# Patient Record
Sex: Female | Born: 1940 | Race: White | Hispanic: No | Smoking: Former smoker
Health system: Southern US, Community
[De-identification: ages and names within clinical notes are randomized; demographics above are authoritative.]

## PROBLEM LIST (undated history)

## (undated) DIAGNOSIS — I10 Essential (primary) hypertension: Secondary | ICD-10-CM

## (undated) DIAGNOSIS — H3552 Pigmentary retinal dystrophy: Secondary | ICD-10-CM

## (undated) DIAGNOSIS — G4700 Insomnia, unspecified: Secondary | ICD-10-CM

## (undated) DIAGNOSIS — G4730 Sleep apnea, unspecified: Secondary | ICD-10-CM

## (undated) DIAGNOSIS — K589 Irritable bowel syndrome without diarrhea: Secondary | ICD-10-CM

## (undated) DIAGNOSIS — E785 Hyperlipidemia, unspecified: Secondary | ICD-10-CM

## (undated) DIAGNOSIS — T7840XA Allergy, unspecified, initial encounter: Secondary | ICD-10-CM

## (undated) DIAGNOSIS — E538 Deficiency of other specified B group vitamins: Secondary | ICD-10-CM

## (undated) DIAGNOSIS — F329 Major depressive disorder, single episode, unspecified: Secondary | ICD-10-CM

## (undated) DIAGNOSIS — F419 Anxiety disorder, unspecified: Secondary | ICD-10-CM

## (undated) DIAGNOSIS — F32A Depression, unspecified: Secondary | ICD-10-CM

## (undated) DIAGNOSIS — G459 Transient cerebral ischemic attack, unspecified: Secondary | ICD-10-CM

## (undated) DIAGNOSIS — G2581 Restless legs syndrome: Secondary | ICD-10-CM

## (undated) DIAGNOSIS — G629 Polyneuropathy, unspecified: Secondary | ICD-10-CM

## (undated) DIAGNOSIS — M1990 Unspecified osteoarthritis, unspecified site: Secondary | ICD-10-CM

## (undated) DIAGNOSIS — K219 Gastro-esophageal reflux disease without esophagitis: Secondary | ICD-10-CM

## (undated) HISTORY — PX: OTHER SURGICAL HISTORY: SHX169

## (undated) HISTORY — PX: TOTAL ABDOMINAL HYSTERECTOMY: SHX209

## (undated) HISTORY — DX: Hyperlipidemia, unspecified: E78.5

## (undated) HISTORY — PX: LEFT OOPHORECTOMY: SHX1961

## (undated) HISTORY — DX: Allergy, unspecified, initial encounter: T78.40XA

## (undated) HISTORY — DX: Pigmentary retinal dystrophy: H35.52

## (undated) HISTORY — DX: Gastro-esophageal reflux disease without esophagitis: K21.9

## (undated) HISTORY — DX: Essential (primary) hypertension: I10

## (undated) HISTORY — DX: Anxiety disorder, unspecified: F41.9

## (undated) HISTORY — DX: Restless legs syndrome: G25.81

## (undated) HISTORY — DX: Insomnia, unspecified: G47.00

## (undated) HISTORY — DX: Deficiency of other specified B group vitamins: E53.8

## (undated) HISTORY — DX: Transient cerebral ischemic attack, unspecified: G45.9

## (undated) HISTORY — DX: Sleep apnea, unspecified: G47.30

## (undated) HISTORY — DX: Depression, unspecified: F32.A

## (undated) HISTORY — DX: Irritable bowel syndrome, unspecified: K58.9

## (undated) HISTORY — DX: Polyneuropathy, unspecified: G62.9

## (undated) HISTORY — DX: Major depressive disorder, single episode, unspecified: F32.9

## (undated) HISTORY — DX: Unspecified osteoarthritis, unspecified site: M19.90

---

## 1998-06-21 HISTORY — PX: CHOLECYSTECTOMY: SHX55

## 2003-07-02 ENCOUNTER — Ambulatory Visit (HOSPITAL_COMMUNITY): Admission: RE | Admit: 2003-07-02 | Discharge: 2003-07-02 | Payer: Self-pay | Admitting: Internal Medicine

## 2004-09-10 ENCOUNTER — Ambulatory Visit: Payer: Self-pay | Admitting: Internal Medicine

## 2004-12-07 ENCOUNTER — Ambulatory Visit: Payer: Self-pay | Admitting: Internal Medicine

## 2006-01-20 ENCOUNTER — Ambulatory Visit: Payer: Self-pay | Admitting: Internal Medicine

## 2010-09-22 ENCOUNTER — Ambulatory Visit (INDEPENDENT_AMBULATORY_CARE_PROVIDER_SITE_OTHER): Payer: Medicare Other | Admitting: Urgent Care

## 2010-09-22 ENCOUNTER — Encounter: Payer: Self-pay | Admitting: Urgent Care

## 2010-09-22 VITALS — BP 123/77 | HR 73 | Temp 98.8°F | Ht 62.0 in | Wt 193.0 lb

## 2010-09-22 DIAGNOSIS — K219 Gastro-esophageal reflux disease without esophagitis: Secondary | ICD-10-CM

## 2010-09-22 DIAGNOSIS — K589 Irritable bowel syndrome without diarrhea: Secondary | ICD-10-CM

## 2010-09-22 LAB — CBC
MCH: 29.8
MCHC: 34
MPV: 8.4
platelet count: 298

## 2010-09-22 LAB — COMPREHENSIVE METABOLIC PANEL
ALT: 20 U/L (ref 7–35)
Albumin: 4.1
Anion Gap: 11 mmol/L (ref ?–30)
CO2: 29 mmol/L
Creat: 0.75
Glucose: 95
HDL: 47 mg/dL (ref 35–70)
Potassium: 4.3 mmol/L
Sodium: 143 mmol/L (ref 137–147)
Total Bilirubin: 1.3 mg/dL
Triglycerides: 94

## 2010-09-22 NOTE — Patient Instructions (Signed)
Start ALIGN once daily Start BENEFIBER as directed Use MIRALAX 17 grams as needed if no BM in 2 days    Irritable Bowel Syndrome (Spastic Colon) Irritable Bowel Syndrome (IBS) is caused by a disturbance of normal bowel function. Other terms used are spastic colon, mucous colitis, and irritable colon. It does not require surgery, nor does it lead to cancer. There is no cure for IBS. But with proper diet, stress reduction, and medication, you will find that your problems (symptoms) will gradually disappear or improve. IBS is a common digestive disorder. It usually appears in late adolescence or early adulthood. Women develop it twice as often as men. CAUSES After food has been digested and absorbed in the small intestine, waste material is moved into the colon (large intestine). In the colon, water and salts are absorbed from the undigested products coming from the small intestine. The remaining residue, or fecal material, is held for elimination. Under normal circumstances, gentle, rhythmic contractions on the bowel walls push the fecal material along the colon towards the rectum. In IBS, however, these contractions are irregular and poorly coordinated. The fecal material is either retained too long, resulting in constipation, or expelled too soon, producing diarrhea. SYMPTOMS  The most common symptom of IBS is pain. It is typically in the lower left side of the belly (abdomen). But it may occur anywhere in the abdomen. It can be felt as heartburn, backache, or even as a dull pain in the arms or shoulders. The pain comes from excessive bowel-muscle spasms and from the buildup of gas and fecal material in the colon. This pain:  Can range from sharp belly (abdominal) cramps to a dull, continuous ache.   Usually worsens soon after eating.   Is typically relieved by having a bowel movement or passing gas.  Abdominal pain is usually accompanied by constipation. But it may also produce diarrhea. The  diarrhea typically occurs right after a meal or upon arising in the morning. The stools are typically soft and watery. They are often flecked with secretions (mucus). Other symptoms of IBS include:  Bloating.  Loss of appetite.   Heartburn.  Feeling sick to your stomach  (nausea).   Belching  Vomiting   Gas.  IBS may also cause a number of symptoms that are unrelated to the digestive system:  Fatigue.  Headaches.   Anxiety  Shortness of breath   Difficulty in concentrating.  Dizziness.   These symptoms tend to come and go. DIAGNOSIS The symptoms of IBS closely mimic the symptoms of other, more serious digestive disorders. So your caregiver may wish to perform a variety of additional tests to exclude these disorders. He/she wants to be certain of learning what is wrong (diagnosis). The nature and purpose of each test will be explained to you. TREATMENT A number of medications are available to help correct bowel function and/or relieve bowel spasms and abdominal pain. Among the drugs available are:  Mild, non-irritating laxatives for severe constipation and to help restore normal bowel habits.   Specific anti-diarrheal medications to treat severe or prolonged diarrhea.   Anti-spasmodic agents to relieve intestinal cramps.   Your caregiver may also decide to treat you with a mild tranquilizer or sedative during unusually stressful periods in your life.  The important thing to remember is that if any drug is prescribed for you, make sure that you take it exactly as directed. Make sure that your caregiver knows how well it worked for you. HOME CARE INSTRUCTIONS  Avoid foods that are high in fat or oils. Some examples WGN:FAOZH cream, butter, frankfurters, sausage, and other fatty meats.   Avoid foods that have a laxative effect, such as fruit, fruit juice, and dairy products.   Cut out carbonated drinks, chewing gum, and "gassy" foods, such as beans and cabbage. This may help  relieve bloating and belching.   Bran taken with plenty of liquids may help relieve constipation.   Keep track of what foods seem to trigger your symptoms.   Avoid emotionally charged situations or circumstances that produce anxiety.   Start or continue exercising.   Get plenty of rest and sleep.  MAKE SURE YOU:   Understand these instructions.   Will watch your condition.   Will get help right away if you are not doing well or get worse.  Document Released: 06/07/2005 Document Re-Released: 10/24/2008 [facility] Patient Information 2011 [place], [place].Diet & IBS  (Irritable Bowel Syndrome) No cure has been found for IBS. Many options are available to treat the symptoms. Your caregivers will give you the best treatments available for your symptoms. They will also encourage you to manage stress and make changes to your diet. You need to work with your doctor and Registered Dietician to find the best combination of medicine, diet, counseling, and support to control your symptoms. The following are some diet suggestions. SOME FOOD MAKE IBS WORSE These foods are:  Fatty foods like french fries.   Milk products like cheese or ice cream.   Chocolate.   Alcohol.   Caffeine (found in coffee and some sodas).   Carbonated drinks like soda.  If certain foods cause symptoms, you should eat less of them or stop eating them. CAREFUL EATING REDUCES IBS SYMPTOMS.   Before changing your diet, keep a journal of the foods that seem to cause distress. Then discuss your findings with your caregivers. Write down this information:   What you are eating during the day and when.   What problems are you having after eating?   When do the symptoms occur in relation to your meals?   What foods always make you feel badly?   Take your notes with you to the caregiver to see if you should stop eating certain foods.  SOME FOODS MAKE IBS BETTER. Fiber reduces IBS symptoms, especially constipation,  because it makes stools soft, bulky, and easier to pass. Fiber is found in bran, bread, cereal, beans, fruit, and vegetables.  Some examples of foods with fiber:  Fruits.   Apples.   Peaches.   Pears.   Berries.   Figs.    Vegetables.   Broccoli, raw.   Cabbage.   Carrots.  Raw peas.    Breads, cereals, and beans.   Kidney beans.  Lima beans.   Whole-grain bread.  Whole-grain cereal.    Add foods with fiber to your diet a little at a time. This will let your body get used to them. Too much fiber at once might cause gas and swelling of your belly (abdomen). This can trigger symptoms in a person with IBS.   Caregivers usually recommend a diet with enough fiber to produce soft, painless bowel movements. High-fiber diets may cause gas and bloating. These symptoms often go away within a few weeks as your body adjusts. In many cases, dietary fiber may lessen IBS symptoms, particularly constipation. However, it may not help pain or diarrhea. High-fiber diets keep the colon mildly distended (enlarged with the added fiber). This may help prevent spasms  in the colon. Some forms of fiber also keep water in the stool, thereby preventing hard stools that are difficult to pass.   Besides telling you to eat more foods with fiber, your caregiver may also tell you to get more fiber by taking a fiber pill or drinking water mixed with a special high-fiber powder. An example of this is a natural fiber laxative containing psyllium seed.  HOW MUCH YOU EAT IS IMPORTANT:  Large meals can cause cramping and diarrhea in people with IBS.   If this happens to you, try eating 4 or 5 small meals a day.   Or, have your usual 3 meals, but eat less at each meal.   It may also help if your meals are low in fat and high in carbohydrates. Examples of carbohydrates are pasta, rice, whole-grain breads and cereals, fruits and vegetables.   If dairy products cause your symptoms to flare up, you can try eating  less of those foods.   You might be able to handle yogurt better than other dairy products because it contains bacteria that helps with digestion.   Dairy products are an important source of calcium and other nutrients. If you need to avoid dairy products, be sure to talk with a Registered Dietitian about getting these nutrients through other food sources.   Drinking 6 to 8 glasses of plain water a day is important, especially if you have diarrhea.  *You may want to consult a Registered Dietitian who can help you make changes to your diet.  FOR MORE INFORMATION [employer] [address] [place], [place] [address] Email: iffgd@iffgd .org  Internet: www.iffgd.org  National Digestive Diseases Information Clearinghouse (NIDDK) [address] [place], [place] [address] Email: nddic@info .[employer].si  Document Released: 08/28/2003 Document Re-Released: 11/25/2009 [facility] Patient Information 2011 [place], [place].

## 2010-09-22 NOTE — Assessment & Plan Note (Signed)
Well controlled on omeprazole 20mg daily.  °

## 2010-09-22 NOTE — Assessment & Plan Note (Signed)
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 We discussed her symptoms in great detail today and I detect no alarm features. Given these findings, would proceed with conservative treatment for IBS. If she is no better we'll consider further evaluation at that time. Last colonoscopy and EGD 2005. There is no family history of GI malignancy.   Treatment with aligned probiotic, MiraLax as needed for no bowel in it in 2 days, and daily fiber in the way of Benefiber. Instructions given for IBS diet and IBS literature for her review. Will followup in 6 weeks to assess her progress.

## 2010-09-22 NOTE — Progress Notes (Signed)
Referring Provider: [facility] Primary Care Physician:  Kirstie Peri, MD  Chief Complaint  Patient presents with  . Irritable Bowel Syndrome  . Gastrophageal Reflux    HPI:  Anna Jacobs is a 70 y.o. female here as a referral from Dr. Sherryll Burger for IBS/GERD.  "Every time I eat food settles in my upper abd".  C/o "tightness" in abd, within 15 mins "like dysentery" with watery stools 2-3 times.  C/o pruritis from hemorrhoids.  Alternates w/ constipation w/ BM q 2-3 days.  Some straining w/ stools.  Denies rectal bleeding, melena or mucous in stools.  Weight stable.  Some anorexia due to fear of having to go to bathroom.  Quit taking miralax daily because of loose stools & felt like ot was irritating stomach.  C/o transient nausea before or after eating without emesis.  Denies fever/chills.  Taking omeprazole 20mg  daily x 1 mo.  Denies heartburn, indigestion, but has had some transient chest pain.  Denies alarm features. Last colonoscopy and EGD by Dr. Jena Gauss January 2005.   Past Medical History  Diagnosis Date  . Parkinson's disease 2001  . Restless leg syndrome   . RP (retinitis pigmentosa)   . Anxiety   . Sleep apnea   . Hearing loss   . Hyperlipemia   . Hypertension   . Insomnia   . Degenerative joint disease   . Peripheral neuropathy   . B12 deficiency   . Depression   . Urinary incontinence   . TIA (transient ischemic attack)   . GERD (gastroesophageal reflux disease)     EGD 07/02/2003 Dr. Jena Gauss  . IBS (irritable bowel syndrome)     Colonoscopy normal Dr. Jena Gauss 06/2003    Past Surgical History  Procedure Date  . Cholecystectomy 2000  . Total abdominal hysterectomy   . Benign breast biopsy   . Left oophorectomy     Current Outpatient Prescriptions  Medication Sig Dispense Refill  . ALPRAZolam (XANAX) 0.5 MG tablet Take 1 mg by mouth 2 (two) times daily as needed.        Marland Kitchen atorvastatin (LIPITOR) 40 MG tablet Take 40 mg by mouth daily.        . carbidopa-levodopa (SINEMET) 25-100  MG per tablet Take 1 tablet by mouth 2 (two) times daily as needed.        Marland Kitchen escitalopram (LEXAPRO) 20 MG tablet Take 20 mg by mouth daily.        . meclizine (ANTIVERT) 12.5 MG tablet Take 12.5 mg by mouth 3 (three) times daily as needed.        Marland Kitchen omeprazole (PRILOSEC) 20 MG capsule Take 20 mg by mouth daily.        . polyethylene glycol (MIRALAX / GLYCOLAX) packet Take 17 g by mouth daily as needed.          Allergies as of 09/22/2010 - Review Complete 09/22/2010  Allergen Reaction Noted  . Flagyl (metronidazole hcl) Itching and Rash 09/22/2010    Family History  Problem Relation Age of Onset  . Lung cancer Brother   . Ovarian cancer Daughter 41  . Lymphoma Mother   . Coronary artery disease Father    History   Social History  . Marital Status: Divorced    Spouse Name: N/A    Number of Children: 3  . Years of Education: N/A   Occupational History  . disabled     Previously worked at [employer] History Main Topics  . Smoking status: Never Smoker   .  Smokeless tobacco: Never Used  . Alcohol Use: No  . Drug Use: No  . Sexually Active: No   Review of Systems: Gen: Denies any fever, chills, sweats, anorexia, fatigue, weakness, malaise, weight loss, and sleep disorder CV: Denies chest pain, angina, palpitations, syncope, orthopnea, PND, peripheral edema, and claudication. Resp: Denies dyspnea at rest, dyspnea with exercise, cough, sputum, wheezing, coughing up blood, and pleurisy. GI: Denies vomiting blood, jaundice, and fecal incontinence.   Denies dysphagia or odynophagia. GU : Denies urinary burning, blood in urine, urinary frequency, urinary hesitancy, nocturnal urination, and urinary incontinence. MS: Denies joint pain, limitation of movement, and swelling, stiffness, low back pain, extremity pain. Denies muscle weakness, cramps, atrophy.  Derm: Denies rash, itching, dry skin, hives, moles, warts, or unhealing ulcers.  Psych: Denies depression, anxiety, memory  loss, suicidal ideation, hallucinations, paranoia, and confusion. Heme: Denies bruising, bleeding, and enlarged lymph nodes.  Physical Exam: BP 123/77  Pulse 73  Temp 98.8 F (37.1 C)  Ht 5\' 2"  (1.575 m)  Wt 193 lb (87.544 kg)  BMI 35.30 kg/m2  SpO2 96% General:   Alert,  Well-developed, well-nourished, pleasant and cooperative in NAD Head:  Normocephalic and atraumatic. Eyes:  Sclera clear, no icterus.   Conjunctiva pink. Ears:  Normal auditory acuity. Nose:  No deformity, discharge,  or lesions. Mouth:  No deformity or lesions, dentition normal. Neck:  Supple; no masses or thyromegaly. Lungs:  Clear throughout to auscultation.   No wheezes, crackles, or rhonchi. No acute distress. Heart:  Regular rate and rhythm; no murmurs, clicks, rubs,  or gallops. Abdomen:  Obese. Soft, nontender and nondistended. No masses, hepatosplenomegaly or hernias noted. Normal bowel sounds, without guarding, and without rebound.   Rectal:  Deferred until time of colonoscopy.   Msk:  Symmetrical without gross deformities. Normal posture. Pulses:  Normal pulses noted. Extremities:  Without clubbing or edema. Neurologic:  Alert and  oriented x4;  grossly normal neurologically. Skin:  Intact without significant lesions or rashes. Cervical Nodes:  No significant cervical adenopathy. Psych:  Alert and cooperative. Normal mood and affect.  Labs reviewed from [facility] on 08/24/10 Lidoderm 1.3 otherwise normal LFTs, normal CMP except BUN 22, CO2 29, an anion gap of 11.3. Normal TSH. Normal CBC.

## 2010-10-16 ENCOUNTER — Encounter: Payer: Self-pay | Admitting: Urgent Care

## 2010-11-06 ENCOUNTER — Ambulatory Visit: Payer: Medicare Other | Admitting: Internal Medicine

## 2010-12-04 ENCOUNTER — Ambulatory Visit (INDEPENDENT_AMBULATORY_CARE_PROVIDER_SITE_OTHER): Payer: Medicare Other | Admitting: Internal Medicine

## 2010-12-04 ENCOUNTER — Encounter: Payer: Self-pay | Admitting: Internal Medicine

## 2010-12-04 VITALS — BP 118/76 | HR 83 | Temp 98.0°F | Ht 62.0 in | Wt 184.8 lb

## 2010-12-04 DIAGNOSIS — K589 Irritable bowel syndrome without diarrhea: Secondary | ICD-10-CM

## 2010-12-04 DIAGNOSIS — K582 Mixed irritable bowel syndrome: Secondary | ICD-10-CM

## 2010-12-04 NOTE — Progress Notes (Signed)
Primary Care Physician:  Kirstie Peri, MD  Primary Gastroenterologist:  Dr. Jena Gauss  Chief Complaint  Patient presents with  . Follow-up    trouble going to the bathroom    HPI:  Anna Jacobs is a 70 y.o. female here for followup of irritable  bowel syndrome symptoms. The patient notes wide swings in bowel function. She states she has multiple episodes of diarrhea about a week and a half out of every month. She could not afford probiotic therapy. She does take Benefiber 1 tablespoon daily. She tells me she has been following the label instructions on her prescription MiraLax exactly. She reports these instructions include 2 capfuls of MiraLax every day. Consequently, she has been taking MiraLax every day without fail - even during periods of diarrhea. She has not had any rectal bleeding. Negative colonoscopy 2005. No family history of CRC. She may particularly have episodes of diarrhea when shegoes to the grocery store and  he goes out to eat. At other times, she may get 3-4 days without a bowel movement.  Past Medical History  Diagnosis Date  . Parkinson's disease 2001  . Restless leg syndrome   . RP (retinitis pigmentosa)   . Anxiety   . Sleep apnea   . Hearing loss   . Hyperlipemia   . Hypertension   . Insomnia   . Degenerative joint disease   . Peripheral neuropathy   . B12 deficiency   . Depression   . Urinary incontinence   . TIA (transient ischemic attack)   . GERD (gastroesophageal reflux disease)     EGD 07/02/2003 Dr. Jena Gauss  . IBS (irritable bowel syndrome)     Colonoscopy normal Dr. Jena Gauss 06/2003    Past Surgical History  Procedure Date  . Cholecystectomy 2000  . Total abdominal hysterectomy   . Benign breast biopsy   . Left oophorectomy     Current Outpatient Prescriptions  Medication Sig Dispense Refill  . ALPRAZolam (XANAX) 0.5 MG tablet Take 1 mg by mouth 2 (two) times daily as needed.        Marland Kitchen atorvastatin (LIPITOR) 40 MG tablet Take 40 mg by mouth daily.         . carbidopa-levodopa (SINEMET) 25-100 MG per tablet Take 1 tablet by mouth 2 (two) times daily as needed.        Marland Kitchen escitalopram (LEXAPRO) 20 MG tablet Take 20 mg by mouth daily.        . meclizine (ANTIVERT) 12.5 MG tablet Take 12.5 mg by mouth 3 (three) times daily as needed.        Marland Kitchen omeprazole (PRILOSEC) 20 MG capsule Take 20 mg by mouth daily.        . polyethylene glycol (MIRALAX / GLYCOLAX) packet Take 17 g by mouth daily as needed.          Allergies as of 12/04/2010 - Review Complete 12/04/2010  Allergen Reaction Noted  . Flagyl (metronidazole hcl) Itching and Rash 09/22/2010    Family History  Problem Relation Age of Onset  . Lung cancer Brother   . Ovarian cancer Daughter 83  . Lymphoma Mother   . Coronary artery disease Father     History   Social History  . Marital Status: Divorced    Spouse Name: N/A    Number of Children: 3  . Years of Education: N/A   Occupational History  . disabled     Previously worked at [employer] History Main Topics  . Smoking  status: Never Smoker   . Smokeless tobacco: Never Used  . Alcohol Use: No  . Drug Use: No  . Sexually Active: No   Other Topics Concern  . Not on file   Social History Narrative  . No narrative on file      ROS:  General: Negative for anorexia, weight loss, fever, chills, fatigue, weakness. Eyes: Negative for vision changes.  ENT: Negative for hoarseness, difficulty swallowing , nasal congestion. CV: Negative for chest pain, angina, palpitations, dyspnea on exertion, peripheral edema.  Respiratory: Negative for dyspnea at rest, dyspnea on exertion, cough, sputum, wheezing.  GI: See history of present illness. GU:  Negative for dysuria, hematuria, urinary incontinence, urinary frequency, nocturnal urination.  MS: Negative for joint pain, low back pain.  Derm: Negative for rash or itching.  Neuro: Negative for weakness, abnormal sensation, seizure, frequent headaches, memory loss,  confusion.  Psych: Negative for anxiety, depression, suicidal ideation, hallucinations.  Endo: Negative for unusual weight change.  Heme: Negative for bruising or bleeding. Allergy: Negative for rash or hives.    Physical Examination: BP 118/76  Pulse 83  Temp(Src) 98 F (36.7 C) (Temporal)  Ht 5\' 2"  (1.575 m)  Wt 184 lb 12.8 oz (83.825 kg)  BMI 33.80 kg/m2   General: Well-nourished, well-developed in no acute distress.  Head: Normocephalic, atraumatic.   Eyes: Conjunctiva pink, no icterus. Mouth: Oropharyngeal mucosa moist and pink , no lesions erythema or exudate. Neck: Supple without thyromegaly, masses, or lymphadenopathy.  Lungs: Clear to auscultation bilaterally.  Heart: Regular rate and rhythm, no murmurs rubs or gallops.  Abdomen: Bowel sounds are normal, nontender, nondistended, no hepatosplenomegaly or masses, no abdominal bruits or    hernia , no rebound or guarding.   Extremities: No lower extremity edema.  Neuro: Alert and oriented x 4 , grossly normal neurologically.  Skin: Warm and dry, no rash or jaundice.   Psych: Alert and cooperative, normal mood and affect.

## 2010-12-04 NOTE — Assessment & Plan Note (Signed)
Very pleasant 70 year old lady with classic alternating patient consist with irritable bowel syndrome. She has as much constipation as diarrhea. She is taking MiraLax as a scheduled medication even through periods of diarrhea. I do not detect any alarm symptoms.  Recommendations: I spent a lengthy period of time going over the benign chronic nature of irritable bowel syndrome with this nice lady  I made the following specific recommendations: Take only one capful of MiraLax daily at bedtime when necessary any given day without a bowel movement. She has a reasonably good bowel movement he given day she should leave MiraLax off. I've asked her to continue Benefiber 1 tablespoon daily. Since she cannot afford a probiotic therapy and for criteria for, I've recommended a serving of yogurt every day for a long hall.  Finally, I recommended this nicely keep a stool diary.. She states there are times when she knows she's going to have diarrhea such as going out to eat or going to the grocery store.  I suggested she take a single Imodium right ear and how for these activities.  Plan see this nice lady back in the office in 8-10 weeks for followup.

## 2010-12-11 ENCOUNTER — Encounter: Payer: Self-pay | Admitting: Internal Medicine

## 2011-01-29 ENCOUNTER — Ambulatory Visit: Payer: Medicare Other | Admitting: Gastroenterology

## 2011-01-29 ENCOUNTER — Ambulatory Visit: Payer: Medicare Other | Admitting: Urgent Care

## 2011-02-01 ENCOUNTER — Ambulatory Visit: Payer: Medicare Other | Admitting: Gastroenterology

## 2012-06-20 ENCOUNTER — Other Ambulatory Visit: Payer: Self-pay | Admitting: Internal Medicine

## 2012-06-20 DIAGNOSIS — R921 Mammographic calcification found on diagnostic imaging of breast: Secondary | ICD-10-CM

## 2012-07-03 ENCOUNTER — Ambulatory Visit
Admission: RE | Admit: 2012-07-03 | Discharge: 2012-07-03 | Disposition: A | Discharge status: Home / Self Care | Payer: PRIVATE HEALTH INSURANCE | Source: Ambulatory Visit | Attending: Internal Medicine | Admitting: Internal Medicine

## 2012-07-03 DIAGNOSIS — R921 Mammographic calcification found on diagnostic imaging of breast: Secondary | ICD-10-CM

## 2012-10-26 ENCOUNTER — Telehealth: Payer: Self-pay | Admitting: General Practice

## 2012-10-26 NOTE — Telephone Encounter (Signed)
Patient called to schedule tcs, however she is having some problems and will need an ov.

## 2012-11-21 ENCOUNTER — Ambulatory Visit (INDEPENDENT_AMBULATORY_CARE_PROVIDER_SITE_OTHER): Payer: PRIVATE HEALTH INSURANCE | Admitting: Gastroenterology

## 2012-11-21 ENCOUNTER — Encounter: Payer: Self-pay | Admitting: Gastroenterology

## 2012-11-21 VITALS — BP 136/82 | HR 70 | Temp 98.2°F | Ht 62.0 in | Wt 168.0 lb

## 2012-11-21 DIAGNOSIS — R143 Flatulence: Secondary | ICD-10-CM

## 2012-11-21 DIAGNOSIS — R159 Full incontinence of feces: Secondary | ICD-10-CM | POA: Insufficient documentation

## 2012-11-21 DIAGNOSIS — R197 Diarrhea, unspecified: Secondary | ICD-10-CM

## 2012-11-21 DIAGNOSIS — R109 Unspecified abdominal pain: Secondary | ICD-10-CM

## 2012-11-21 DIAGNOSIS — R198 Other specified symptoms and signs involving the digestive system and abdomen: Secondary | ICD-10-CM

## 2012-11-21 DIAGNOSIS — R140 Abdominal distension (gaseous): Secondary | ICD-10-CM

## 2012-11-21 DIAGNOSIS — R194 Change in bowel habit: Secondary | ICD-10-CM

## 2012-11-21 DIAGNOSIS — R1030 Lower abdominal pain, unspecified: Secondary | ICD-10-CM | POA: Insufficient documentation

## 2012-11-21 MED ORDER — PEG 3350-KCL-NA BICARB-NACL 420 G PO SOLR: 4000.0000 mL | ORAL | Status: DC

## 2012-11-21 NOTE — Assessment & Plan Note (Signed)
72 year old lady with diagnosis of irritable bowel syndrome who presents today with complaints of worsening of her symptoms. She would like to proceed with her colonoscopy at this time as it has been nearly 10 years since her last one. She describes having more frequent episodes of postprandial fecal urgency and incontinence. Less constipation than before. Recalls several years ago that she never had any of these problems. Did not appreciate any improvement with Benefiber and has subsequently come off the medication. Tried Activia for a period of time without any results. Currently takes MiraLax only when she needs it. Rarely has to take it. She is concerned she may have Crohn's disease because her sister-in-law has a family member with it. Planes a lower abdominal pain associated with her bowel movements. Complains of abdominal bloating after meals. Typical GERD symptoms well controlled on Prilosec.  1. Check for celiac disease via TTG. 2. Ileocolonoscopy with random colon biopsies in the near future with Dr. Jena Gauss.  I have discussed the risks, alternatives, benefits with regards to but not limited to the risk of reaction to medication, bleeding, infection, perforation and the patient is agreeable to proceed. Written consent to be obtained. 3. Evaluate hemorrhoids at time of procedure to see if she may be a candidate for banding in the near future. Dr. Jena Gauss to make recommendations at her colonoscopy. 4. Consider antispasmodic after procedure based on findings.

## 2012-11-21 NOTE — Progress Notes (Signed)
CC PCP 

## 2012-11-21 NOTE — Patient Instructions (Addendum)
1. Please have your blood work done today. 2. We have scheduled you for a colonoscopy with Dr. Jena Gauss. Please see separate instructions.

## 2012-11-21 NOTE — Progress Notes (Signed)
Primary Care Physician:  Kirstie Peri, MD  Primary Gastroenterologist:  Roetta Sessions, MD   Chief Complaint  Patient presents with  . Colonoscopy    HPI:  Anna Jacobs is a 72 y.o. female here to schedule colonoscopy. She was last seen in June 2012. Negative colonoscopy in 2005. No family history of colon cancer. Personal history of irritable bowel syndrome.   Doesn't feel like Activia or Miralax help. Stopped Benefiber inadvertently, states it didn't seem to help.   PP fecal urgency with periods of incontinence. Afraid to eat and afraid to go out. Leaks stool with any significant activity. Rare normal BM. Sometimes constipation, almost equal, really depends on what eats. Used to have just normal bowel movements until several years ago. Recent severe diarrhea with milkshake. Drink milk with cereal and seems to do okay. Backed off on dark sodas. No melena, brbpr but eyesight so bad. No heartburn on Prilosec. No dysphagia. Lots of lower abdominal discomfort, bloating. Hemorrhoid pads. Sitzs baths. Hemorrhoids come out after BM. Has to lay on side after BM sometimes due to severe hemorrhoid pain.   Current Outpatient Prescriptions  Medication Sig Dispense Refill  . ALPRAZolam (XANAX) 0.5 MG tablet Take 1 mg by mouth 2 (two) times daily as needed.        Marland Kitchen atorvastatin (LIPITOR) 40 MG tablet Take 40 mg by mouth daily.        . carbidopa-levodopa (SINEMET) 25-100 MG per tablet Take 1 tablet by mouth 2 (two) times daily as needed.        Marland Kitchen escitalopram (LEXAPRO) 20 MG tablet Take 20 mg by mouth daily.        Marland Kitchen omeprazole (PRILOSEC) 20 MG capsule Take 20 mg by mouth daily.        . polyethylene glycol (MIRALAX / GLYCOLAX) packet Take 17 g by mouth daily as needed.         No current facility-administered medications for this visit.    Allergies as of 11/21/2012 - Review Complete 11/21/2012  Allergen Reaction Noted  . Flagyl (metronidazole hcl) Itching and Rash 09/22/2010    Past Medical  History  Diagnosis Date  . Parkinson's disease 2001  . Restless leg syndrome   . RP (retinitis pigmentosa)   . Anxiety   . Sleep apnea   . Hearing loss   . Hyperlipemia   . Hypertension   . Insomnia   . Degenerative joint disease   . Peripheral neuropathy   . B12 deficiency   . Depression   . Urinary incontinence   . TIA (transient ischemic attack)   . GERD (gastroesophageal reflux disease)     EGD 07/02/2003 Dr. Jena Gauss  . IBS (irritable bowel syndrome)     Colonoscopy normal Dr. Jena Gauss 06/2003    Past Surgical History  Procedure Laterality Date  . Cholecystectomy  2000  . Total abdominal hysterectomy    . Benign breast biopsy      left (remotely) and right (06/2012)  . Left oophorectomy      Family History  Problem Relation Age of Onset  . Lung cancer Brother   . Ovarian cancer Daughter 67  . Lymphoma Mother   . Coronary artery disease Father   . Colon cancer Neg Hx     History   Social History  . Marital Status: Divorced    Spouse Name: N/A    Number of Children: 3  . Years of Education: N/A   Occupational History  . disabled  Previously worked at [employer] History Main Topics  . Smoking status: Never Smoker   . Smokeless tobacco: Never Used  . Alcohol Use: No  . Drug Use: No  . Sexually Active: No   Other Topics Concern  . Not on file   Social History Narrative  . No narrative on file      ROS:  General: Negative for anorexia, weight loss, fever, chills, fatigue, weakness. Eyes: chronic poor eye sight.  ENT: Negative for hoarseness, difficulty swallowing , nasal congestion. CV: Negative for chest pain, angina, palpitations, dyspnea on exertion, peripheral edema.  Respiratory: Negative for dyspnea at rest, dyspnea on exertion, cough, sputum, wheezing.  GI: See history of present illness. GU:  Negative for dysuria, hematuria, urinary incontinence, urinary frequency, nocturnal urination.  MS: Negative for joint pain, low back pain.   Derm: Negative for rash or itching.  Neuro: Negative for weakness, abnormal sensation, seizure, frequent headaches, memory loss, confusion.  Psych: Negative for anxiety, depression, suicidal ideation, hallucinations.  Endo: Negative for unusual weight change.  Heme: Negative for bruising or bleeding. Allergy: Negative for rash or hives.    Physical Examination:  BP 136/82  Pulse 70  Temp(Src) 98.2 F (36.8 C) (Oral)  Ht 5\' 2"  (1.575 m)  Wt 168 lb (76.204 kg)  BMI 30.72 kg/m2   General: Well-nourished, well-developed in no acute distress.  Head: Normocephalic, atraumatic.   Eyes: Conjunctiva pink, no icterus. Mouth: Oropharyngeal mucosa moist and pink , no lesions erythema or exudate. Neck: Supple without thyromegaly, masses, or lymphadenopathy.  Lungs: Clear to auscultation bilaterally.  Heart: Regular rate and rhythm, no murmurs rubs or gallops.  Abdomen: Bowel sounds are normal, vague mild lower abdominal tenderness, nondistended, no hepatosplenomegaly or masses, no abdominal bruits or    hernia , no rebound or guarding.   Rectal: defer Extremities: No lower extremity edema. No clubbing or deformities.  Neuro: Alert and oriented x 4 , grossly normal neurologically.  Skin: Warm and dry, no rash or jaundice.   Psych: Alert and cooperative, normal mood and affect.

## 2012-11-22 ENCOUNTER — Encounter (HOSPITAL_COMMUNITY): Payer: Self-pay | Admitting: Pharmacy Technician

## 2012-11-29 ENCOUNTER — Encounter (HOSPITAL_COMMUNITY): Payer: Self-pay | Admitting: *Deleted

## 2012-11-29 ENCOUNTER — Encounter (HOSPITAL_COMMUNITY)
Admission: RE | Disposition: A | Discharge status: Home / Self Care | Payer: Self-pay | Source: Ambulatory Visit | Attending: Internal Medicine

## 2012-11-29 ENCOUNTER — Ambulatory Visit (HOSPITAL_COMMUNITY)
Admission: RE | Admit: 2012-11-29 | Discharge: 2012-11-29 | Disposition: A | Discharge status: Home / Self Care | Payer: PRIVATE HEALTH INSURANCE | Source: Ambulatory Visit | Attending: Internal Medicine | Admitting: Internal Medicine

## 2012-11-29 DIAGNOSIS — K620 Anal polyp: Secondary | ICD-10-CM

## 2012-11-29 DIAGNOSIS — D128 Benign neoplasm of rectum: Secondary | ICD-10-CM | POA: Insufficient documentation

## 2012-11-29 DIAGNOSIS — R197 Diarrhea, unspecified: Secondary | ICD-10-CM

## 2012-11-29 DIAGNOSIS — R140 Abdominal distension (gaseous): Secondary | ICD-10-CM

## 2012-11-29 DIAGNOSIS — R194 Change in bowel habit: Secondary | ICD-10-CM

## 2012-11-29 DIAGNOSIS — D126 Benign neoplasm of colon, unspecified: Secondary | ICD-10-CM | POA: Insufficient documentation

## 2012-11-29 DIAGNOSIS — G20A1 Parkinson's disease without dyskinesia, without mention of fluctuations: Secondary | ICD-10-CM | POA: Insufficient documentation

## 2012-11-29 DIAGNOSIS — D129 Benign neoplasm of anus and anal canal: Secondary | ICD-10-CM | POA: Insufficient documentation

## 2012-11-29 DIAGNOSIS — R159 Full incontinence of feces: Secondary | ICD-10-CM

## 2012-11-29 DIAGNOSIS — I10 Essential (primary) hypertension: Secondary | ICD-10-CM | POA: Insufficient documentation

## 2012-11-29 DIAGNOSIS — G20 Parkinson's disease: Secondary | ICD-10-CM | POA: Insufficient documentation

## 2012-11-29 DIAGNOSIS — K621 Rectal polyp: Secondary | ICD-10-CM

## 2012-11-29 HISTORY — PX: BIOPSY: SHX5522

## 2012-11-29 HISTORY — PX: COLONOSCOPY: SHX5424

## 2012-11-29 SURGERY — COLONOSCOPY
Anesthesia: Moderate Sedation

## 2012-11-29 MED ORDER — SODIUM CHLORIDE 0.9 % IV SOLN
INTRAVENOUS | Status: DC
  Administered 2012-11-29: 09:00:00 via INTRAVENOUS

## 2012-11-29 MED ORDER — ONDANSETRON HCL 4 MG/2ML IJ SOLN
INTRAMUSCULAR | Status: AC
  Filled 2012-11-29: qty 2

## 2012-11-29 MED ORDER — MIDAZOLAM HCL 5 MG/5ML IJ SOLN
INTRAMUSCULAR | Status: DC | PRN
  Administered 2012-11-29: 1 mg via INTRAVENOUS
  Administered 2012-11-29: 2 mg via INTRAVENOUS

## 2012-11-29 MED ORDER — MIDAZOLAM HCL 5 MG/5ML IJ SOLN
INTRAMUSCULAR | Status: AC
  Filled 2012-11-29: qty 10

## 2012-11-29 MED ORDER — MEPERIDINE HCL 100 MG/ML IJ SOLN
INTRAMUSCULAR | Status: DC | PRN
  Administered 2012-11-29: 50 mg via INTRAVENOUS
  Administered 2012-11-29: 25 mg via INTRAVENOUS

## 2012-11-29 MED ORDER — STERILE WATER FOR IRRIGATION IR SOLN
Status: DC | PRN
  Administered 2012-11-29: 10:00:00

## 2012-11-29 MED ORDER — ONDANSETRON HCL 4 MG/2ML IJ SOLN
INTRAMUSCULAR | Status: DC | PRN
  Administered 2012-11-29: 4 mg via INTRAVENOUS

## 2012-11-29 MED ORDER — MEPERIDINE HCL 100 MG/ML IJ SOLN
INTRAMUSCULAR | Status: AC
  Filled 2012-11-29: qty 1

## 2012-11-29 NOTE — Op Note (Signed)
[facility] [address] [place], [address]   COLONOSCOPY PROCEDURE REPORT  PATIENT: Anna, Jacobs  MR#:         [id number] BIRTHDATE: [birth year] , 71  yrs. old GENDER: Female ENDOSCOPIST: R.  Roetta Sessions, MD FACP FACG REFERRED BY:  Kirstie Peri, M.D. PROCEDURE DATE:  11/29/2012 PROCEDURE:     Ileocolonoscopy with segmental biopsy  INDICATIONS: Chronic diarrhea; colorectal cancer screening  INFORMED CONSENT:  The risks, benefits, alternatives and imponderables including but not limited to bleeding, perforation as well as the possibility of a missed lesion have been reviewed.  The potential for biopsy, lesion removal, etc. have also been discussed.  Questions have been answered.  All parties agreeable. Please see the history and physical in the medical record for more information.  MEDICATIONS: Versed 3 mg IV and Demerol 75 mg IV in divided doses Zofran 4 mg  DESCRIPTION OF PROCEDURE:  After a digital rectal exam was performed, the EC-[id number] ([id number])  colonoscope was advanced from the anus through the rectum and colon to the area of the cecum, ileocecal valve and appendiceal orifice.  The cecum was deeply intubated.  These structures were well-seen and photographed for the record.  From the level of the cecum and ileocecal valve, the scope was slowly and cautiously withdrawn.  The mucosal surfaces were carefully surveyed utilizing scope tip deflection to facilitate fold flattening as needed.  The scope was pulled down into the rectum where a thorough examination including retroflexion was performed.    FINDINGS:  Adequate preparation. (2) diminutive polyps inside the anal verge at 5 cm; otherwise, the remainder of the colonic mucosa appeared normal. The distal 5 cm of terminal ileal mucosa appeared normal.  THERAPEUTIC / DIAGNOSTIC MANEUVERS PERFORMED:  Segmental  biopsies of the ascending and sigmoid segments were taken to evaluate  for microscopic colitis. The  2 diminutive polyps in the rectum were cold biopsied/removed. COMPLICATIONS: None  CECAL WITHDRAWAL TIME:   11 minutes  IMPRESSION:  Diminutive rectal polyps-removed as described above. Otherwise normal examination-status post segmental biopsy  RECOMMENDATIONS: Followup on pathology.   _______________________________ eSigned:  R. Roetta Sessions, MD FACP [facility] 11/29/2012 11:08 AM   CC:

## 2012-11-29 NOTE — H&P (View-Only) (Signed)
Primary Care Physician:  SHAH,ASHISH, MD  Primary Gastroenterologist:  Michael Rourk, MD   Chief Complaint  Patient presents with  . Colonoscopy    HPI:  Anna Jacobs is a 71 y.o. female here to schedule colonoscopy. She was last seen in June 2012. Negative colonoscopy in 2005. No family history of colon cancer. Personal history of irritable bowel syndrome.   Doesn't feel like Activia or Miralax help. Stopped Benefiber inadvertently, states it didn't seem to help.   PP fecal urgency with periods of incontinence. Afraid to eat and afraid to go out. Leaks stool with any significant activity. Rare normal BM. Sometimes constipation, almost equal, really depends on what eats. Used to have just normal bowel movements until several years ago. Recent severe diarrhea with milkshake. Drink milk with cereal and seems to do okay. Backed off on dark sodas. No melena, brbpr but eyesight so bad. No heartburn on Prilosec. No dysphagia. Lots of lower abdominal discomfort, bloating. Hemorrhoid pads. Sitzs baths. Hemorrhoids come out after BM. Has to lay on side after BM sometimes due to severe hemorrhoid pain.   Current Outpatient Prescriptions  Medication Sig Dispense Refill  . ALPRAZolam (XANAX) 0.5 MG tablet Take 1 mg by mouth 2 (two) times daily as needed.        . atorvastatin (LIPITOR) 40 MG tablet Take 40 mg by mouth daily.        . carbidopa-levodopa (SINEMET) 25-100 MG per tablet Take 1 tablet by mouth 2 (two) times daily as needed.        . escitalopram (LEXAPRO) 20 MG tablet Take 20 mg by mouth daily.        . omeprazole (PRILOSEC) 20 MG capsule Take 20 mg by mouth daily.        . polyethylene glycol (MIRALAX / GLYCOLAX) packet Take 17 g by mouth daily as needed.         No current facility-administered medications for this visit.    Allergies as of 11/21/2012 - Review Complete 11/21/2012  Allergen Reaction Noted  . Flagyl (metronidazole hcl) Itching and Rash 09/22/2010    Past Medical  History  Diagnosis Date  . Parkinson's disease 2001  . Restless leg syndrome   . RP (retinitis pigmentosa)   . Anxiety   . Sleep apnea   . Hearing loss   . Hyperlipemia   . Hypertension   . Insomnia   . Degenerative joint disease   . Peripheral neuropathy   . B12 deficiency   . Depression   . Urinary incontinence   . TIA (transient ischemic attack)   . GERD (gastroesophageal reflux disease)     EGD 07/02/2003 Dr. Rourk  . IBS (irritable bowel syndrome)     Colonoscopy normal Dr. Rourk 06/2003    Past Surgical History  Procedure Laterality Date  . Cholecystectomy  2000  . Total abdominal hysterectomy    . Benign breast biopsy      left (remotely) and right (06/2012)  . Left oophorectomy      Family History  Problem Relation Age of Onset  . Lung cancer Brother   . Ovarian cancer Daughter 32  . Lymphoma Mother   . Coronary artery disease Father   . Colon cancer Neg Hx     History   Social History  . Marital Status: Divorced    Spouse Name: N/A    Number of Children: 3  . Years of Education: N/A   Occupational History  . disabled       Previously worked at [employer]   Social History Main Topics  . Smoking status: Never Smoker   . Smokeless tobacco: Never Used  . Alcohol Use: No  . Drug Use: No  . Sexually Active: No   Other Topics Concern  . Not on file   Social History Narrative  . No narrative on file      ROS:  General: Negative for anorexia, weight loss, fever, chills, fatigue, weakness. Eyes: chronic poor eye sight.  ENT: Negative for hoarseness, difficulty swallowing , nasal congestion. CV: Negative for chest pain, angina, palpitations, dyspnea on exertion, peripheral edema.  Respiratory: Negative for dyspnea at rest, dyspnea on exertion, cough, sputum, wheezing.  GI: See history of present illness. GU:  Negative for dysuria, hematuria, urinary incontinence, urinary frequency, nocturnal urination.  MS: Negative for joint pain, low back pain.   Derm: Negative for rash or itching.  Neuro: Negative for weakness, abnormal sensation, seizure, frequent headaches, memory loss, confusion.  Psych: Negative for anxiety, depression, suicidal ideation, hallucinations.  Endo: Negative for unusual weight change.  Heme: Negative for bruising or bleeding. Allergy: Negative for rash or hives.    Physical Examination:  BP 136/82  Pulse 70  Temp(Src) 98.2 F (36.8 C) (Oral)  Ht 5' 2" (1.575 m)  Wt 168 lb (76.204 kg)  BMI 30.72 kg/m2   General: Well-nourished, well-developed in no acute distress.  Head: Normocephalic, atraumatic.   Eyes: Conjunctiva pink, no icterus. Mouth: Oropharyngeal mucosa moist and pink , no lesions erythema or exudate. Neck: Supple without thyromegaly, masses, or lymphadenopathy.  Lungs: Clear to auscultation bilaterally.  Heart: Regular rate and rhythm, no murmurs rubs or gallops.  Abdomen: Bowel sounds are normal, vague mild lower abdominal tenderness, nondistended, no hepatosplenomegaly or masses, no abdominal bruits or    hernia , no rebound or guarding.   Rectal: defer Extremities: No lower extremity edema. No clubbing or deformities.  Neuro: Alert and oriented x 4 , grossly normal neurologically.  Skin: Warm and dry, no rash or jaundice.   Psych: Alert and cooperative, normal mood and affect.    

## 2012-11-29 NOTE — Progress Notes (Signed)
Quick Note:    Celiac negative  ______

## 2012-11-29 NOTE — Interval H&P Note (Signed)
History and Physical Interval Note:  11/29/2012 10:10 AM  Anna Jacobs  has presented today for surgery, with the diagnosis of ABDOMINAL BLOATING, IBS, LOWER ABD PAIN, BOWEL CHANGES  The various methods of treatment have been discussed with the patient and family. After consideration of risks, benefits and other options for treatment, the patient has consented to  Procedure(s) with comments: COLONOSCOPY (N/A) - 9:45/ WITH TERMINAL ILEOSCOPY BIOPSY (N/A) - RANDOM BIOPSY as a surgical intervention .  The patient's history has been reviewed, patient examined, no change in status, stable for surgery.  I have reviewed the patient's chart and labs.  Questions were answered to the patient's satisfaction.     [name]  Celiac screen negative. Ileocolonoscopy with segmental biopsies at a minimum now being performed.   The risks, benefits, limitations, alternatives and imponderables have been reviewed with the patient. Questions have been answered. All parties are agreeable.

## 2012-12-01 ENCOUNTER — Encounter (HOSPITAL_COMMUNITY): Payer: Self-pay | Admitting: Internal Medicine

## 2012-12-03 ENCOUNTER — Encounter: Payer: Self-pay | Admitting: Internal Medicine

## 2012-12-05 NOTE — Progress Notes (Signed)
Cc PCP 

## 2012-12-05 NOTE — Progress Notes (Unsigned)
Letter from: Corbin Ade  Send letter to patient.  Send copy of letter with path to referring provider and PCP.  Offer f/u appt w extender extender re:  Diarrhea wn next month

## 2012-12-06 ENCOUNTER — Encounter: Payer: Self-pay | Admitting: Internal Medicine

## 2012-12-06 NOTE — Progress Notes (Signed)
Pt is aware of OV on 01/01/13 at 10 with LSL and appt card was mailed

## 2013-01-01 ENCOUNTER — Encounter: Payer: Self-pay | Admitting: Gastroenterology

## 2013-01-01 ENCOUNTER — Ambulatory Visit (INDEPENDENT_AMBULATORY_CARE_PROVIDER_SITE_OTHER): Payer: PRIVATE HEALTH INSURANCE | Admitting: Gastroenterology

## 2013-01-01 VITALS — BP 113/77 | HR 70 | Temp 98.2°F | Ht 62.0 in | Wt 170.4 lb

## 2013-01-01 DIAGNOSIS — K589 Irritable bowel syndrome without diarrhea: Secondary | ICD-10-CM

## 2013-01-01 NOTE — Progress Notes (Signed)
Primary Care Physician: Kirstie Peri, MD  Primary Gastroenterologist:  Roetta Sessions, MD   Chief Complaint  Patient presents with  . Follow-up    HPI: Anna Jacobs is a 72 y.o. female here for followup of recent colonoscopy. She has a history of irritable bowel syndrome. Seen in the office on 11/21/2012 for postprandial fecal urgency with periods of incontinence. Complained of lower abdominal bloating. Complained of hemorrhoids. Did not appreciate treatment of her bowel function with Benefiber or Activia. She had negative celiac serologies. Recent ileocolonoscopy with segmental biopsies was unremarkable except for rectal tubular adenomas.  Normal BMS lately. Feels good right now. No issues with hemorrhoids lately. No abdominal pain, cramps. No heartburn. No dysphagia. Appetite good. Lots of questions about diet and IBS.  Current Outpatient Prescriptions  Medication Sig Dispense Refill  . ALPRAZolam (XANAX) 0.5 MG tablet Take 1 mg by mouth 2 (two) times daily as needed.        Marland Kitchen atorvastatin (LIPITOR) 40 MG tablet Take 40 mg by mouth daily.        . carbidopa-levodopa (SINEMET) 25-100 MG per tablet Take 1 tablet by mouth 2 (two) times daily as needed.        Marland Kitchen escitalopram (LEXAPRO) 20 MG tablet Take 20 mg by mouth daily.        Marland Kitchen omeprazole (PRILOSEC) 20 MG capsule Take 20 mg by mouth daily.         No current facility-administered medications for this visit.    Allergies as of 01/01/2013 - Review Complete 01/01/2013  Allergen Reaction Noted  . Flagyl (metronidazole hcl) Itching and Rash 09/22/2010    ROS:  General: Negative for anorexia, weight loss, fever, chills, fatigue, weakness. ENT: Negative for hoarseness, difficulty swallowing , nasal congestion. CV: Negative for chest pain, angina, palpitations, dyspnea on exertion, peripheral edema.  Respiratory: Negative for dyspnea at rest, dyspnea on exertion, cough, sputum, wheezing.  GI: See history of present illness. GU:   Negative for dysuria, hematuria, urinary incontinence, urinary frequency, nocturnal urination.  Endo: Negative for unusual weight change.    Physical Examination:   BP 113/77  Pulse 70  Temp(Src) 98.2 F (36.8 C) (Oral)  Ht 5\' 2"  (1.575 m)  Wt 170 lb 7.2 oz (77.316 kg)  BMI 31.17 kg/m2  General: Well-nourished, well-developed in no acute distress.  Eyes: No icterus. Mouth: Oropharyngeal mucosa moist and pink , no lesions erythema or exudate. Lungs: Clear to auscultation bilaterally.  Heart: Regular rate and rhythm, no murmurs rubs or gallops.  Abdomen: Bowel sounds are normal, nontender, nondistended, no hepatosplenomegaly or masses, no abdominal bruits or hernia , no rebound or guarding.   Extremities: No lower extremity edema. No clubbing or deformities. Neuro: Alert and oriented x 4   Skin: Warm and dry, no jaundice.   Psych: Alert and cooperative, normal mood and affect.

## 2013-01-01 NOTE — Patient Instructions (Addendum)
Take Miralax 1/2 to one capful on days you do not have a bowel movement.  Eat Activia or another Dannon yogurt product once daily for 3-4 weeks and cycle every few months.  Office visit as needed.    Diet and Irritable Bowel Syndrome  No cure has been found for irritable bowel syndrome (IBS). Many options are available to treat the symptoms. Your caregiver will give you the best treatments available for your symptoms. He or she will also encourage you to manage stress and to make changes to your diet. You need to work with your caregiver and Registered Dietician to find the best combination of medicine, diet, counseling, and support to control your symptoms. The following are some diet suggestions. FOODS THAT MAKE IBS WORSE  Fatty foods, such as [place] fries.  Milk products, such as cheese or ice cream.  Chocolate.  Alcohol.  Caffeine (found in coffee and some sodas).  Carbonated drinks, such as soda. If certain foods cause symptoms, you should eat less of them or stop eating them. FOOD JOURNAL   Keep a journal of the foods that seem to cause distress. Write down:  What you are eating during the day and when.  What problems you are having after eating.  When the symptoms occur in relation to your meals.  What foods always make you feel badly.  Take your notes with you to your caregiver to see if you should stop eating certain foods. FOODS THAT MAKE IBS BETTER Fiber reduces IBS symptoms, especially constipation, because it makes stools soft, bulky, and easier to pass. Fiber is found in bran, bread, cereal, beans, fruit, and vegetables. Examples of foods with fiber include:  Apples.  Peaches.  Pears.  Berries.  Figs.  Broccoli, raw.  Cabbage.  Carrots.  Raw peas.  Kidney beans.  Lima beans.  Whole-grain bread.  Whole-grain cereal. Add foods with fiber to your diet a little at a time. This will let your body get used to them. Too much fiber at once might  cause gas and swelling of your abdomen. This can trigger symptoms in a person with IBS. Caregivers usually recommend a diet with enough fiber to produce soft, painless bowel movements. High fiber diets may cause gas and bloating. However, these symptoms often go away within a few weeks, as your body adjusts. In many cases, dietary fiber may lessen IBS symptoms, particularly constipation. However, it may not help pain or diarrhea. High fiber diets keep the colon mildly enlarged (distended) with the added fiber. This may help prevent spasms in the colon. Some forms of fiber also keep water in the stool, thereby preventing hard stools that are difficult to pass.  Besides telling you to eat more foods with fiber, your caregiver may also tell you to get more fiber by taking a fiber pill or drinking water mixed with a special high fiber powder. An example of this is a natural fiber laxative containing psyllium seed.  TIPS  Large meals can cause cramping and diarrhea in people with IBS. If this happens to you, try eating 4 or 5 small meals a day, or try eating less at each of your usual 3 meals. It may also help if your meals are low in fat and high in carbohydrates. Examples of carbohydrates are pasta, rice, whole-grain breads and cereals, fruits, and vegetables.  If dairy products cause your symptoms to flare up, you can try eating less of those foods. You might be able to handle yogurt better than  other dairy products, because it contains bacteria that helps with digestion. Dairy products are an important source of calcium and other nutrients. If you need to avoid dairy products, be sure to talk with a Registered Dietitian about getting these nutrients through other food sources.  Drink enough water and fluids to keep your urine clear or pale yellow. This is important, especially if you have diarrhea. FOR MORE INFORMATION  International Foundation for Functional Gastrointestinal Disorders: www.iffgd.org    National Digestive Diseases Information Clearinghouse: digestive.StageSync.si Document Released: 08/28/2003 Document Revised: 08/30/2011 Document Reviewed: 05/15/2007 [facility] Patient Information 2014 [place], [place].

## 2013-01-01 NOTE — Assessment & Plan Note (Signed)
Doing very well at this time. Actually some solid stools and days without BMs since her colonoscopy. Spend quite a bit of time discussing IBS with patient. Discussed dietary factors. Went over all of her results which appeared to provide her with some piece of mind.  She will take Miralax 1/2 to one capful on days she does not have a bowel movement.  She will take Activia or another Dannon yogurt product once daily for 3-4 weeks and cycle every few months.  IBS diet literature discussed and provided to patient.  OV prn.

## 2013-01-01 NOTE — Progress Notes (Signed)
Cc PCP 

## 2013-06-22 NOTE — Progress Notes (Signed)
REVIEWED.  

## 2017-10-04 ENCOUNTER — Other Ambulatory Visit (HOSPITAL_BASED_OUTPATIENT_CLINIC_OR_DEPARTMENT_OTHER): Payer: Self-pay

## 2017-10-04 DIAGNOSIS — G4733 Obstructive sleep apnea (adult) (pediatric): Secondary | ICD-10-CM

## 2017-10-16 ENCOUNTER — Ambulatory Visit: Payer: Medicare Other | Attending: Neurology | Admitting: Neurology

## 2017-10-16 DIAGNOSIS — G4733 Obstructive sleep apnea (adult) (pediatric): Secondary | ICD-10-CM | POA: Diagnosis not present

## 2017-10-23 NOTE — Procedures (Signed)
  Morton A. Merlene Laughter, MD     [email or web address]             NOCTURNAL POLYSOMNOGRAPHY   LOCATION: [facility]  Patient Name: Anna Jacobs, Anna Jacobs Date: 10/16/2017 Gender: Female D.O.B: [birth year] Age (years): 58 Referring Provider: Phillips Odor MD, ABSM Height (inches): 62 Interpreting Physician: Phillips Odor MD, ABSM Weight (lbs): 170 RPSGT: Peak, Robert BMI: 31 MRN: [id number] Neck Size: 15.00 CLINICAL INFORMATION Sleep Study Type: NPSG     Indication for sleep study: N/A     Epworth Sleepiness Score: 5     SLEEP STUDY TECHNIQUE As per the AASM Manual for the Scoring of Sleep and Associated Events v2.3 (April 2016) with a hypopnea requiring 4% desaturations.  The channels recorded and monitored were frontal, central and occipital EEG, electrooculogram (EOG), submentalis EMG (chin), nasal and oral airflow, thoracic and abdominal wall motion, anterior tibialis EMG, snore microphone, electrocardiogram, and pulse oximetry.  MEDICATIONS Medications self-administered by patient taken the night of the study : N/A  Current Outpatient Medications:  .  ALPRAZolam (XANAX) 0.5 MG tablet, Take 1 mg by mouth 2 (two) times daily as needed.  , Disp: , Rfl:  .  atorvastatin (LIPITOR) 40 MG tablet, Take 40 mg by mouth daily.  , Disp: , Rfl:  .  carbidopa-levodopa (SINEMET) 25-100 MG per tablet, Take 1 tablet by mouth 2 (two) times daily as needed.  , Disp: , Rfl:  .  escitalopram (LEXAPRO) 20 MG tablet, Take 20 mg by mouth daily.  , Disp: , Rfl:  .  omeprazole (PRILOSEC) 20 MG capsule, Take 20 mg by mouth daily.  , Disp: , Rfl:      SLEEP ARCHITECTURE The study was initiated at 9:44:09 PM and ended at 5:00:50 AM.  Sleep onset time was 55.9 minutes and the sleep efficiency was 61.2%%. The total sleep time was 267.3 minutes.  Stage REM latency was 254.0 minutes.  The patient spent 10.8%% of the night in stage N1 sleep, 67.5%% in stage N2 sleep, 6.0%% in  stage N3 and 15.71% in REM.  Alpha intrusion was absent.  Supine sleep was 31.61%.  RESPIRATORY PARAMETERS The overall apnea/hypopnea index (AHI) was 9.0 per hour. There were 15 total apneas, including 10 obstructive, 3 central and 2 mixed apneas. There were 25 hypopneas and 122 RERAs.  The AHI during Stage REM sleep was 8.6 per hour.  AHI while supine was 13.5 per hour.  The mean oxygen saturation was 93.5%. The minimum SpO2 during sleep was 87.0%.  moderate snoring was noted during this study.  CARDIAC DATA The 2 lead EKG demonstrated sinus rhythm. The mean heart rate was 66.3 beats per minute. Other EKG findings include: None. LEG MOVEMENT DATA The total PLMS were 0 with a resulting PLMS index of 0.0. Associated arousal with leg movement index was 0.0.  IMPRESSIONS Mild obstructive sleep apnea is documented during this recording.  However, the severity does not require positive pressure treatment .   Delano Metz, MD Diplomate, American Board of Sleep Medicine.  ELECTRONICALLY SIGNED ON:  10/23/2017, 5:10 PM [place] PH: [phone]   FX: [phone] [place]

## 2018-06-02 ENCOUNTER — Ambulatory Visit (INDEPENDENT_AMBULATORY_CARE_PROVIDER_SITE_OTHER): Payer: Medicare Other | Admitting: Urology

## 2018-06-02 DIAGNOSIS — N3941 Urge incontinence: Secondary | ICD-10-CM

## 2018-07-12 ENCOUNTER — Ambulatory Visit (INDEPENDENT_AMBULATORY_CARE_PROVIDER_SITE_OTHER): Payer: Medicare Other | Admitting: Urology

## 2018-07-12 DIAGNOSIS — N3941 Urge incontinence: Secondary | ICD-10-CM | POA: Diagnosis not present

## 2019-01-03 ENCOUNTER — Ambulatory Visit (INDEPENDENT_AMBULATORY_CARE_PROVIDER_SITE_OTHER): Payer: Medicare Other | Admitting: Urology

## 2019-01-03 DIAGNOSIS — N3941 Urge incontinence: Secondary | ICD-10-CM | POA: Diagnosis not present

## 2019-01-10 ENCOUNTER — Encounter: Payer: Self-pay | Admitting: Gastroenterology

## 2019-02-13 ENCOUNTER — Encounter: Payer: Self-pay | Admitting: Gastroenterology

## 2019-02-13 ENCOUNTER — Ambulatory Visit (INDEPENDENT_AMBULATORY_CARE_PROVIDER_SITE_OTHER): Payer: Medicare Other | Admitting: Gastroenterology

## 2019-02-13 ENCOUNTER — Encounter (INDEPENDENT_AMBULATORY_CARE_PROVIDER_SITE_OTHER): Payer: Self-pay

## 2019-02-13 VITALS — BP 120/68 | HR 64 | Temp 98.5°F | Ht 62.0 in | Wt 163.0 lb

## 2019-02-13 DIAGNOSIS — K219 Gastro-esophageal reflux disease without esophagitis: Secondary | ICD-10-CM | POA: Diagnosis not present

## 2019-02-13 DIAGNOSIS — R1310 Dysphagia, unspecified: Secondary | ICD-10-CM

## 2019-02-13 MED ORDER — FAMOTIDINE 20 MG PO TABS: 20.0000 mg | ORAL_TABLET | Freq: Every day | ORAL | 3 refills | Status: DC

## 2019-02-13 NOTE — Addendum Note (Signed)
Addended by: Wyline Beady on: 02/13/2019 11:04 AM   Modules accepted: Orders

## 2019-02-13 NOTE — Progress Notes (Signed)
HPI: This is a very pleasant 78 year old woman who was referred to me by Monico Blitz, MD  to evaluate dysphasia, GERD.    Chief complaint is GERD, dysphasia  Looks like she has been a long-term patient of [facility], Dr. Gala Romney.  Last office note that I can see was about 6 years ago there.  I am meeting her for the first time today.  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  She has had troubles with mucus in her throat and esophagus for many years.  Over the past year she has had gradually increasing difficulty swallowing pills and food.  She has never had to regurgitate food or pills but has to clear her throat several times to get it to go down.  She has no troubles with liquids.  She does have some sour belches and burning in her esophagus at times.  She was taking Prilosec for this however those pills are giving her troubles and so she has been off of them for weeks it sounds like.  Her weight is overall stable.  She has had no overt GI bleeding.     Review of systems: Pertinent positive and negative review of systems were noted in the above HPI section. All other review negative.   Past Medical History:  Diagnosis Date  . Anxiety   . B12 deficiency   . Degenerative joint disease   . Depression   . GERD (gastroesophageal reflux disease)    EGD 07/02/2003 Dr. Gala Romney  . Hearing loss   . Hyperlipemia   . Hypertension   . IBS (irritable bowel syndrome)    Colonoscopy normal Dr. Gala Romney 06/2003  . Insomnia   . Parkinson's disease 2001  . Peripheral neuropathy   . Restless leg syndrome   . RP (retinitis pigmentosa)   . Sleep apnea   . TIA (transient ischemic attack)   . Urinary incontinence     Past Surgical History:  Procedure Laterality Date  . Benign breast biopsy     left (remotely) and right (06/2012)  . BIOPSY N/A 11/29/2012   Procedure: BIOPSY;  Surgeon: Daneil Dolin, MD;  Location: [facility];  Service: Endoscopy;   Laterality: N/A;  RANDOM BIOPSY  . CHOLECYSTECTOMY  2000  . COLONOSCOPY N/A 11/29/2012   [id number] rectal polyps-removed Otherwise normal examination distal terminal ileum appeared normal. Random colon biopsies were negative for microscopic colitis. Rectum polyp, tubular adenoma. Next TCS 11/2019.  Marland Kitchen LEFT OOPHORECTOMY    . TOTAL ABDOMINAL HYSTERECTOMY      Current Outpatient Medications  Medication Sig Dispense Refill  . ALPRAZolam (XANAX) 0.5 MG tablet Take 1 mg by mouth 2 (two) times daily as needed.      Marland Kitchen atorvastatin (LIPITOR) 40 MG tablet Take 40 mg by mouth daily.      . carbidopa-levodopa (SINEMET) 25-100 MG per tablet Take 1 tablet by mouth 2 (two) times daily as needed.      . diclofenac sodium (VOLTAREN) 1 % GEL Apply topically 2 (two) times daily as needed.    Marland Kitchen escitalopram (LEXAPRO) 20 MG tablet Take 20 mg by mouth daily.      Marland Kitchen MYRBETRIQ 50 MG TB24 tablet Take 50 mg by mouth daily.    . traMADol (ULTRAM) 50 MG tablet Take 1 tablet by mouth as needed.    Marland Kitchen omeprazole (PRILOSEC) 20 MG capsule Take 20 mg by mouth daily.       No current facility-administered  medications for this visit.     Allergies as of 02/13/2019 - Review Complete 02/13/2019  Allergen Reaction Noted  . Flagyl [metronidazole hcl] Itching and Rash 09/22/2010    Family History  Problem Relation Age of Onset  . Coronary artery disease Father   . Lymphoma Mother   . Lung cancer Brother   . Ovarian cancer Daughter 80  . Colon cancer Neg Hx     Social History   Socioeconomic History  . Marital status: Divorced    Spouse name: Not on file  . Number of children: 3  . Years of education: Not on file  . Highest education level: Not on file  Occupational History  . Occupation: disabled    Comment: Previously worked at [employer]  . Financial resource strain: Not on file  . Food insecurity    Worry: Not on file    Inability: Not on file  . Transportation needs    Medical: Not on  file    Non-medical: Not on file  Tobacco Use  . Smoking status: Never Smoker  . Smokeless tobacco: Never Used  Substance and Sexual Activity  . Alcohol use: No  . Drug use: No  . Sexual activity: Not Currently  Lifestyle  . Physical activity    Days per week: Not on file    Minutes per session: Not on file  . Stress: Not on file  Relationships  . Social Herbalist on phone: Not on file    Gets together: Not on file    Attends religious service: Not on file    Active member of club or organization: Not on file    Attends meetings of clubs or organizations: Not on file    Relationship status: Not on file  . Intimate partner violence    Fear of current or ex partner: Not on file    Emotionally abused: Not on file    Physically abused: Not on file    Forced sexual activity: Not on file  Other Topics Concern  . Not on file  Social History Narrative  . Not on file     Physical Exam: BP 120/68   Pulse 64   Temp 98.5 F (36.9 C) (Oral)   Ht 5\' 2"  (1.575 m)   Wt 163 lb (73.9 kg)   BMI 29.81 kg/m  Constitutional: generally well-appearing, blind, has a cane Psychiatric: alert and oriented x3 Eyes: extraocular movements intact Mouth: oral pharynx moist, no lesions Neck: supple no lymphadenopathy Cardiovascular: heart regular rate and rhythm Lungs: clear to auscultation bilaterally Abdomen: soft, nontender, nondistended, no obvious ascites, no peritoneal signs, normal bowel sounds Extremities: no lower extremity edema bilaterally Skin: no lesions on visible extremities   Assessment and plan: 78 y.o. female with GERD, dysphasia  I do think she has at least a component of acid regurg that is causing some of her troubles.  She is not able to swallow omeprazole, Prilosec and so for now I am going to put her on Pepcid, famotidine 20 mg twice daily.  I recommended an upper endoscopy at her soonest convenience.  This might be peptic stricturing.  I doubt she has a  neoplasm since her weight is been going up and her symptoms have been going on for at least a year or maybe even more than that.    Please see the "Patient Instructions" section for addition details about the plan.   Owens Loffler, MD [facility] 02/13/2019, 10:32  AM  Cc: Monico Blitz, MD

## 2019-02-13 NOTE — Patient Instructions (Addendum)
Use Famotidine 20 mg daily.   You have been scheduled for an endoscopy. Please follow written instructions given to you at your visit today. If you use inhalers (even only as needed), please bring them with you on the day of your procedure.

## 2019-02-19 ENCOUNTER — Encounter: Payer: Medicare Other | Admitting: Gastroenterology

## 2019-02-22 ENCOUNTER — Telehealth: Payer: Self-pay

## 2019-02-22 NOTE — Telephone Encounter (Signed)
Returning call. Answered "NO" to all COVID 19 screening questions.  Reminder her to check in on the 4th floor. That care partner may wait in the car or upstairs in lobby and to be sure that both are wearing a mask when they enter the building.

## 2019-02-22 NOTE — Telephone Encounter (Signed)
Covid-19 screening questions   Do you now or have you had a fever in the last 14 days?  Do you have any respiratory symptoms of shortness of breath or cough now or in the last 14 days?  Do you have any family members or close contacts with diagnosed or suspected Covid-19 in the past 14 days?  Have you been tested for Covid-19 and found to be positive?       

## 2019-02-23 ENCOUNTER — Telehealth: Payer: Self-pay | Admitting: *Deleted

## 2019-02-23 ENCOUNTER — Ambulatory Visit: Payer: Medicare Other | Admitting: Gastroenterology

## 2019-02-23 ENCOUNTER — Other Ambulatory Visit: Payer: Self-pay

## 2019-02-23 VITALS — BP 125/9 | HR 86 | Temp 98.6°F | Ht 62.0 in | Wt 163.0 lb

## 2019-02-23 DIAGNOSIS — K219 Gastro-esophageal reflux disease without esophagitis: Secondary | ICD-10-CM

## 2019-02-23 MED ORDER — BOOST PO LIQD: 237.0000 mL | Freq: Two times a day (BID) | ORAL | 2 refills | Status: AC

## 2019-02-23 MED ORDER — SODIUM CHLORIDE 0.9 % IV SOLN: 500.0000 mL | Freq: Once | INTRAVENOUS | Status: AC

## 2019-02-23 NOTE — Progress Notes (Signed)
Pt. States she had a bowl of frosted flakes t 10AM today.   Dr. Ardis Hughs advised.

## 2019-02-23 NOTE — Progress Notes (Signed)
Pt requests rx for Boost- her [employer] will cover that.  Per Dr. Ardis Hughs, pt is to have be prescribed Boost 1 can BID, #60, 2 refills

## 2019-02-23 NOTE — Telephone Encounter (Signed)
Pt is blind.  Her daughter is her POA.  I called her and LMOM to call back in order to get verbal consent for EGD on 02-28-19

## 2019-02-23 NOTE — Progress Notes (Signed)
Pt. Procedure cancelled per Dr. Ardis Hughs.  Rescheduled for September 9th.  Instructions for next week given to pt. And caregiver by Randall Hiss RN.

## 2019-02-27 ENCOUNTER — Telehealth: Payer: Self-pay | Admitting: Gastroenterology

## 2019-02-27 NOTE — Telephone Encounter (Signed)
Amy Zenia Resides returned call and gave a verbal consent for patient to have her EGD on 03/06/2019

## 2019-02-28 ENCOUNTER — Encounter: Payer: Medicare Other | Admitting: Gastroenterology

## 2019-03-01 NOTE — Telephone Encounter (Signed)
Left detailed message on machine explaining we needed her to call us back- we need two witnesses to hear her give [place] consent over the phone.

## 2019-03-01 NOTE — Telephone Encounter (Signed)
Pt's daughter  Marlaine Hind calling us back best call back # [phone].

## 2019-03-02 ENCOUNTER — Telehealth: Payer: Self-pay

## 2019-03-02 NOTE — Telephone Encounter (Signed)
Called and left message x 2 for daughter trying to get telephone consent for this procedure.

## 2019-03-05 ENCOUNTER — Telehealth: Payer: Self-pay | Admitting: Gastroenterology

## 2019-03-05 NOTE — Telephone Encounter (Signed)
Do you now or have you had a fever in the last 14 days?         Do you have any respiratory symptoms of shortness of breath or cough now or in the last 14 days?        Do you have any family members or close contacts with diagnosed or suspected Covid-19 in the past 14 days?         Have you been tested for Covid-19 and found to be positive?        Pt made aware to check in on theth floor and that care partner may wait in the car or come up to the lobby during the procedure but will need to provide their own mask.

## 2019-03-06 ENCOUNTER — Telehealth: Payer: Self-pay | Admitting: Gastroenterology

## 2019-03-06 ENCOUNTER — Encounter: Payer: Self-pay | Admitting: Gastroenterology

## 2019-03-06 ENCOUNTER — Ambulatory Visit (AMBULATORY_SURGERY_CENTER): Payer: Medicare Other | Admitting: Gastroenterology

## 2019-03-06 ENCOUNTER — Other Ambulatory Visit: Payer: Self-pay

## 2019-03-06 VITALS — BP 147/87 | HR 70 | Temp 97.2°F | Resp 14 | Ht 62.0 in | Wt 163.0 lb

## 2019-03-06 DIAGNOSIS — K3189 Other diseases of stomach and duodenum: Secondary | ICD-10-CM

## 2019-03-06 DIAGNOSIS — R1310 Dysphagia, unspecified: Secondary | ICD-10-CM

## 2019-03-06 DIAGNOSIS — K2970 Gastritis, unspecified, without bleeding: Secondary | ICD-10-CM | POA: Diagnosis not present

## 2019-03-06 DIAGNOSIS — K219 Gastro-esophageal reflux disease without esophagitis: Secondary | ICD-10-CM

## 2019-03-06 MED ORDER — SODIUM CHLORIDE 0.9 % IV SOLN: 500.0000 mL | Freq: Once | INTRAVENOUS | Status: DC

## 2019-03-06 NOTE — Progress Notes (Signed)
Report given to PACU, vss 

## 2019-03-06 NOTE — Patient Instructions (Signed)
Handout given for gastritis.  Boost can not be prescribed because insurance will not pay for it.  Pick up some and take twice a day for nutrition.  Await pathology results.  YOU HAD AN ENDOSCOPIC PROCEDURE TODAY AT [facility]:   Refer to the procedure report that was given to you for any specific questions about what was found during the examination.  If the procedure report does not answer your questions, please call your gastroenterologist to clarify.  If you requested that your care partner not be given the details of your procedure findings, then the procedure report has been included in a sealed envelope for you to review at your convenience later.  YOU SHOULD EXPECT: Some feelings of bloating in the abdomen. Passage of more gas than usual.  Walking can help get rid of the air that was put into your GI tract during the procedure and reduce the bloating. If you had a lower endoscopy (such as a colonoscopy or flexible sigmoidoscopy) you may notice spotting of blood in your stool or on the toilet paper. If you underwent a bowel prep for your procedure, you may not have a normal bowel movement for a few days.  Please Note:  You might notice some irritation and congestion in your nose or some drainage.  This is from the oxygen used during your procedure.  There is no need for concern and it should clear up in a day or so.  SYMPTOMS TO REPORT IMMEDIATELY:   Following upper endoscopy (EGD)  Vomiting of blood or coffee ground material  New chest pain or pain under the shoulder blades  Painful or persistently difficult swallowing  New shortness of breath  Fever of 100F or higher  Black, tarry-looking stools  For urgent or emergent issues, a gastroenterologist can be reached at any hour by calling [phone].   DIET:  We do recommend a small meal at first, but then you may proceed to your regular diet.  Drink plenty of fluids but you should avoid alcoholic beverages for 24  hours.  ACTIVITY:  You should plan to take it easy for the rest of today and you should NOT DRIVE or use heavy machinery until tomorrow (because of the sedation medicines used during the test).    FOLLOW UP: Our staff will call the number listed on your records 48-72 hours following your procedure to check on you and address any questions or concerns that you may have regarding the information given to you following your procedure. If we do not reach you, we will leave a message.  We will attempt to reach you two times.  During this call, we will ask if you have developed any symptoms of COVID 19. If you develop any symptoms (ie: fever, flu-like symptoms, shortness of breath, cough etc.) before then, please call [phone].  If you test positive for Covid 19 in the 2 weeks post procedure, please call and report this information to [place].    If any biopsies were taken you will be contacted by phone or by letter within the next 1-3 weeks.  Please call us at [phone] if you have not heard about the biopsies in 3 weeks.    SIGNATURES/CONFIDENTIALITY: You and/or your care partner have signed paperwork which will be entered into your electronic medical record.  These signatures attest to the fact that that the information above on your After Visit Summary has been reviewed and is understood.  Full responsibility of the confidentiality  of this discharge information lies with you and/or your care-partner.

## 2019-03-06 NOTE — Progress Notes (Signed)
Temperature taken by A.M, VS taken by C.W.

## 2019-03-06 NOTE — Op Note (Signed)
[facility] Patient Name: Anna Jacobs Procedure Date: 03/06/2019 9:44 AM MRN: [id number] Endoscopist: Milus Banister , MD Age: 78 Referring MD:  Date of Birth: [birth year] Gender: Female Account #: [id number] Procedure:                Upper GI endoscopy Indications:              Dysphagia Medicines:                Monitored Anesthesia Care Procedure:                Pre-Anesthesia Assessment:                           - Prior to the procedure, a History and Physical                            was performed, and patient medications and                            allergies were reviewed. The patient's tolerance of                            previous anesthesia was also reviewed. The risks                            and benefits of the procedure and the sedation                            options and risks were discussed with the patient.                            All questions were answered, and informed consent                            was obtained. Prior Anticoagulants: The patient has                            taken no previous anticoagulant or antiplatelet                            agents. ASA Grade Assessment: II - A patient with                            mild systemic disease. After reviewing the risks                            and benefits, the patient was deemed in                            satisfactory condition to undergo the procedure.                           After obtaining informed consent, the endoscope was  passed under direct vision. Throughout the                            procedure, the patient's blood pressure, pulse, and                            oxygen saturations were monitored continuously. The                            Endoscope was introduced through the mouth, and                            advanced to the second part of duodenum. The upper                            GI endoscopy was accomplished without  difficulty.                            The patient tolerated the procedure well. Scope In: Scope Out: Findings:                 Mild inflammation characterized by erythema and                            friability was found in the gastric antrum.                            Biopsies were taken with a cold forceps for                            histology.                           The exam was otherwise without abnormality. Complications:            No immediate complications. Estimated blood loss:                            None. Estimated Blood Loss:     Estimated blood loss: none. Impression:               - Mild, non-specific gastritis, biopsied to check                            for H. pylori.                           - The examination was otherwise normal. Recommendation:           - Patient has a contact number available for                            emergencies. The signs and symptoms of potential                            delayed complications were discussed with the  patient. Return to normal activities tomorrow.                            Written discharge instructions were provided to the                            patient.                           - Resume previous diet. Will call in a prescription                            for Boost (one can twice daily, disp 1 month with                            11 refills) per her request.                           - Continue present medications.                           - Await pathology results. Milus Banister, MD 03/06/2019 9:56:34 AM This report has been signed electronically.

## 2019-03-06 NOTE — Progress Notes (Signed)
Called to room to assist during endoscopic procedure.  Patient ID and intended procedure confirmed with present staff. Received instructions for my participation in the procedure from the performing physician.  

## 2019-03-08 ENCOUNTER — Telehealth: Payer: Self-pay

## 2019-03-08 NOTE — Telephone Encounter (Signed)
Left message on follow up call. 

## 2019-03-08 NOTE — Telephone Encounter (Signed)
  Follow up Call-  Call back number 03/06/2019 02/23/2019  Post procedure Call Back phone  # [phone]  Permission to leave phone message Yes Yes  Some recent data might be hidden     Patient questions:  Do you have a fever, pain , or abdominal swelling? No. Pain Score  0 *  Have you tolerated food without any problems? Yes.    Have you been able to return to your normal activities? Yes.    Do you have any questions about your discharge instructions: Diet   No. Medications  No. Follow up visit  No.  Do you have questions or concerns about your Care? No.  Actions: * If pain score is 4 or above: No action needed, pain <4.  1. Have you developed a fever since your procedure? No  2.   Have you had an respiratory symptoms (SOB or cough) since your procedure? no  3.   Have you tested positive for COVID 19 since your procedure no  4.   Have you had any family members/close contacts diagnosed with the COVID 19 since your procedure?  no   If yes to any of these questions please route to Joylene John, RN and Alphonsa Gin, Therapist, sports.

## 2019-03-09 ENCOUNTER — Encounter: Payer: Self-pay | Admitting: Gastroenterology

## 2019-03-13 NOTE — Telephone Encounter (Signed)
The pt has been advised that a letter was mailed and was also provided with the information as stated below.   March 09, 2019   Anna Jacobs [address] [place] [id number]   Dear Ms. Astorino,  The biopsies taken during your recent upper endoscopy showed no sign of infection or cancer.   You should continue to follow the recommendations that we discussed at the time of your procedure.   If you have any questions or concerns, please don't hesitate to call.  Sincerely,    Milus Banister, MD

## 2019-06-25 DIAGNOSIS — E7800 Pure hypercholesterolemia, unspecified: Secondary | ICD-10-CM | POA: Diagnosis not present

## 2019-06-25 DIAGNOSIS — M159 Polyosteoarthritis, unspecified: Secondary | ICD-10-CM | POA: Diagnosis not present

## 2019-06-25 DIAGNOSIS — I639 Cerebral infarction, unspecified: Secondary | ICD-10-CM | POA: Diagnosis not present

## 2019-06-27 DIAGNOSIS — H548 Legal blindness, as defined in USA: Secondary | ICD-10-CM | POA: Diagnosis not present

## 2019-06-29 ENCOUNTER — Other Ambulatory Visit: Payer: Self-pay | Admitting: Urology

## 2019-07-03 ENCOUNTER — Other Ambulatory Visit: Payer: Self-pay

## 2019-07-03 ENCOUNTER — Ambulatory Visit: Payer: Medicare Other | Attending: Internal Medicine

## 2019-07-03 DIAGNOSIS — Z20822 Contact with and (suspected) exposure to covid-19: Secondary | ICD-10-CM

## 2019-07-04 LAB — NOVEL CORONAVIRUS, NAA: SARS-CoV-2, NAA: DETECTED — AB

## 2019-07-05 ENCOUNTER — Telehealth: Payer: Self-pay | Admitting: Nurse Practitioner

## 2019-07-05 NOTE — Telephone Encounter (Signed)
Called to Discuss with patient about Covid symptoms and the use of bamlanivimab, a monoclonal antibody infusion for those with mild to moderate Covid symptoms and at a high risk of hospitalization.     Patient states that symptoms started 10 days ago and therefore would not meet criteria for infusion.

## 2019-07-05 NOTE — Telephone Encounter (Signed)
Please disregard previous note.  Called to Discuss with patient about Covid symptoms and the use of bamlanivimab, a monoclonal antibody infusion for those with mild to moderate Covid symptoms and at a high risk of hospitalization.     Pt is qualified for this infusion at the [facility] infusion center due to co-morbid conditions and/or a member of an at-risk group.     Unable to reach pt

## 2019-07-09 DIAGNOSIS — H548 Legal blindness, as defined in USA: Secondary | ICD-10-CM | POA: Diagnosis not present

## 2019-07-09 DIAGNOSIS — M1990 Unspecified osteoarthritis, unspecified site: Secondary | ICD-10-CM | POA: Diagnosis not present

## 2019-07-09 DIAGNOSIS — G20 Parkinson's disease: Secondary | ICD-10-CM | POA: Diagnosis not present

## 2019-07-09 DIAGNOSIS — Z299 Encounter for prophylactic measures, unspecified: Secondary | ICD-10-CM | POA: Diagnosis not present

## 2019-07-28 DIAGNOSIS — H548 Legal blindness, as defined in USA: Secondary | ICD-10-CM | POA: Diagnosis not present

## 2019-07-30 DIAGNOSIS — E7800 Pure hypercholesterolemia, unspecified: Secondary | ICD-10-CM | POA: Diagnosis not present

## 2019-07-30 DIAGNOSIS — I639 Cerebral infarction, unspecified: Secondary | ICD-10-CM | POA: Diagnosis not present

## 2019-07-30 DIAGNOSIS — M159 Polyosteoarthritis, unspecified: Secondary | ICD-10-CM | POA: Diagnosis not present

## 2019-08-13 DIAGNOSIS — M1990 Unspecified osteoarthritis, unspecified site: Secondary | ICD-10-CM | POA: Diagnosis not present

## 2019-08-13 DIAGNOSIS — I10 Essential (primary) hypertension: Secondary | ICD-10-CM | POA: Diagnosis not present

## 2019-08-13 DIAGNOSIS — M1711 Unilateral primary osteoarthritis, right knee: Secondary | ICD-10-CM | POA: Diagnosis not present

## 2019-08-13 DIAGNOSIS — Z87891 Personal history of nicotine dependence: Secondary | ICD-10-CM | POA: Diagnosis not present

## 2019-08-13 DIAGNOSIS — G20 Parkinson's disease: Secondary | ICD-10-CM | POA: Diagnosis not present

## 2019-08-13 DIAGNOSIS — Z299 Encounter for prophylactic measures, unspecified: Secondary | ICD-10-CM | POA: Diagnosis not present

## 2019-08-25 DIAGNOSIS — H548 Legal blindness, as defined in USA: Secondary | ICD-10-CM | POA: Diagnosis not present

## 2019-09-07 DIAGNOSIS — I639 Cerebral infarction, unspecified: Secondary | ICD-10-CM | POA: Diagnosis not present

## 2019-09-07 DIAGNOSIS — E7800 Pure hypercholesterolemia, unspecified: Secondary | ICD-10-CM | POA: Diagnosis not present

## 2019-09-07 DIAGNOSIS — M159 Polyosteoarthritis, unspecified: Secondary | ICD-10-CM | POA: Diagnosis not present

## 2019-09-25 DIAGNOSIS — H548 Legal blindness, as defined in USA: Secondary | ICD-10-CM | POA: Diagnosis not present

## 2019-10-03 DIAGNOSIS — G20 Parkinson's disease: Secondary | ICD-10-CM | POA: Diagnosis not present

## 2019-10-03 DIAGNOSIS — G3184 Mild cognitive impairment, so stated: Secondary | ICD-10-CM | POA: Diagnosis not present

## 2019-10-03 DIAGNOSIS — H543 Unqualified visual loss, both eyes: Secondary | ICD-10-CM | POA: Diagnosis not present

## 2019-10-15 DIAGNOSIS — I639 Cerebral infarction, unspecified: Secondary | ICD-10-CM | POA: Diagnosis not present

## 2019-10-15 DIAGNOSIS — E7800 Pure hypercholesterolemia, unspecified: Secondary | ICD-10-CM | POA: Diagnosis not present

## 2019-10-15 DIAGNOSIS — M159 Polyosteoarthritis, unspecified: Secondary | ICD-10-CM | POA: Diagnosis not present

## 2019-10-24 DIAGNOSIS — H548 Legal blindness, as defined in USA: Secondary | ICD-10-CM | POA: Diagnosis not present

## 2019-11-05 DIAGNOSIS — M1711 Unilateral primary osteoarthritis, right knee: Secondary | ICD-10-CM | POA: Diagnosis not present

## 2019-11-12 ENCOUNTER — Encounter: Payer: Self-pay | Admitting: Internal Medicine

## 2019-11-14 DIAGNOSIS — G20 Parkinson's disease: Secondary | ICD-10-CM | POA: Diagnosis not present

## 2019-11-14 DIAGNOSIS — M1990 Unspecified osteoarthritis, unspecified site: Secondary | ICD-10-CM | POA: Diagnosis not present

## 2019-11-14 DIAGNOSIS — Z299 Encounter for prophylactic measures, unspecified: Secondary | ICD-10-CM | POA: Diagnosis not present

## 2019-11-14 DIAGNOSIS — I10 Essential (primary) hypertension: Secondary | ICD-10-CM | POA: Diagnosis not present

## 2019-11-18 DIAGNOSIS — E7800 Pure hypercholesterolemia, unspecified: Secondary | ICD-10-CM | POA: Diagnosis not present

## 2019-11-18 DIAGNOSIS — M159 Polyosteoarthritis, unspecified: Secondary | ICD-10-CM | POA: Diagnosis not present

## 2019-11-18 DIAGNOSIS — I639 Cerebral infarction, unspecified: Secondary | ICD-10-CM | POA: Diagnosis not present

## 2019-11-26 DIAGNOSIS — H3552 Pigmentary retinal dystrophy: Secondary | ICD-10-CM | POA: Diagnosis not present

## 2019-11-28 DIAGNOSIS — H543 Unqualified visual loss, both eyes: Secondary | ICD-10-CM | POA: Diagnosis not present

## 2019-11-28 DIAGNOSIS — G3184 Mild cognitive impairment, so stated: Secondary | ICD-10-CM | POA: Diagnosis not present

## 2019-11-28 DIAGNOSIS — H548 Legal blindness, as defined in USA: Secondary | ICD-10-CM | POA: Diagnosis not present

## 2019-11-28 DIAGNOSIS — G20 Parkinson's disease: Secondary | ICD-10-CM | POA: Diagnosis not present

## 2019-12-11 DIAGNOSIS — Z1231 Encounter for screening mammogram for malignant neoplasm of breast: Secondary | ICD-10-CM | POA: Diagnosis not present

## 2019-12-19 DIAGNOSIS — E7800 Pure hypercholesterolemia, unspecified: Secondary | ICD-10-CM | POA: Diagnosis not present

## 2019-12-19 DIAGNOSIS — M159 Polyosteoarthritis, unspecified: Secondary | ICD-10-CM | POA: Diagnosis not present

## 2019-12-19 DIAGNOSIS — I639 Cerebral infarction, unspecified: Secondary | ICD-10-CM | POA: Diagnosis not present

## 2020-01-10 ENCOUNTER — Telehealth: Payer: Self-pay

## 2020-01-10 DIAGNOSIS — Z299 Encounter for prophylactic measures, unspecified: Secondary | ICD-10-CM | POA: Diagnosis not present

## 2020-01-10 DIAGNOSIS — Z79899 Other long term (current) drug therapy: Secondary | ICD-10-CM | POA: Diagnosis not present

## 2020-01-10 DIAGNOSIS — E7800 Pure hypercholesterolemia, unspecified: Secondary | ICD-10-CM | POA: Diagnosis not present

## 2020-01-10 DIAGNOSIS — E559 Vitamin D deficiency, unspecified: Secondary | ICD-10-CM | POA: Diagnosis not present

## 2020-01-10 DIAGNOSIS — Z7189 Other specified counseling: Secondary | ICD-10-CM | POA: Diagnosis not present

## 2020-01-10 DIAGNOSIS — R5383 Other fatigue: Secondary | ICD-10-CM | POA: Diagnosis not present

## 2020-01-10 DIAGNOSIS — Z0000 Encounter for general adult medical examination without abnormal findings: Secondary | ICD-10-CM | POA: Diagnosis not present

## 2020-01-10 NOTE — Telephone Encounter (Signed)
Lmom, waiting a return call.  

## 2020-01-10 NOTE — Telephone Encounter (Signed)
Pt called after receiving letter to schedule TCS. Pt went to her PCP office and was told, she doesn't need a TCS. Please advise. Precancerous poly removed 5 years.

## 2020-01-10 NOTE — Telephone Encounter (Addendum)
Looks like she is a patient of [facility] now. She had precancerous polyps removed 2014 and advised 7 year follow up colonoscopy. Given age of 38 and current guidelines, it would be reasonable to hold off on surveillance/screening exams unless she is having problems ie change in bowels, anemia, blood in stool, etc.

## 2020-01-11 ENCOUNTER — Telehealth: Payer: Self-pay | Admitting: Internal Medicine

## 2020-01-11 NOTE — Telephone Encounter (Signed)
PATIENT RETURNED CALL Friday AND I TOLD HER THAT SHE WAS ESTABLISHED WITH [facility] AND THAT SHE WOULD NEED TO FOLLOW UP WITH THAT OFFICE

## 2020-01-14 NOTE — Telephone Encounter (Signed)
Noted  

## 2020-01-18 DIAGNOSIS — I639 Cerebral infarction, unspecified: Secondary | ICD-10-CM | POA: Diagnosis not present

## 2020-01-18 DIAGNOSIS — M159 Polyosteoarthritis, unspecified: Secondary | ICD-10-CM | POA: Diagnosis not present

## 2020-01-18 DIAGNOSIS — E7800 Pure hypercholesterolemia, unspecified: Secondary | ICD-10-CM | POA: Diagnosis not present

## 2020-01-29 ENCOUNTER — Encounter: Payer: Self-pay | Admitting: Urology

## 2020-01-29 ENCOUNTER — Ambulatory Visit (INDEPENDENT_AMBULATORY_CARE_PROVIDER_SITE_OTHER): Payer: Medicare Other | Admitting: Urology

## 2020-01-29 ENCOUNTER — Other Ambulatory Visit: Payer: Self-pay

## 2020-01-29 VITALS — BP 129/79 | HR 81 | Temp 97.7°F | Ht 62.0 in | Wt 163.0 lb

## 2020-01-29 DIAGNOSIS — N3941 Urge incontinence: Secondary | ICD-10-CM | POA: Diagnosis not present

## 2020-01-29 DIAGNOSIS — R339 Retention of urine, unspecified: Secondary | ICD-10-CM | POA: Diagnosis not present

## 2020-01-29 LAB — MICROSCOPIC EXAMINATION
RBC, Urine: NONE SEEN /hpf (ref 0–2)
Renal Epithel, UA: NONE SEEN /hpf

## 2020-01-29 LAB — URINALYSIS, ROUTINE W REFLEX MICROSCOPIC
Bilirubin, UA: NEGATIVE
Glucose, UA: NEGATIVE
Ketones, UA: NEGATIVE
Nitrite, UA: NEGATIVE
Protein,UA: NEGATIVE
Specific Gravity, UA: 1.015 (ref 1.005–1.030)
Urobilinogen, Ur: 0.2 mg/dL (ref 0.2–1.0)
pH, UA: 5 (ref 5.0–7.5)

## 2020-01-29 LAB — BLADDER SCAN AMB NON-IMAGING: Scan Result: 240.6

## 2020-01-29 MED ORDER — TAMSULOSIN HCL 0.4 MG PO CAPS: 0.4000 mg | ORAL_CAPSULE | Freq: Every day | ORAL | 11 refills | Status: DC

## 2020-01-29 NOTE — Progress Notes (Signed)
Urological Symptom Review  Patient is experiencing the following symptoms: Frequent urination Get up at night to urinate Leakage of urine Stream starts and stops   Review of Systems  Gastrointestinal (upper)  : Negative for upper GI symptoms  Gastrointestinal (lower) : Negative for lower GI symptoms  Constitutional : Negative for symptoms  Skin: Negative for skin symptoms  Eyes: Negative for eye symptoms  Ear/Nose/Throat : Negative for Ear/Nose/Throat symptoms  Hematologic/Lymphatic: Negative for Hematologic/Lymphatic symptoms  Cardiovascular : Negative for cardiovascular symptoms  Respiratory : Negative for respiratory symptoms  Endocrine: Negative for endocrine symptoms  Musculoskeletal: Negative for musculoskeletal symptoms  Neurological: Negative for neurological symptoms  Psychologic: Negative for psychiatric symptoms 

## 2020-01-29 NOTE — Progress Notes (Signed)
01/29/2020 3:43 PM   Anna Jacobs May 10, 1941 [id number]  Referring provider: Monico Blitz, MD [place],  [place] [address]  Urge incontinence  HPI: Anna Jacobs is a 79yo here for followup for urge incontinence. She is currently on Mirabegron 50mg  daily. She has occasional urgency with urge incontinence. She has 2-3 incontinent episodes weekly. Within the last month she has noted worsening urgency, frequency, nocturia 3-5x. NO UTIs since last. No feeling of incomplete emptying. No dysuria or hematuria.  No issues with constipation  His records from [facility] are as follows: I leak when I have the urge to urinate.  HPI: Anna Jacobs is a 79 year-old female established patient who is here for urge incontinence.  She does have problems getting to the bathroom in time after she has the urge to urinate. She has had an accident when she couldn't get to the bathroom in time . Her urge incontinence began approximately 05/21/2017. Her symptoms have gotten worse over the last year.   She does wear protective pads. She wears 2-3 pads per day. She generally urinates every hour in the daytime. Patient denies getting up to urinate in the night. She is not having problems with emptying her bladder well.   06/02/2018: For the past year she has noted worsening urgency and urge incontinence. She had associated nocturia 2-4x, urinary frequency q 1hour. No hematuria or dysuria. She was placed on mirabegron 25mg  1 month ago which improved her incontinence to 2-3x per day and nocturia to 1-2x per day. She denies a feeling of incomplete emptying. PVR is 76cc.  She has issues with constipation and has a BM every 2-3 days.   07/12/2018: She notes imrpovement in her urgency and urge incontinence since increasing mirabegron 50mg . She has not taken the miralax   01/03/2019: She is currently on mirabegron 50mg  daily. She wears a pullup at night but has not leaked in several months.     PMH: Past Medical History:   Diagnosis Date  . Allergy   . Anxiety   . B12 deficiency   . Degenerative joint disease   . Depression   . GERD (gastroesophageal reflux disease)    EGD 07/02/2003 Dr. Gala Romney  . Hearing loss   . Hyperlipemia   . Hypertension   . IBS (irritable bowel syndrome)    Colonoscopy normal Dr. Gala Romney 06/2003  . Insomnia   . Parkinson's disease 2001  . Peripheral neuropathy   . Restless leg syndrome   . RP (retinitis pigmentosa)   . Sleep apnea   . TIA (transient ischemic attack)   . Urinary incontinence     Surgical History: Past Surgical History:  Procedure Laterality Date  . Benign breast biopsy     left (remotely) and right (06/2012)  . BIOPSY N/A 11/29/2012   Procedure: BIOPSY;  Surgeon: Daneil Dolin, MD;  [facility]: [facility];  Service: Endoscopy;  Laterality: N/A;  RANDOM BIOPSY  . CHOLECYSTECTOMY  2000  . COLONOSCOPY N/A 11/29/2012   [id number] rectal polyps-removed Otherwise normal examination distal terminal ileum appeared normal. Random colon biopsies were negative for microscopic colitis. Rectum polyp, tubular adenoma. Next TCS 11/2019.  Marland Kitchen LEFT OOPHORECTOMY    . TOTAL ABDOMINAL HYSTERECTOMY      Home Medications:  Allergies as of 01/29/2020      Reactions   Flagyl [metronidazole Hcl] Itching, Rash      Medication List       Accurate as of January 29, 2020  3:43 PM.  If you have any questions, ask your nurse or doctor.        ALPRAZolam 0.5 MG tablet Commonly known as: XANAX Take 1 mg by mouth 2 (two) times daily as needed.   atorvastatin 40 MG tablet Commonly known as: LIPITOR Take 40 mg by mouth daily.   carbidopa-levodopa 25-100 MG tablet Commonly known as: SINEMET IR Take 1 tablet by mouth 2 (two) times daily as needed.   diclofenac sodium 1 % Gel Commonly known as: VOLTAREN Apply topically 2 (two) times daily as needed.   enalapril 5 MG tablet Commonly known as: VASOTEC Take 5 mg by mouth 1 day or 1 dose.   escitalopram 20 MG  tablet Commonly known as: LEXAPRO Take 20 mg by mouth daily.   famotidine 20 MG tablet Commonly known as: Pepcid Take 1 tablet (20 mg total) by mouth daily.   lactose free nutrition Liqd Take 237 mLs by mouth 2 (two) times daily between meals.   Myrbetriq 50 MG Tb24 tablet Generic drug: mirabegron ER TAKE 1 TABLET BY MOUTH  EVERY DAY   omeprazole 20 MG capsule Commonly known as: PRILOSEC Take 20 mg by mouth daily.   QUEtiapine 25 MG tablet Commonly known as: SEROQUEL Take 50 mg by mouth at bedtime.   traMADol 50 MG tablet Commonly known as: ULTRAM Take 1 tablet by mouth as needed.   traZODone 50 MG tablet Commonly known as: DESYREL Take 50 mg by mouth Nightly.       Allergies:  Allergies  Allergen Reactions  . Flagyl [Metronidazole Hcl] Itching and Rash    Family History: Family History  Problem Relation Age of Onset  . Coronary artery disease Father   . Lymphoma Mother   . Esophageal cancer Brother   . Lung cancer Brother   . Ovarian cancer Daughter 40  . Colon cancer Neg Hx   . Rectal cancer Neg Hx   . Stomach cancer Neg Hx     Social History:  reports that she has never smoked. She has never used smokeless tobacco. She reports that she does not drink alcohol and does not use drugs.  ROS: All other review of systems were reviewed and are negative except what is noted above in HPI  Physical Exam: BP 129/79   Pulse 81   Temp 97.7 F (36.5 C)   Ht 5\' 2"  (1.575 m)   Wt 163 lb (73.9 kg)   BMI 29.81 kg/m   Constitutional:  Alert and oriented, No acute distress. HEENT: [place] AT, moist mucus membranes.  Trachea midline, no masses. Cardiovascular: No clubbing, cyanosis, or edema. Respiratory: Normal respiratory effort, no increased work of breathing. GI: Abdomen is soft, nontender, nondistended, no abdominal masses GU: No CVA tenderness.  Lymph: No cervical or inguinal lymphadenopathy. Skin: No rashes, bruises or suspicious lesions. Neurologic: Grossly  intact, no focal deficits, moving all 4 extremities. Psychiatric: Normal mood and affect.  Laboratory Data: Lab Results  Component Value Date   WBC 4.9 09/22/2010   HGB 13.6 09/22/2010   HCT 40 09/22/2010   MCV 87.9 09/22/2010    Lab Results  Component Value Date   CREATININE 0.75 09/22/2010    No results found for: PSA  No results found for: TESTOSTERONE  No results found for: HGBA1C  Urinalysis No results found for: COLORURINE, APPEARANCEUR, LABSPEC, PHURINE, GLUCOSEU, HGBUR, BILIRUBINUR, KETONESUR, PROTEINUR, UROBILINOGEN, NITRITE, LEUKOCYTESUR  No results found for: LABMICR, Colon, RBCUA, LABEPIT, MUCUS, BACTERIA  Pertinent Imaging:  No results found for this  or any previous visit.  No results found for this or any previous visit.  No results found for this or any previous visit.  No results found for this or any previous visit.  No results found for this or any previous visit.  No results found for this or any previous visit.  No results found for this or any previous visit.  No results found for this or any previous visit.   Assessment & Plan:    1. Urge incontinence -continue mirabegron 50mg  daily  2. Incomplete emptying -We will trial flomax 0.4mg  daily -RTC 6 weeks with PVR   No follow-ups on file.  Nicolette Bang, MD  [facility] [place]

## 2020-01-29 NOTE — Addendum Note (Signed)
Addended byIris Pert on: 01/29/2020 04:47 PM   Modules accepted: Orders

## 2020-01-29 NOTE — Patient Instructions (Signed)

## 2020-02-06 DIAGNOSIS — E7800 Pure hypercholesterolemia, unspecified: Secondary | ICD-10-CM | POA: Diagnosis not present

## 2020-02-06 DIAGNOSIS — I639 Cerebral infarction, unspecified: Secondary | ICD-10-CM | POA: Diagnosis not present

## 2020-02-06 DIAGNOSIS — M159 Polyosteoarthritis, unspecified: Secondary | ICD-10-CM | POA: Diagnosis not present

## 2020-02-08 DIAGNOSIS — M1711 Unilateral primary osteoarthritis, right knee: Secondary | ICD-10-CM | POA: Diagnosis not present

## 2020-02-11 ENCOUNTER — Ambulatory Visit: Payer: Medicare Other | Admitting: Urology

## 2020-02-13 ENCOUNTER — Telehealth: Payer: Self-pay

## 2020-02-13 NOTE — Telephone Encounter (Signed)
Caregiver called and stated that pt asked if its ok to stop tamsulosin due to pt stating that it makes her to weak. Caregiver informed to tell pt to stop the tamsulosin until her f/u next month.

## 2020-02-21 DIAGNOSIS — Z299 Encounter for prophylactic measures, unspecified: Secondary | ICD-10-CM | POA: Diagnosis not present

## 2020-02-21 DIAGNOSIS — R0600 Dyspnea, unspecified: Secondary | ICD-10-CM | POA: Diagnosis not present

## 2020-02-21 DIAGNOSIS — H548 Legal blindness, as defined in USA: Secondary | ICD-10-CM | POA: Diagnosis not present

## 2020-02-21 DIAGNOSIS — M1710 Unilateral primary osteoarthritis, unspecified knee: Secondary | ICD-10-CM | POA: Diagnosis not present

## 2020-02-21 DIAGNOSIS — I10 Essential (primary) hypertension: Secondary | ICD-10-CM | POA: Diagnosis not present

## 2020-02-21 DIAGNOSIS — R0602 Shortness of breath: Secondary | ICD-10-CM | POA: Diagnosis not present

## 2020-03-03 ENCOUNTER — Ambulatory Visit: Payer: Medicare Other | Admitting: Urology

## 2020-03-03 DIAGNOSIS — R0602 Shortness of breath: Secondary | ICD-10-CM | POA: Diagnosis not present

## 2020-03-06 DIAGNOSIS — G20 Parkinson's disease: Secondary | ICD-10-CM | POA: Diagnosis not present

## 2020-03-06 DIAGNOSIS — I10 Essential (primary) hypertension: Secondary | ICD-10-CM | POA: Diagnosis not present

## 2020-03-06 DIAGNOSIS — M1710 Unilateral primary osteoarthritis, unspecified knee: Secondary | ICD-10-CM | POA: Diagnosis not present

## 2020-03-06 DIAGNOSIS — Z299 Encounter for prophylactic measures, unspecified: Secondary | ICD-10-CM | POA: Diagnosis not present

## 2020-03-17 ENCOUNTER — Other Ambulatory Visit: Payer: Self-pay

## 2020-03-17 ENCOUNTER — Ambulatory Visit (INDEPENDENT_AMBULATORY_CARE_PROVIDER_SITE_OTHER): Payer: Medicare Other | Admitting: Urology

## 2020-03-17 ENCOUNTER — Encounter: Payer: Self-pay | Admitting: Urology

## 2020-03-17 ENCOUNTER — Ambulatory Visit: Payer: Medicare Other | Admitting: Urology

## 2020-03-17 VITALS — BP 165/90 | HR 80 | Temp 97.8°F

## 2020-03-17 DIAGNOSIS — N3941 Urge incontinence: Secondary | ICD-10-CM | POA: Diagnosis not present

## 2020-03-17 DIAGNOSIS — R339 Retention of urine, unspecified: Secondary | ICD-10-CM

## 2020-03-17 LAB — BLADDER SCAN AMB NON-IMAGING: Scan Result: 158

## 2020-03-17 MED ORDER — SILODOSIN 8 MG PO CAPS: 8.0000 mg | ORAL_CAPSULE | Freq: Every day | ORAL | 11 refills | Status: DC

## 2020-03-17 NOTE — Patient Instructions (Signed)

## 2020-03-17 NOTE — Progress Notes (Signed)
03/17/2020 2:37 PM   Anna Jacobs 04/25/1941 [id number]  Referring provider: Monico Blitz, MD [address] [place],  [place] [address]  Followup incomplete emptying  HPI: Anna Jacobs is a 79yo here for followup for urge incontinence and incomplete emptying. The urge incontinence is improved on mirabegron 50mg . She denies any urinary urgency or frequency. She continue to have incomplete emptying which is unimproved with flomax. She denies any dysuria   PMH: Past Medical History:  Diagnosis Date   Allergy    Anxiety    B12 deficiency    Degenerative joint disease    Depression    GERD (gastroesophageal reflux disease)    EGD 07/02/2003 Dr. Gala Romney   Hearing loss    Hyperlipemia    Hypertension    IBS (irritable bowel syndrome)    Colonoscopy normal Dr. Gala Romney 06/2003   Insomnia    Parkinson's disease 2001   Peripheral neuropathy    Restless leg syndrome    RP (retinitis pigmentosa)    Sleep apnea    TIA (transient ischemic attack)    Urinary incontinence     Surgical History: Past Surgical History:  Procedure Laterality Date   Benign breast biopsy     left (remotely) and right (06/2012)   BIOPSY N/A 11/29/2012   Procedure: BIOPSY;  Surgeon: Daneil Dolin, MD;  Location: [facility];  Service: Endoscopy;  Laterality: N/A;  RANDOM BIOPSY   CHOLECYSTECTOMY  2000   COLONOSCOPY N/A 11/29/2012   [id number] rectal polyps-removed Otherwise normal examination distal terminal ileum appeared normal. Random colon biopsies were negative for microscopic colitis. Rectum polyp, tubular adenoma. Next TCS 11/2019.   LEFT OOPHORECTOMY     TOTAL ABDOMINAL HYSTERECTOMY      Home Medications:  Allergies as of 03/17/2020      Reactions   Flagyl [metronidazole Hcl] Itching, Rash      Medication List       Accurate as of March 17, 2020  2:37 PM. If you have any questions, ask your nurse or doctor.        ALPRAZolam 0.5 MG tablet Commonly known as:  XANAX Take 1 mg by mouth 2 (two) times daily as needed.   atorvastatin 40 MG tablet Commonly known as: LIPITOR Take 40 mg by mouth daily.   carbidopa-levodopa 25-100 MG tablet Commonly known as: SINEMET IR Take 1 tablet by mouth 2 (two) times daily as needed.   diclofenac sodium 1 % Gel Commonly known as: VOLTAREN Apply topically 2 (two) times daily as needed.   enalapril 5 MG tablet Commonly known as: VASOTEC Take 5 mg by mouth 1 day or 1 dose.   escitalopram 20 MG tablet Commonly known as: LEXAPRO Take 20 mg by mouth daily.   famotidine 20 MG tablet Commonly known as: Pepcid Take 1 tablet (20 mg total) by mouth daily.   lactose free nutrition Liqd Take 237 mLs by mouth 2 (two) times daily between meals.   Myrbetriq 50 MG Tb24 tablet Generic drug: mirabegron ER TAKE 1 TABLET BY MOUTH  EVERY DAY   omeprazole 20 MG capsule Commonly known as: PRILOSEC Take 20 mg by mouth daily.   QUEtiapine 25 MG tablet Commonly known as: SEROQUEL Take 50 mg by mouth at bedtime.   tamsulosin 0.4 MG Caps capsule Commonly known as: FLOMAX Take 1 capsule (0.4 mg total) by mouth daily after supper.   traMADol 50 MG tablet Commonly known as: ULTRAM Take 1 tablet by mouth as needed.   traZODone  50 MG tablet Commonly known as: DESYREL Take 50 mg by mouth Nightly.       Allergies:  Allergies  Allergen Reactions   Flagyl [Metronidazole Hcl] Itching and Rash    Family History: Family History  Problem Relation Age of Onset   Coronary artery disease Father    Lymphoma Mother    Esophageal cancer Brother    Lung cancer Brother    Ovarian cancer Daughter 29   Colon cancer Neg Hx    Rectal cancer Neg Hx    Stomach cancer Neg Hx     Social History:  reports that she has never smoked. She has never used smokeless tobacco. She reports that she does not drink alcohol and does not use drugs.  ROS: All other review of systems were reviewed and are negative except what  is noted above in HPI  Physical Exam: BP (!) 165/90    Pulse 80    Temp 97.8 F (36.6 C)   Constitutional:  Alert and oriented, No acute distress. HEENT:  AT, moist mucus membranes.  Trachea midline, no masses. Cardiovascular: No clubbing, cyanosis, or edema. Respiratory: Normal respiratory effort, no increased work of breathing. GI: Abdomen is soft, nontender, nondistended, no abdominal masses GU: No CVA tenderness.  Lymph: No cervical or inguinal lymphadenopathy. Skin: No rashes, bruises or suspicious lesions. Neurologic: Grossly intact, no focal deficits, moving all 4 extremities. Psychiatric: Normal mood and affect.  Laboratory Data: Lab Results  Component Value Date   WBC 4.9 09/22/2010   HGB 13.6 09/22/2010   HCT 40 09/22/2010   MCV 87.9 09/22/2010    Lab Results  Component Value Date   CREATININE 0.75 09/22/2010    No results found for: PSA  No results found for: TESTOSTERONE  No results found for: HGBA1C  Urinalysis    Component Value Date/Time   APPEARANCEUR Clear 01/29/2020 1648   GLUCOSEU Negative 01/29/2020 1648   BILIRUBINUR Negative 01/29/2020 1648   PROTEINUR Negative 01/29/2020 1648   NITRITE Negative 01/29/2020 1648   LEUKOCYTESUR Trace (A) 01/29/2020 1648    Lab Results  Component Value Date   LABMICR See below: 01/29/2020   WBCUA 0-5 01/29/2020   LABEPIT 0-10 01/29/2020   BACTERIA Few (A) 01/29/2020    Pertinent Imaging:  No results found for this or any previous visit.  No results found for this or any previous visit.  No results found for this or any previous visit.  No results found for this or any previous visit.  No results found for this or any previous visit.  No results found for this or any previous visit.  No results found for this or any previous visit.  No results found for this or any previous visit.   Assessment & Plan:    1. Urge incontinence -continue mirabegron 50mg  - Urinalysis, Routine w reflex  microscopic - Bladder Scan (Post Void Residual) in office  2. Incomplete emptying of bladder -we will trial rapaflo 8mg  daily   No follow-ups on file.  Nicolette Bang, MD  [facility] [place]

## 2020-03-17 NOTE — Progress Notes (Signed)
scnUrological Symptom Review  Patient is experiencing the following symptoms: Frequent urination Hard to postpone urination Get up at night to urinate Leakage of urine Stream starts and stops   Review of Systems  Gastrointestinal (upper)  : Negative for upper GI symptoms  Gastrointestinal (lower) : Negative for lower GI symptoms  Constitutional : Fatigue  Skin: Negative for skin symptoms  Eyes: Negative for eye symptoms  Ear/Nose/Throat : Sinus problems  Hematologic/Lymphatic: Negative for Hematologic/Lymphatic symptoms  Cardiovascular : Leg swelling  Respiratory : Cough Shortness of breath  Endocrine: Excessive thirst  Musculoskeletal: Back pain  Neurological: Negative for neurological symptoms  Psychologic: Negative for psychiatric symptoms

## 2020-03-20 DIAGNOSIS — M159 Polyosteoarthritis, unspecified: Secondary | ICD-10-CM | POA: Diagnosis not present

## 2020-03-20 DIAGNOSIS — I639 Cerebral infarction, unspecified: Secondary | ICD-10-CM | POA: Diagnosis not present

## 2020-03-20 DIAGNOSIS — E7800 Pure hypercholesterolemia, unspecified: Secondary | ICD-10-CM | POA: Diagnosis not present

## 2020-04-18 DIAGNOSIS — I639 Cerebral infarction, unspecified: Secondary | ICD-10-CM | POA: Diagnosis not present

## 2020-04-18 DIAGNOSIS — M159 Polyosteoarthritis, unspecified: Secondary | ICD-10-CM | POA: Diagnosis not present

## 2020-04-18 DIAGNOSIS — E7800 Pure hypercholesterolemia, unspecified: Secondary | ICD-10-CM | POA: Diagnosis not present

## 2020-04-22 DIAGNOSIS — Z299 Encounter for prophylactic measures, unspecified: Secondary | ICD-10-CM | POA: Diagnosis not present

## 2020-04-22 DIAGNOSIS — I10 Essential (primary) hypertension: Secondary | ICD-10-CM | POA: Diagnosis not present

## 2020-04-22 DIAGNOSIS — M1710 Unilateral primary osteoarthritis, unspecified knee: Secondary | ICD-10-CM | POA: Diagnosis not present

## 2020-04-22 DIAGNOSIS — H04123 Dry eye syndrome of bilateral lacrimal glands: Secondary | ICD-10-CM | POA: Diagnosis not present

## 2020-04-22 DIAGNOSIS — Z23 Encounter for immunization: Secondary | ICD-10-CM | POA: Diagnosis not present

## 2020-04-28 ENCOUNTER — Ambulatory Visit: Payer: Medicare Other | Admitting: Urology

## 2020-05-07 DIAGNOSIS — G3184 Mild cognitive impairment, so stated: Secondary | ICD-10-CM | POA: Diagnosis not present

## 2020-05-07 DIAGNOSIS — G20 Parkinson's disease: Secondary | ICD-10-CM | POA: Diagnosis not present

## 2020-05-07 DIAGNOSIS — H543 Unqualified visual loss, both eyes: Secondary | ICD-10-CM | POA: Diagnosis not present

## 2020-05-20 DIAGNOSIS — E7800 Pure hypercholesterolemia, unspecified: Secondary | ICD-10-CM | POA: Diagnosis not present

## 2020-05-20 DIAGNOSIS — M159 Polyosteoarthritis, unspecified: Secondary | ICD-10-CM | POA: Diagnosis not present

## 2020-05-20 DIAGNOSIS — I639 Cerebral infarction, unspecified: Secondary | ICD-10-CM | POA: Diagnosis not present

## 2020-05-22 DIAGNOSIS — M1711 Unilateral primary osteoarthritis, right knee: Secondary | ICD-10-CM | POA: Diagnosis not present

## 2020-05-26 ENCOUNTER — Other Ambulatory Visit: Payer: Self-pay | Admitting: Urology

## 2020-06-12 DIAGNOSIS — M1711 Unilateral primary osteoarthritis, right knee: Secondary | ICD-10-CM | POA: Diagnosis not present

## 2020-06-18 NOTE — Patient Instructions (Addendum)
DUE TO COVID-19 ONLY ONE VISITOR IS ALLOWED TO COME WITH YOU AND STAY IN THE WAITING ROOM ONLY DURING PRE OP AND PROCEDURE.   IF YOU WILL BE ADMITTED INTO THE HOSPITAL YOU ARE ALLOWED ONE SUPPORT PERSON DURING VISITATION HOURS ONLY (10AM -8PM)   . The support person may change daily. . The support person must pass our screening, gel in and out, and wear a mask at all times, including in the patient's room. . Patients must also wear a mask when staff or their support person are in the room.   COVID SWAB TESTING MUST BE COMPLETED ON:   Tuesday, 07-01-20 @ 11:00 AM   [address] [place], [place] [address]  (Must self quarantine after testing. Follow instructions on handout.)        Your procedure is scheduled on:  Friday, 07-04-20   Report to [facility] Main  Entrance    Report to admitting at 8:00 AM   Call this number if you have problems the morning of surgery [phone]   Do not eat food :After Midnight.   May have liquids until 7:15 AM day of surgery  CLEAR LIQUID DIET  Foods Allowed                                                                     Foods Excluded  Water, Black Coffee and tea, regular and decaf             liquids that you cannot  Plain Jell-O in any flavor  (No red)                                    see through such as: Fruit ices (not with fruit pulp)                                      milk, soups, orange juice              Iced Popsicles (No red)                                      All solid food                                   Apple juices Sports drinks like Gatorade (No red) Lightly seasoned clear broth or consume(fat free) Sugar, honey syrup   Complete one Ensure drink the morning of surgery at  7:15 AM   the day of surgery.  Oral Hygiene is also important to reduce your risk of infection.                                    Remember - BRUSH YOUR TEETH THE MORNING OF SURGERY WITH YOUR REGULAR TOOTHPASTE   Do NOT smoke after  Midnight   Take these medicines the morning of  surgery with A SIP OF WATER: Atorvastatin, Lexapro, Pepcid, Myrbetriq, Omeprazole, Rapaflo                                You may not have any metal on your body including hair pins, jewelry, and body piercings             Do not wear make-up, lotions, powders, perfumes/cologne, or deodorant             Do not wear nail polish.  Do not shave  48 hours prior to surgery.         Do not bring valuables to the hospital. [facility] IS NOT RESPONSIBLE   FOR VALUABLES.   Contacts, dentures or bridgework may not be worn into surgery.   Bring small overnight bag day of surgery.    Special Instructions: Bring a copy of your healthcare power of attorney and living will documents         the day of surgery if you haven't scanned them in before.              Please read over the following fact sheets you were given: IF YOU HAVE QUESTIONS ABOUT YOUR PRE OP INSTRUCTIONS PLEASE CALL [phone]   [place] - Preparing for Surgery Before surgery, you can play an important role.  Because skin is not sterile, your skin needs to be as free of germs as possible.  You can reduce the number of germs on your skin by washing with CHG (chlorahexidine gluconate) soap before surgery.  CHG is an antiseptic cleaner which kills germs and bonds with the skin to continue killing germs even after washing. Please DO NOT use if you have an allergy to CHG or antibacterial soaps.  If your skin becomes reddened/irritated stop using the CHG and inform your nurse when you arrive at Short Stay. Do not shave (including legs and underarms) for at least 48 hours prior to the first CHG shower.  You may shave your face/neck.  Please follow these instructions carefully:  1.  Shower with CHG Soap the night before surgery and the  morning of surgery.  2.  If you choose to wash your hair, wash your hair first as usual with your normal  shampoo.  3.  After you shampoo, rinse your hair and  body thoroughly to remove the shampoo.                             4.  Use CHG as you would any other liquid soap.  You can apply chg directly to the skin and wash.  Gently with a scrungie or clean washcloth.  5.  Apply the CHG Soap to your body ONLY FROM THE NECK DOWN.   Do   not use on face/ open                           Wound or open sores. Avoid contact with eyes, ears mouth and   genitals (private parts).                       Wash face,  Genitals (private parts) with your normal soap.             6.  Wash thoroughly, paying special attention to the area where your  surgery  will be performed.  7.  Thoroughly rinse your body with warm water from the neck down.  8.  DO NOT shower/wash with your normal soap after using and rinsing off the CHG Soap.                9.  Pat yourself dry with a clean towel.            10.  Wear clean pajamas.            11.  Place clean sheets on your bed the night of your first shower and do not  sleep with pets. Day of Surgery : Do not apply any lotions/deodorants the morning of surgery.  Please wear clean clothes to the hospital/surgery center.  FAILURE TO FOLLOW THESE INSTRUCTIONS MAY RESULT IN THE CANCELLATION OF YOUR SURGERY  PATIENT SIGNATURE_________________________________  NURSE SIGNATURE__________________________________  ________________________________________________________________________   Adam Phenix  An incentive spirometer is a tool that can help keep your lungs clear and active. This tool measures how well you are filling your lungs with each breath. Taking long deep breaths may help reverse or decrease the chance of developing breathing (pulmonary) problems (especially infection) following:  A long period of time when you are unable to move or be active. BEFORE THE PROCEDURE   If the spirometer includes an indicator to show your best effort, your nurse or respiratory therapist will set it to a desired goal.  If  possible, sit up straight or lean slightly forward. Try not to slouch.  Hold the incentive spirometer in an upright position. INSTRUCTIONS FOR USE  1. Sit on the edge of your bed if possible, or sit up as far as you can in bed or on a chair. 2. Hold the incentive spirometer in an upright position. 3. Breathe out normally. 4. Place the mouthpiece in your mouth and seal your lips tightly around it. 5. Breathe in slowly and as deeply as possible, raising the piston or the ball toward the top of the column. 6. Hold your breath for 3-5 seconds or for as long as possible. Allow the piston or ball to fall to the bottom of the column. 7. Remove the mouthpiece from your mouth and breathe out normally. 8. Rest for a few seconds and repeat Steps 1 through 7 at least 10 times every 1-2 hours when you are awake. Take your time and take a few normal breaths between deep breaths. 9. The spirometer may include an indicator to show your best effort. Use the indicator as a goal to work toward during each repetition. 10. After each set of 10 deep breaths, practice coughing to be sure your lungs are clear. If you have an incision (the cut made at the time of surgery), support your incision when coughing by placing a pillow or rolled up towels firmly against it. Once you are able to get out of bed, walk around indoors and cough well. You may stop using the incentive spirometer when instructed by your caregiver.  RISKS AND COMPLICATIONS  Take your time so you do not get dizzy or light-headed.  If you are in pain, you may need to take or ask for pain medication before doing incentive spirometry. It is harder to take a deep breath if you are having pain. AFTER USE  Rest and breathe slowly and easily.  It can be helpful to keep track of a log of your progress. Your caregiver can provide you with a simple table to help  with this. If you are using the spirometer at home, follow these instructions: [place]  IF:   You are having difficultly using the spirometer.  You have trouble using the spirometer as often as instructed.  Your pain medication is not giving enough relief while using the spirometer.  You develop fever of 100.5 F (38.1 C) or higher. SEEK IMMEDIATE MEDICAL CARE IF:   You cough up bloody sputum that had not been present before.  You develop fever of 102 F (38.9 C) or greater.  You develop worsening pain at or near the incision site. MAKE SURE YOU:   Understand these instructions.  Will watch your condition.  Will get help right away if you are not doing well or get worse. Document Released: 10/18/2006 Document Revised: 08/30/2011 Document Reviewed: 12/19/2006 Exit[employer] Patient Information 2014 Exit[employer], [place].   ________________________________________________________________________  WHAT IS A BLOOD TRANSFUSION? Blood Transfusion Information  A transfusion is the replacement of blood or some of its parts. Blood is made up of multiple cells which provide different functions.  Red blood cells carry oxygen and are used for blood loss replacement.  White blood cells fight against infection.  Platelets control bleeding.  Plasma helps clot blood.  Other blood products are available for specialized needs, such as hemophilia or other clotting disorders. BEFORE THE TRANSFUSION  Who gives blood for transfusions?   Healthy volunteers who are fully evaluated to make sure their blood is safe. This is blood bank blood. Transfusion therapy is the safest it has ever been in the practice of medicine. Before blood is taken from a donor, a complete history is taken to make sure that person has no history of diseases nor engages in risky social behavior (examples are intravenous drug use or sexual activity with multiple partners). The donor's travel history is screened to minimize risk of transmitting infections, such as malaria. The donated blood is tested for signs of  infectious diseases, such as HIV and hepatitis. The blood is then tested to be sure it is compatible with you in order to minimize the chance of a transfusion reaction. If you or a relative donates blood, this is often done in anticipation of surgery and is not appropriate for emergency situations. It takes many days to process the donated blood. RISKS AND COMPLICATIONS Although transfusion therapy is very safe and saves many lives, the main dangers of transfusion include:   Getting an infectious disease.  Developing a transfusion reaction. This is an allergic reaction to something in the blood you were given. Every precaution is taken to prevent this. The decision to have a blood transfusion has been considered carefully by your caregiver before blood is given. Blood is not given unless the benefits outweigh the risks. AFTER THE TRANSFUSION  Right after receiving a blood transfusion, you will usually feel much better and more energetic. This is especially true if your red blood cells have gotten low (anemic). The transfusion raises the level of the red blood cells which carry oxygen, and this usually causes an energy increase.  The nurse administering the transfusion will monitor you carefully for complications. HOME CARE INSTRUCTIONS  No special instructions are needed after a transfusion. You may find your energy is better. Speak with your caregiver about any limitations on activity for underlying diseases you may have. SEEK MEDICAL CARE IF:   Your condition is not improving after your transfusion.  You develop redness or irritation at the intravenous (IV) site. SEEK IMMEDIATE MEDICAL CARE IF:  Any of the following symptoms occur over the next 12 hours:  Shaking chills.  You have a temperature by mouth above 102 F (38.9 C), not controlled by medicine.  Chest, back, or muscle pain.  People around you feel you are not acting correctly or are confused.  Shortness of breath or  difficulty breathing.  Dizziness and fainting.  You get a rash or develop hives.  You have a decrease in urine output.  Your urine turns a dark color or changes to pink, red, or brown. Any of the following symptoms occur over the next 10 days:  You have a temperature by mouth above 102 F (38.9 C), not controlled by medicine.  Shortness of breath.  Weakness after normal activity.  The white part of the eye turns yellow (jaundice).  You have a decrease in the amount of urine or are urinating less often.  Your urine turns a dark color or changes to pink, red, or brown. Document Released: 06/04/2000 Document Revised: 08/30/2011 Document Reviewed: 01/22/2008 [facility] Patient Information 2014 [place], [place].  _______________________________________________________________________

## 2020-06-18 NOTE — Progress Notes (Addendum)
COVID Vaccine Completed:  x2 Date COVID Vaccine completed:  April 2021 2nd dose COVID vaccine manufacturer:    Moderna     PCP - Kirstie Peri, MD (clearance on chart dated 02-2020) Office notes also on chart Cardiologist - N/A  Chest x-ray - 02-21-20 Care Everywhere EKG - Pt thought EKG was done at Dr. Margaretmary Eddy office recently.    Dr. Margaretmary Eddy office returned call and said no EKG done this year, only Echo.  Will do day of surgery. Stress Test -  ECHO - 03-03-20 on chart Cardiac Cath -  Pacemaker/ICD device last checked:  Sleep Study - 10-16-17 in Epic CPAP - No  Fasting Blood Sugar - N/A Checks Blood Sugar _____ times a day  Blood Thinner Instructions: N/A Aspirin Instructions: Last Dose:  Anesthesia review:  Parkinson's disease.  Echo done in September to evaluate SOB.    Patient denies shortness of breath, fever, cough and chest pain at PAT appointment  Pt unable to climb stairs due to knee pain.  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   Patient verbalized understanding of instructions that were given to them at the PAT appointment. Patient was also instructed that they will need to review over the PAT instructions again at home before surgery.

## 2020-06-19 DIAGNOSIS — M159 Polyosteoarthritis, unspecified: Secondary | ICD-10-CM | POA: Diagnosis not present

## 2020-06-19 DIAGNOSIS — E7800 Pure hypercholesterolemia, unspecified: Secondary | ICD-10-CM | POA: Diagnosis not present

## 2020-06-19 DIAGNOSIS — I639 Cerebral infarction, unspecified: Secondary | ICD-10-CM | POA: Diagnosis not present

## 2020-06-23 ENCOUNTER — Ambulatory Visit: Payer: Self-pay | Admitting: Physician Assistant

## 2020-06-23 NOTE — H&P (Signed)
TOTAL KNEE ADMISSION H&P  Patient is being admitted for right total knee arthroplasty.  Subjective:  Chief Complaint:right knee pain.  HPI: PHEONIX Anna Jacobs, 80 y.o. female, has a history of pain and functional disability in the right knee due to arthritis and has failed non-surgical conservative treatments for greater than 12 weeks to includeNSAID's and/or analgesics, corticosteriod injections and activity modification.  Onset of symptoms was gradual, starting >10 years ago with gradually worsening course since that time. The patient noted no past surgery on the right knee(s).  Patient currently rates pain in the right knee(s) at 10 out of 10 with activity. Patient has night pain, worsening of pain with activity and weight bearing, pain that interferes with activities of daily living, pain with passive range of motion, crepitus and joint swelling.  Patient has evidence of periarticular osteophytes and joint space narrowing by imaging studies.There is no active infection.  Patient Active Problem List   Diagnosis Date Noted  . Urge incontinence 01/29/2020  . Incomplete emptying of bladder 01/29/2020  . Lower abdominal pain 11/21/2012  . Abdominal bloating 11/21/2012  . Bowel habit changes 11/21/2012  . Fecal incontinence 11/21/2012  . IBS (irritable bowel syndrome) 09/22/2010  . GERD (gastroesophageal reflux disease) 09/22/2010   Past Medical History:  Diagnosis Date  . Allergy   . Anxiety   . B12 deficiency   . Degenerative joint disease   . Depression   . GERD (gastroesophageal reflux disease)    EGD 07/02/2003 Dr. Jena Gauss  . Hearing loss   . Hyperlipemia   . Hypertension   . IBS (irritable bowel syndrome)    Colonoscopy normal Dr. Jena Gauss 06/2003  . Insomnia   . Parkinson's disease 2001  . Peripheral neuropathy   . Restless leg syndrome   . RP (retinitis pigmentosa)   . Sleep apnea   . TIA (transient ischemic attack)   . Urinary incontinence     Past Surgical History:   Procedure Laterality Date  . Benign breast biopsy     left (remotely) and right (06/2012)  . BIOPSY N/A 11/29/2012   Procedure: BIOPSY;  Surgeon: Corbin Ade, MD;  Location: [facility];  Service: Endoscopy;  Laterality: N/A;  RANDOM BIOPSY  . CHOLECYSTECTOMY  2000  . COLONOSCOPY N/A 11/29/2012   [id number] rectal polyps-removed Otherwise normal examination distal terminal ileum appeared normal. Random colon biopsies were negative for microscopic colitis. Rectum polyp, tubular adenoma. Next TCS 11/2019.  Marland Kitchen LEFT OOPHORECTOMY    . TOTAL ABDOMINAL HYSTERECTOMY      Current Outpatient Medications  Medication Sig Dispense Refill Last Dose  . atorvastatin (LIPITOR) 40 MG tablet Take 40 mg by mouth daily.     . Cholecalciferol (VITAMIN D) 50 MCG (2000 UT) tablet Take 2,000 Units by mouth daily.     . enalapril (VASOTEC) 5 MG tablet Take 5 mg by mouth daily.     Marland Kitchen escitalopram (LEXAPRO) 20 MG tablet Take 20 mg by mouth daily.     . famotidine (PEPCID) 20 MG tablet Take 1 tablet (20 mg total) by mouth daily. (Patient taking differently: Take 20 mg by mouth 2 (two) times daily.) 30 tablet 3   . ibuprofen (ADVIL) 200 MG tablet Take 200 mg by mouth every 6 (six) hours as needed for headache.     . lactose free nutrition (BOOST) LIQD Take 237 mLs by mouth 2 (two) times daily between meals. (Patient taking differently: Take 237 mLs by mouth daily as needed (nutrition).) 14220 mL 2   .  MYRBETRIQ 50 MG TB24 tablet TAKE 1 TABLET BY MOUTH  DAILY (Patient taking differently: Take 50 mg by mouth daily.) 90 tablet 3   . silodosin (RAPAFLO) 8 MG CAPS capsule Take 1 capsule (8 mg total) by mouth daily with breakfast. 30 capsule 11   . traZODone (DESYREL) 50 MG tablet Take 50 mg by mouth at bedtime.     . vitamin B-12 (CYANOCOBALAMIN) 500 MCG tablet Take 500 mcg by mouth daily.      Current Facility-Administered Medications  Medication Dose Route Frequency Provider Last Rate Last Admin  . 0.9 %  sodium  chloride infusion  500 mL Intravenous Once Milus Banister, MD       Allergies  Allergen Reactions  . Flagyl [Metronidazole Hcl] Itching and Rash    Social History   Tobacco Use  . Smoking status: Never Smoker  . Smokeless tobacco: Never Used  Substance Use Topics  . Alcohol use: No    Family History  Problem Relation Age of Onset  . Coronary artery disease Father   . Lymphoma Mother   . Esophageal cancer Brother   . Lung cancer Brother   . Ovarian cancer Daughter 59  . Colon cancer Neg Hx   . Rectal cancer Neg Hx   . Stomach cancer Neg Hx      Review of Systems  HENT: Positive for hearing loss and voice change.   Respiratory: Positive for shortness of breath.   Gastrointestinal: Positive for constipation.  Genitourinary: Positive for frequency.  Musculoskeletal: Positive for arthralgias and joint swelling.  Neurological: Positive for syncope and headaches.  All other systems reviewed and are negative.   Objective:  Physical Exam Constitutional:      General: She is not in acute distress.    Appearance: Normal appearance.  HENT:     Head: Normocephalic and atraumatic.  Eyes:     Extraocular Movements: Extraocular movements intact.     Pupils: Pupils are equal, round, and reactive to light.  Cardiovascular:     Rate and Rhythm: Normal rate and regular rhythm.     Pulses: Normal pulses.     Heart sounds: Normal heart sounds.  Pulmonary:     Effort: Pulmonary effort is normal. No respiratory distress.     Breath sounds: Normal breath sounds.  Abdominal:     General: Bowel sounds are normal. There is no distension.     Tenderness: There is no abdominal tenderness.  Musculoskeletal:     Cervical back: Normal range of motion and neck supple.     Right knee: Swelling and bony tenderness present. No effusion. Decreased range of motion. Tenderness present.  Lymphadenopathy:     Cervical: No cervical adenopathy.  Skin:    General: Skin is warm and dry.      Findings: No erythema or rash.  Neurological:     General: No focal deficit present.     Mental Status: She is alert and oriented to person, place, and time.  Psychiatric:        Mood and Affect: Mood normal.        Behavior: Behavior normal.     Vital signs in last 24 hours: @VSRANGES @  Labs:   Estimated body mass index is 29.81 kg/m as calculated from the following:   Height as of 01/29/20: 5\' 2"  (1.575 m).   Weight as of 01/29/20: 73.9 kg.   Imaging Review Plain radiographs demonstrate moderate degenerative joint disease of the right knee(s). The overall alignment ismild  varus. The bone quality appears to be good for age and reported activity level.      Assessment/Plan:  End stage arthritis, right knee   The patient history, physical examination, clinical judgment of the provider and imaging studies are consistent with end stage degenerative joint disease of the right knee(s) and total knee arthroplasty is deemed medically necessary. The treatment options including medical management, injection therapy arthroscopy and arthroplasty were discussed at length. The risks and benefits of total knee arthroplasty were presented and reviewed. The risks due to aseptic loosening, infection, stiffness, patella tracking problems, thromboembolic complications and other imponderables were discussed. The patient acknowledged the explanation, agreed to proceed with the plan and consent was signed. Patient is being admitted for inpatient treatment for surgery, pain control, PT, OT, prophylactic antibiotics, VTE prophylaxis, progressive ambulation and ADL's and discharge planning. The patient is planning to be discharged home with home health services    Anticipated LOS equal to or greater than 2 midnights due to - Age 92 and older with one or more of the following:  - Obesity  - Expected need for hospital services (PT, OT, Nursing) required for safe  discharge  - Anticipated need for  postoperative skilled nursing care or inpatient rehab  - Active co-morbidities: leagally blind OR   - Unanticipated findings during/Post Surgery: None  - Patient is a high risk of re-admission due to: Barriers to post-acute care (logistical, no family support in home)

## 2020-06-23 NOTE — Care Plan (Signed)
Ortho Bundle Case Management Note  Patient Details  Name: Anna Jacobs MRN: [id number] Date of Birth: 1940-09-26 Met with patient and caregiver in the office prior to surgery. Patient is legally blind and requires assistance. She can see shadows.  SHe has a caregiver 6 hours a day and some hours on the weekend. She received meals on wheels. PT referral to [facility]. Rolling walker and CPM ordered for home. Patient and MD in agreement with plan. Choice offered                     DME Arranged:  Walker rolling,CPM DME Agency:  [facility]  HH Arranged:  PT,OT [place] Agency:  [facility]  Additional Comments: Please contact me with any questions of if this plan should need to change.  Ladell Heads,  [place]  [phone] 06/23/2020, 10:59 AM

## 2020-06-23 NOTE — H&P (View-Only) (Signed)
TOTAL KNEE ADMISSION H&P  Patient is being admitted for right total knee arthroplasty.  Subjective:  Chief Complaint:right knee pain.  HPI: Anna Jacobs, 80 y.o. female, has a history of pain and functional disability in the right knee due to arthritis and has failed non-surgical conservative treatments for greater than 12 weeks to includeNSAID's and/or analgesics, corticosteriod injections and activity modification.  Onset of symptoms was gradual, starting >10 years ago with gradually worsening course since that time. The patient noted no past surgery on the right knee(s).  Patient currently rates pain in the right knee(s) at 10 out of 10 with activity. Patient has night pain, worsening of pain with activity and weight bearing, pain that interferes with activities of daily living, pain with passive range of motion, crepitus and joint swelling.  Patient has evidence of periarticular osteophytes and joint space narrowing by imaging studies.There is no active infection.  Patient Active Problem List   Diagnosis Date Noted  . Urge incontinence 01/29/2020  . Incomplete emptying of bladder 01/29/2020  . Lower abdominal pain 11/21/2012  . Abdominal bloating 11/21/2012  . Bowel habit changes 11/21/2012  . Fecal incontinence 11/21/2012  . IBS (irritable bowel syndrome) 09/22/2010  . GERD (gastroesophageal reflux disease) 09/22/2010   Past Medical History:  Diagnosis Date  . Allergy   . Anxiety   . B12 deficiency   . Degenerative joint disease   . Depression   . GERD (gastroesophageal reflux disease)    EGD 07/02/2003 Dr. Jena Gauss  . Hearing loss   . Hyperlipemia   . Hypertension   . IBS (irritable bowel syndrome)    Colonoscopy normal Dr. Jena Gauss 06/2003  . Insomnia   . Parkinson's disease 2001  . Peripheral neuropathy   . Restless leg syndrome   . RP (retinitis pigmentosa)   . Sleep apnea   . TIA (transient ischemic attack)   . Urinary incontinence     Past Surgical History:   Procedure Laterality Date  . Benign breast biopsy     left (remotely) and right (06/2012)  . BIOPSY N/A 11/29/2012   Procedure: BIOPSY;  Surgeon: Corbin Ade, MD;  Location: [facility];  Service: Endoscopy;  Laterality: N/A;  RANDOM BIOPSY  . CHOLECYSTECTOMY  2000  . COLONOSCOPY N/A 11/29/2012   ZOX:[id number] rectal polyps-removed Otherwise normal examination distal terminal ileum appeared normal. Random colon biopsies were negative for microscopic colitis. Rectum polyp, tubular adenoma. Next TCS 11/2019.  Marland Kitchen LEFT OOPHORECTOMY    . TOTAL ABDOMINAL HYSTERECTOMY      Current Outpatient Medications  Medication Sig Dispense Refill Last Dose  . atorvastatin (LIPITOR) 40 MG tablet Take 40 mg by mouth daily.     . Cholecalciferol (VITAMIN D) 50 MCG (2000 UT) tablet Take 2,000 Units by mouth daily.     . enalapril (VASOTEC) 5 MG tablet Take 5 mg by mouth daily.     Marland Kitchen escitalopram (LEXAPRO) 20 MG tablet Take 20 mg by mouth daily.     . famotidine (PEPCID) 20 MG tablet Take 1 tablet (20 mg total) by mouth daily. (Patient taking differently: Take 20 mg by mouth 2 (two) times daily.) 30 tablet 3   . ibuprofen (ADVIL) 200 MG tablet Take 200 mg by mouth every 6 (six) hours as needed for headache.     . lactose free nutrition (BOOST) LIQD Take 237 mLs by mouth 2 (two) times daily between meals. (Patient taking differently: Take 237 mLs by mouth daily as needed (nutrition).) 14220 mL 2   .  MYRBETRIQ 50 MG TB24 tablet TAKE 1 TABLET BY MOUTH  DAILY (Patient taking differently: Take 50 mg by mouth daily.) 90 tablet 3   . silodosin (RAPAFLO) 8 MG CAPS capsule Take 1 capsule (8 mg total) by mouth daily with breakfast. 30 capsule 11   . traZODone (DESYREL) 50 MG tablet Take 50 mg by mouth at bedtime.     . vitamin B-12 (CYANOCOBALAMIN) 500 MCG tablet Take 500 mcg by mouth daily.      Current Facility-Administered Medications  Medication Dose Route Frequency Provider Last Rate Last Admin  . 0.9 %  sodium  chloride infusion  500 mL Intravenous Once Milus Banister, MD       Allergies  Allergen Reactions  . Flagyl [Metronidazole Hcl] Itching and Rash    Social History   Tobacco Use  . Smoking status: Never Smoker  . Smokeless tobacco: Never Used  Substance Use Topics  . Alcohol use: No    Family History  Problem Relation Age of Onset  . Coronary artery disease Father   . Lymphoma Mother   . Esophageal cancer Brother   . Lung cancer Brother   . Ovarian cancer Daughter 43  . Colon cancer Neg Hx   . Rectal cancer Neg Hx   . Stomach cancer Neg Hx      Review of Systems  HENT: Positive for hearing loss and voice change.   Respiratory: Positive for shortness of breath.   Gastrointestinal: Positive for constipation.  Genitourinary: Positive for frequency.  Musculoskeletal: Positive for arthralgias and joint swelling.  Neurological: Positive for syncope and headaches.  All other systems reviewed and are negative.   Objective:  Physical Exam Constitutional:      General: She is not in acute distress.    Appearance: Normal appearance.  HENT:     Head: Normocephalic and atraumatic.  Eyes:     Extraocular Movements: Extraocular movements intact.     Pupils: Pupils are equal, round, and reactive to light.  Cardiovascular:     Rate and Rhythm: Normal rate and regular rhythm.     Pulses: Normal pulses.     Heart sounds: Normal heart sounds.  Pulmonary:     Effort: Pulmonary effort is normal. No respiratory distress.     Breath sounds: Normal breath sounds.  Abdominal:     General: Bowel sounds are normal. There is no distension.     Tenderness: There is no abdominal tenderness.  Musculoskeletal:     Cervical back: Normal range of motion and neck supple.     Right knee: Swelling and bony tenderness present. No effusion. Decreased range of motion. Tenderness present.  Lymphadenopathy:     Cervical: No cervical adenopathy.  Skin:    General: Skin is warm and dry.      Findings: No erythema or rash.  Neurological:     General: No focal deficit present.     Mental Status: She is alert and oriented to person, place, and time.  Psychiatric:        Mood and Affect: Mood normal.        Behavior: Behavior normal.     Vital signs in last 24 hours: @VSRANGES @  Labs:   Estimated body mass index is 29.81 kg/m as calculated from the following:   Height as of 01/29/20: 5\' 2"  (1.575 m).   Weight as of 01/29/20: 73.9 kg.   Imaging Review Plain radiographs demonstrate moderate degenerative joint disease of the right knee(s). The overall alignment ismild  varus. The bone quality appears to be good for age and reported activity level.      Assessment/Plan:  End stage arthritis, right knee   The patient history, physical examination, clinical judgment of the provider and imaging studies are consistent with end stage degenerative joint disease of the right knee(s) and total knee arthroplasty is deemed medically necessary. The treatment options including medical management, injection therapy arthroscopy and arthroplasty were discussed at length. The risks and benefits of total knee arthroplasty were presented and reviewed. The risks due to aseptic loosening, infection, stiffness, patella tracking problems, thromboembolic complications and other imponderables were discussed. The patient acknowledged the explanation, agreed to proceed with the plan and consent was signed. Patient is being admitted for inpatient treatment for surgery, pain control, PT, OT, prophylactic antibiotics, VTE prophylaxis, progressive ambulation and ADL's and discharge planning. The patient is planning to be discharged home with home health services    Anticipated LOS equal to or greater than 2 midnights due to - Age 77 and older with one or more of the following:  - Obesity  - Expected need for hospital services (PT, OT, Nursing) required for safe  discharge  - Anticipated need for  postoperative skilled nursing care or inpatient rehab  - Active co-morbidities: leagally blind OR   - Unanticipated findings during/Post Surgery: None  - Patient is a high risk of re-admission due to: Barriers to post-acute care (logistical, no family support in home)

## 2020-06-24 ENCOUNTER — Other Ambulatory Visit: Payer: Self-pay

## 2020-06-24 ENCOUNTER — Encounter (HOSPITAL_COMMUNITY): Payer: Self-pay

## 2020-06-24 ENCOUNTER — Encounter (HOSPITAL_COMMUNITY)
Admission: RE | Admit: 2020-06-24 | Discharge: 2020-06-24 | Disposition: A | Discharge status: Home / Self Care | Payer: Medicare Other | Source: Ambulatory Visit | Attending: Orthopedic Surgery | Admitting: Orthopedic Surgery

## 2020-06-24 DIAGNOSIS — Z01812 Encounter for preprocedural laboratory examination: Secondary | ICD-10-CM | POA: Insufficient documentation

## 2020-06-24 LAB — CBC WITH DIFFERENTIAL/PLATELET
Abs Immature Granulocytes: 0.01 10*3/uL (ref 0.00–0.07)
Basophils Absolute: 0 10*3/uL (ref 0.0–0.1)
Basophils Relative: 1 %
Eosinophils Absolute: 0.1 10*3/uL (ref 0.0–0.5)
Eosinophils Relative: 2 %
HCT: 43.3 % (ref 36.0–46.0)
Hemoglobin: 14 g/dL (ref 12.0–15.0)
Immature Granulocytes: 0 %
Lymphocytes Relative: 28 %
Lymphs Abs: 1.5 10*3/uL (ref 0.7–4.0)
MCH: 30 pg (ref 26.0–34.0)
MCHC: 32.3 g/dL (ref 30.0–36.0)
MCV: 92.7 fL (ref 80.0–100.0)
Monocytes Absolute: 0.4 10*3/uL (ref 0.1–1.0)
Monocytes Relative: 8 %
Neutro Abs: 3.3 10*3/uL (ref 1.7–7.7)
Neutrophils Relative %: 61 %
Platelets: 333 10*3/uL (ref 150–400)
RBC: 4.67 MIL/uL (ref 3.87–5.11)
RDW: 13 % (ref 11.5–15.5)
WBC: 5.4 10*3/uL (ref 4.0–10.5)
nRBC: 0 % (ref 0.0–0.2)

## 2020-06-24 LAB — COMPREHENSIVE METABOLIC PANEL
ALT: 25 U/L (ref 0–44)
AST: 20 U/L (ref 15–41)
Albumin: 4.3 g/dL (ref 3.5–5.0)
Alkaline Phosphatase: 50 U/L (ref 38–126)
Anion gap: 7 (ref 5–15)
BUN: 23 mg/dL (ref 8–23)
CO2: 30 mmol/L (ref 22–32)
Calcium: 9.5 mg/dL (ref 8.9–10.3)
Chloride: 104 mmol/L (ref 98–111)
Creatinine, Ser: 0.81 mg/dL (ref 0.44–1.00)
GFR, Estimated: >60 mL/min (ref >60)
Glucose, Bld: 101 mg/dL — ABNORMAL HIGH (ref 70–99)
Potassium: 4.1 mmol/L (ref 3.5–5.1)
Sodium: 141 mmol/L (ref 135–145)
Total Bilirubin: 1.2 mg/dL (ref 0.3–1.2)
Total Protein: 6.7 g/dL (ref 6.5–8.1)

## 2020-06-24 LAB — PROTIME-INR
INR: 1.1 (ref 0.8–1.2)
Prothrombin Time: 13.4 seconds (ref 11.4–15.2)

## 2020-06-24 LAB — APTT: aPTT: 33 seconds (ref 24–36)

## 2020-06-24 LAB — SURGICAL PCR SCREEN
MRSA, PCR: NEGATIVE
Staphylococcus aureus: NEGATIVE

## 2020-07-01 ENCOUNTER — Other Ambulatory Visit (HOSPITAL_COMMUNITY): Payer: Medicare Other

## 2020-07-02 ENCOUNTER — Other Ambulatory Visit (HOSPITAL_COMMUNITY)
Admission: RE | Admit: 2020-07-02 | Discharge: 2020-07-02 | Disposition: A | Discharge status: Home / Self Care | Payer: Medicare Other | Source: Ambulatory Visit | Attending: Orthopedic Surgery | Admitting: Orthopedic Surgery

## 2020-07-02 DIAGNOSIS — Z01812 Encounter for preprocedural laboratory examination: Secondary | ICD-10-CM | POA: Diagnosis not present

## 2020-07-02 DIAGNOSIS — Z20822 Contact with and (suspected) exposure to covid-19: Secondary | ICD-10-CM | POA: Insufficient documentation

## 2020-07-02 LAB — SARS CORONAVIRUS 2 (TAT 6-24 HRS): SARS Coronavirus 2: NEGATIVE

## 2020-07-03 MED ORDER — BUPIVACAINE LIPOSOME 1.3 % IJ SUSP
20.0000 mL | INTRAMUSCULAR | Status: DC
  Filled 2020-07-03: qty 20

## 2020-07-03 MED ORDER — TRANEXAMIC ACID 1000 MG/10ML IV SOLN
2000.0000 mg | INTRAVENOUS | Status: DC
  Filled 2020-07-03: qty 20

## 2020-07-04 ENCOUNTER — Ambulatory Visit (HOSPITAL_COMMUNITY): Payer: Medicare Other | Admitting: Anesthesiology

## 2020-07-04 ENCOUNTER — Ambulatory Visit (HOSPITAL_COMMUNITY): Payer: Medicare Other | Admitting: Physician Assistant

## 2020-07-04 ENCOUNTER — Encounter (HOSPITAL_COMMUNITY)
Admission: RE | Disposition: A | Discharge status: Home / Self Care | Payer: Self-pay | Source: Home / Self Care | Attending: Orthopedic Surgery

## 2020-07-04 ENCOUNTER — Encounter (HOSPITAL_COMMUNITY): Payer: Self-pay | Admitting: Orthopedic Surgery

## 2020-07-04 ENCOUNTER — Observation Stay (HOSPITAL_COMMUNITY)
Admission: RE | Admit: 2020-07-04 | Discharge: 2020-07-07 | Disposition: A | Discharge status: Home / Self Care | Payer: Medicare Other | Attending: Orthopedic Surgery | Admitting: Orthopedic Surgery

## 2020-07-04 ENCOUNTER — Other Ambulatory Visit: Payer: Self-pay

## 2020-07-04 DIAGNOSIS — M21161 Varus deformity, not elsewhere classified, right knee: Secondary | ICD-10-CM | POA: Insufficient documentation

## 2020-07-04 DIAGNOSIS — I10 Essential (primary) hypertension: Secondary | ICD-10-CM | POA: Diagnosis not present

## 2020-07-04 DIAGNOSIS — G8918 Other acute postprocedural pain: Secondary | ICD-10-CM | POA: Diagnosis not present

## 2020-07-04 DIAGNOSIS — Z79899 Other long term (current) drug therapy: Secondary | ICD-10-CM | POA: Insufficient documentation

## 2020-07-04 DIAGNOSIS — M1711 Unilateral primary osteoarthritis, right knee: Principal | ICD-10-CM | POA: Insufficient documentation

## 2020-07-04 DIAGNOSIS — E785 Hyperlipidemia, unspecified: Secondary | ICD-10-CM | POA: Insufficient documentation

## 2020-07-04 DIAGNOSIS — Z96651 Presence of right artificial knee joint: Secondary | ICD-10-CM

## 2020-07-04 DIAGNOSIS — G20 Parkinson's disease: Secondary | ICD-10-CM | POA: Diagnosis not present

## 2020-07-04 HISTORY — PX: TOTAL KNEE ARTHROPLASTY: SHX125

## 2020-07-04 LAB — TYPE AND SCREEN
ABO/RH(D): O NEG
Antibody Screen: NEGATIVE

## 2020-07-04 SURGERY — ARTHROPLASTY, KNEE, TOTAL
Anesthesia: Monitor Anesthesia Care | Site: Knee | Laterality: Right

## 2020-07-04 MED ORDER — ASPIRIN 81 MG PO CHEW
81.0000 mg | CHEWABLE_TABLET | Freq: Two times a day (BID) | ORAL | Status: DC
  Administered 2020-07-04 – 2020-07-07 (×6): 81 mg via ORAL
  Filled 2020-07-04 (×6): qty 1

## 2020-07-04 MED ORDER — METOCLOPRAMIDE HCL 5 MG PO TABS
5.0000 mg | ORAL_TABLET | Freq: Three times a day (TID) | ORAL | Status: DC | PRN
Start: 2020-07-04 — End: 2020-07-07

## 2020-07-04 MED ORDER — TRANEXAMIC ACID-NACL 1000-0.7 MG/100ML-% IV SOLN
1000.0000 mg | Freq: Once | INTRAVENOUS | Status: AC
  Administered 2020-07-04: 1000 mg via INTRAVENOUS
  Filled 2020-07-04: qty 100

## 2020-07-04 MED ORDER — HYDROMORPHONE HCL 1 MG/ML IJ SOLN: 0.2500 mg | INTRAMUSCULAR | Status: DC | PRN

## 2020-07-04 MED ORDER — DOCUSATE SODIUM 100 MG PO CAPS
100.0000 mg | ORAL_CAPSULE | Freq: Two times a day (BID) | ORAL | Status: DC
  Administered 2020-07-04 – 2020-07-07 (×6): 100 mg via ORAL
  Filled 2020-07-04 (×6): qty 1

## 2020-07-04 MED ORDER — CEFAZOLIN SODIUM-DEXTROSE 2-4 GM/100ML-% IV SOLN
2.0000 g | INTRAVENOUS | Status: AC
  Administered 2020-07-04: 2 g via INTRAVENOUS
  Filled 2020-07-04: qty 100

## 2020-07-04 MED ORDER — PHENOL 1.4 % MT LIQD: 1.0000 | OROMUCOSAL | Status: DC | PRN

## 2020-07-04 MED ORDER — TRAZODONE HCL 50 MG PO TABS
50.0000 mg | ORAL_TABLET | Freq: Every day | ORAL | Status: DC
  Administered 2020-07-04 – 2020-07-06 (×2): 50 mg via ORAL
  Filled 2020-07-04 (×2): qty 1

## 2020-07-04 MED ORDER — ATORVASTATIN CALCIUM 40 MG PO TABS
40.0000 mg | ORAL_TABLET | Freq: Every day | ORAL | Status: DC
Start: 2020-07-05 — End: 2020-07-07
  Administered 2020-07-05 – 2020-07-07 (×3): 40 mg via ORAL
  Filled 2020-07-04 (×3): qty 1

## 2020-07-04 MED ORDER — PHENYLEPHRINE HCL-NACL 10-0.9 MG/250ML-% IV SOLN
INTRAVENOUS | Status: DC | PRN
  Administered 2020-07-04: 30 ug/min via INTRAVENOUS

## 2020-07-04 MED ORDER — ASPIRIN EC 81 MG PO TBEC: 81.0000 mg | DELAYED_RELEASE_TABLET | Freq: Two times a day (BID) | ORAL | 0 refills | Status: AC

## 2020-07-04 MED ORDER — ACETAMINOPHEN 500 MG PO TABS
1000.0000 mg | ORAL_TABLET | Freq: Four times a day (QID) | ORAL | Status: AC
  Administered 2020-07-04 – 2020-07-05 (×3): 1000 mg via ORAL
  Filled 2020-07-04 (×3): qty 2

## 2020-07-04 MED ORDER — BUPIVACAINE-EPINEPHRINE (PF) 0.25% -1:200000 IJ SOLN
INTRAMUSCULAR | Status: AC
  Filled 2020-07-04: qty 30

## 2020-07-04 MED ORDER — 0.9 % SODIUM CHLORIDE (POUR BTL) OPTIME
TOPICAL | Status: DC | PRN
Start: 2020-07-04 — End: 2020-07-04
  Administered 2020-07-04: 1000 mL

## 2020-07-04 MED ORDER — OXYCODONE HCL 5 MG PO TABS
5.0000 mg | ORAL_TABLET | ORAL | Status: DC | PRN
  Administered 2020-07-05 – 2020-07-06 (×4): 5 mg via ORAL
  Filled 2020-07-04 (×4): qty 1

## 2020-07-04 MED ORDER — SORBITOL 70 % SOLN
30.0000 mL | Freq: Every day | Status: DC | PRN
  Filled 2020-07-04: qty 30

## 2020-07-04 MED ORDER — SODIUM CHLORIDE 0.9 % IR SOLN
Status: DC | PRN
  Administered 2020-07-04: 1000 mL

## 2020-07-04 MED ORDER — METOCLOPRAMIDE HCL 5 MG/ML IJ SOLN: 5.0000 mg | Freq: Three times a day (TID) | INTRAMUSCULAR | Status: DC | PRN

## 2020-07-04 MED ORDER — POLYETHYLENE GLYCOL 3350 17 G PO PACK: 17.0000 g | PACK | Freq: Every day | ORAL | Status: DC | PRN

## 2020-07-04 MED ORDER — TRANEXAMIC ACID 1000 MG/10ML IV SOLN
INTRAVENOUS | Status: DC | PRN
  Administered 2020-07-04: 2000 mg via TOPICAL

## 2020-07-04 MED ORDER — SODIUM CHLORIDE 0.9% FLUSH
INTRAVENOUS | Status: DC | PRN
  Administered 2020-07-04: 50 mL

## 2020-07-04 MED ORDER — CEFAZOLIN SODIUM-DEXTROSE 1-4 GM/50ML-% IV SOLN
1.0000 g | Freq: Four times a day (QID) | INTRAVENOUS | Status: AC
  Administered 2020-07-04 – 2020-07-05 (×2): 1 g via INTRAVENOUS
  Filled 2020-07-04 (×3): qty 50

## 2020-07-04 MED ORDER — BUPIVACAINE IN DEXTROSE 0.75-8.25 % IT SOLN
INTRATHECAL | Status: DC | PRN
  Administered 2020-07-04: 1.6 mL via INTRATHECAL

## 2020-07-04 MED ORDER — MIDAZOLAM HCL 2 MG/2ML IJ SOLN
1.0000 mg | INTRAMUSCULAR | Status: DC
  Filled 2020-07-04: qty 2

## 2020-07-04 MED ORDER — BUPIVACAINE-EPINEPHRINE (PF) 0.25% -1:200000 IJ SOLN
INTRAMUSCULAR | Status: DC | PRN
  Administered 2020-07-04: 30 mL via PERINEURAL

## 2020-07-04 MED ORDER — ONDANSETRON HCL 4 MG/2ML IJ SOLN
4.0000 mg | Freq: Four times a day (QID) | INTRAMUSCULAR | Status: DC | PRN
  Administered 2020-07-05: 4 mg via INTRAVENOUS
  Filled 2020-07-04: qty 2

## 2020-07-04 MED ORDER — SODIUM CHLORIDE (PF) 0.9 % IJ SOLN
INTRAMUSCULAR | Status: AC
  Filled 2020-07-04: qty 50

## 2020-07-04 MED ORDER — DEXAMETHASONE SODIUM PHOSPHATE 10 MG/ML IJ SOLN
INTRAMUSCULAR | Status: DC | PRN
  Administered 2020-07-04: 5 mg

## 2020-07-04 MED ORDER — POVIDONE-IODINE 10 % EX SWAB
2.0000 "application " | Freq: Once | CUTANEOUS | Status: AC
  Administered 2020-07-04: 2 via TOPICAL

## 2020-07-04 MED ORDER — FENTANYL CITRATE (PF) 100 MCG/2ML IJ SOLN
50.0000 ug | INTRAMUSCULAR | Status: DC
  Administered 2020-07-04: 50 ug via INTRAVENOUS
  Filled 2020-07-04: qty 2

## 2020-07-04 MED ORDER — LIDOCAINE 2% (20 MG/ML) 5 ML SYRINGE
INTRAMUSCULAR | Status: DC | PRN
  Administered 2020-07-04: 50 mg via INTRAVENOUS

## 2020-07-04 MED ORDER — SODIUM CHLORIDE 0.9 % IV SOLN: INTRAVENOUS | Status: DC

## 2020-07-04 MED ORDER — DEXAMETHASONE SODIUM PHOSPHATE 10 MG/ML IJ SOLN
INTRAMUSCULAR | Status: DC | PRN
  Administered 2020-07-04: 5 mg via INTRAVENOUS

## 2020-07-04 MED ORDER — ONDANSETRON HCL 4 MG/2ML IJ SOLN
INTRAMUSCULAR | Status: DC | PRN
  Administered 2020-07-04: 4 mg via INTRAVENOUS

## 2020-07-04 MED ORDER — WATER FOR IRRIGATION, STERILE IR SOLN
Status: DC | PRN
  Administered 2020-07-04: 1000 mL

## 2020-07-04 MED ORDER — PHENYLEPHRINE 40 MCG/ML (10ML) SYRINGE FOR IV PUSH (FOR BLOOD PRESSURE SUPPORT): PREFILLED_SYRINGE | INTRAVENOUS | Status: DC | PRN

## 2020-07-04 MED ORDER — ESCITALOPRAM OXALATE 20 MG PO TABS
20.0000 mg | ORAL_TABLET | Freq: Every day | ORAL | Status: DC
  Administered 2020-07-05 – 2020-07-07 (×3): 20 mg via ORAL
  Filled 2020-07-04 (×3): qty 1

## 2020-07-04 MED ORDER — ONDANSETRON HCL 4 MG/2ML IJ SOLN: 4.0000 mg | Freq: Once | INTRAMUSCULAR | Status: DC | PRN

## 2020-07-04 MED ORDER — BUPIVACAINE LIPOSOME 1.3 % IJ SUSP
INTRAMUSCULAR | Status: DC | PRN
  Administered 2020-07-04: 20 mL

## 2020-07-04 MED ORDER — MIRABEGRON ER 25 MG PO TB24
50.0000 mg | ORAL_TABLET | Freq: Every day | ORAL | Status: DC
  Administered 2020-07-05 – 2020-07-07 (×3): 50 mg via ORAL
  Filled 2020-07-04 (×3): qty 2

## 2020-07-04 MED ORDER — HYDROMORPHONE HCL 1 MG/ML IJ SOLN: 0.5000 mg | INTRAMUSCULAR | Status: DC | PRN

## 2020-07-04 MED ORDER — MENTHOL 3 MG MT LOZG: 1.0000 | LOZENGE | OROMUCOSAL | Status: DC | PRN

## 2020-07-04 MED ORDER — PROPOFOL 500 MG/50ML IV EMUL
INTRAVENOUS | Status: DC | PRN
  Administered 2020-07-04: 75 ug/kg/min via INTRAVENOUS

## 2020-07-04 MED ORDER — ROPIVACAINE HCL 7.5 MG/ML IJ SOLN
INTRAMUSCULAR | Status: DC | PRN
  Administered 2020-07-04: 20 mL via PERINEURAL

## 2020-07-04 MED ORDER — DOCUSATE SODIUM 100 MG PO CAPS: 100.0000 mg | ORAL_CAPSULE | Freq: Every day | ORAL | 2 refills | Status: AC | PRN

## 2020-07-04 MED ORDER — TRANEXAMIC ACID-NACL 1000-0.7 MG/100ML-% IV SOLN
1000.0000 mg | INTRAVENOUS | Status: AC
  Administered 2020-07-04: 1000 mg via INTRAVENOUS
  Filled 2020-07-04: qty 100

## 2020-07-04 MED ORDER — ACETAMINOPHEN 325 MG PO TABS: 650.0000 mg | ORAL_TABLET | ORAL | 0 refills | Status: AC | PRN

## 2020-07-04 MED ORDER — ENALAPRIL MALEATE 5 MG PO TABS
5.0000 mg | ORAL_TABLET | Freq: Every day | ORAL | Status: DC
  Administered 2020-07-05 – 2020-07-07 (×3): 5 mg via ORAL
  Filled 2020-07-04 (×4): qty 1

## 2020-07-04 MED ORDER — FAMOTIDINE 20 MG PO TABS
20.0000 mg | ORAL_TABLET | Freq: Every day | ORAL | Status: DC
  Administered 2020-07-05 – 2020-07-07 (×3): 20 mg via ORAL
  Filled 2020-07-04 (×3): qty 1

## 2020-07-04 MED ORDER — ACETAMINOPHEN 325 MG PO TABS
325.0000 mg | ORAL_TABLET | Freq: Four times a day (QID) | ORAL | Status: DC | PRN
  Administered 2020-07-05: 650 mg via ORAL
  Administered 2020-07-06: 325 mg via ORAL
  Administered 2020-07-06 – 2020-07-07 (×3): 650 mg via ORAL
  Filled 2020-07-04 (×5): qty 2

## 2020-07-04 MED ORDER — ONDANSETRON HCL 4 MG PO TABS: 4.0000 mg | ORAL_TABLET | Freq: Four times a day (QID) | ORAL | Status: DC | PRN

## 2020-07-04 MED ORDER — CHLORHEXIDINE GLUCONATE 0.12 % MT SOLN
15.0000 mL | Freq: Once | OROMUCOSAL | Status: AC
  Administered 2020-07-04: 15 mL via OROMUCOSAL

## 2020-07-04 MED ORDER — ORAL CARE MOUTH RINSE: 15.0000 mL | Freq: Once | OROMUCOSAL | Status: AC

## 2020-07-04 MED ORDER — LACTATED RINGERS IV SOLN: INTRAVENOUS | Status: DC

## 2020-07-04 MED ORDER — ACETAMINOPHEN 10 MG/ML IV SOLN: 1000.0000 mg | Freq: Once | INTRAVENOUS | Status: DC | PRN

## 2020-07-04 MED ORDER — OXYCODONE HCL 5 MG PO TABS: ORAL_TABLET | ORAL | 0 refills | Status: DC

## 2020-07-04 MED ORDER — FLEET ENEMA 7-19 GM/118ML RE ENEM: 1.0000 | ENEMA | Freq: Once | RECTAL | Status: DC | PRN

## 2020-07-04 SURGICAL SUPPLY — 62 items
ATTUNE PS FEM RT SZ 5 CEM KNEE (Femur) ×2 IMPLANT
ATTUNE PSRP INSR SZ5 5 KNEE (Insert) ×2 IMPLANT
BAG DECANTER FOR FLEXI CONT (MISCELLANEOUS) ×2 IMPLANT
BAG ZIPLOCK 12X15 (MISCELLANEOUS) ×2 IMPLANT
BASE TIBIAL ROT PLAT SZ 5 KNEE (Knees) ×1 IMPLANT
BENZOIN TINCTURE PRP APPL 2/3 (GAUZE/BANDAGES/DRESSINGS) ×2 IMPLANT
BLADE SAGITTAL 25.0X1.19X90 (BLADE) ×2 IMPLANT
BLADE SAW SGTL 13X75X1.27 (BLADE) ×2 IMPLANT
BLADE SURG 15 STRL LF DISP TIS (BLADE) ×1 IMPLANT
BLADE SURG 15 STRL SS (BLADE) ×2
BLADE SURG SZ10 CARB STEEL (BLADE) ×2 IMPLANT
BNDG ELASTIC 6X15 VLCR STRL LF (GAUZE/BANDAGES/DRESSINGS) ×2 IMPLANT
BOWL SMART MIX CTS (DISPOSABLE) ×2 IMPLANT
CEMENT HV SMART SET (Cement) ×4 IMPLANT
CLSR STERI-STRIP ANTIMIC 1/2X4 (GAUZE/BANDAGES/DRESSINGS) ×2 IMPLANT
COVER SURGICAL LIGHT HANDLE (MISCELLANEOUS) ×2 IMPLANT
COVER WAND RF STERILE (DRAPES) ×2 IMPLANT
CUFF TOURN SGL QUICK 34 (TOURNIQUET CUFF) ×2
CUFF TRNQT CYL 34X4.125X (TOURNIQUET CUFF) ×1 IMPLANT
DECANTER SPIKE VIAL GLASS SM (MISCELLANEOUS) ×4 IMPLANT
DRAPE INCISE IOBAN 66X45 STRL (DRAPES) IMPLANT
DRAPE ORTHO SPLIT 77X108 STRL (DRAPES) ×2
DRAPE SURG ORHT 6 SPLT 77X108 (DRAPES) ×1 IMPLANT
DRAPE U-SHAPE 47X51 STRL (DRAPES) ×2 IMPLANT
DRESSING AQUACEL AG SP 3.5X10 (GAUZE/BANDAGES/DRESSINGS) ×1 IMPLANT
DRSG AQUACEL AG ADV 3.5X10 (GAUZE/BANDAGES/DRESSINGS) ×2 IMPLANT
DRSG AQUACEL AG SP 3.5X10 (GAUZE/BANDAGES/DRESSINGS) ×2
DURAPREP 26ML APPLICATOR (WOUND CARE) ×4 IMPLANT
ELECT REM PT RETURN 15FT ADLT (MISCELLANEOUS) ×2 IMPLANT
GLOVE SRG 8 PF TXTR STRL LF DI (GLOVE) ×2 IMPLANT
GLOVE SURG ORTHO 8.0 STRL STRW (GLOVE) ×2 IMPLANT
GLOVE SURG SS PI 7.5 STRL IVOR (GLOVE) ×2 IMPLANT
GLOVE SURG UNDER POLY LF SZ8 (GLOVE) ×4
GOWN STRL REUS W/TWL XL LVL3 (GOWN DISPOSABLE) ×4 IMPLANT
HANDPIECE INTERPULSE COAX TIP (DISPOSABLE) ×2
HOLDER FOLEY CATH W/STRAP (MISCELLANEOUS) IMPLANT
HOOD PEEL AWAY FLYTE STAYCOOL (MISCELLANEOUS) ×2 IMPLANT
IMMOBILIZER KNEE 20 (SOFTGOODS) ×2
IMMOBILIZER KNEE 20 THIGH 36 (SOFTGOODS) ×1 IMPLANT
KIT TURNOVER KIT A (KITS) IMPLANT
MANIFOLD NEPTUNE II (INSTRUMENTS) ×2 IMPLANT
NEEDLE HYPO 22GX1.5 SAFETY (NEEDLE) ×4 IMPLANT
NS IRRIG 1000ML POUR BTL (IV SOLUTION) ×2 IMPLANT
PACK ICE MAXI GEL EZY WRAP (MISCELLANEOUS) ×2 IMPLANT
PACK TOTAL KNEE CUSTOM (KITS) ×2 IMPLANT
PATELLA MEDIAL ATTUN 35MM KNEE (Knees) ×2 IMPLANT
PENCIL SMOKE EVACUATOR (MISCELLANEOUS) IMPLANT
PIN DRILL FIX HALF THREAD (BIT) ×2 IMPLANT
PIN STEINMAN FIXATION KNEE (PIN) ×2 IMPLANT
PROTECTOR NERVE ULNAR (MISCELLANEOUS) ×2 IMPLANT
SET HNDPC FAN SPRY TIP SCT (DISPOSABLE) ×1 IMPLANT
SUT ETHIBOND NAB CT1 #1 30IN (SUTURE) ×4 IMPLANT
SUT MNCRL AB 3-0 PS2 18 (SUTURE) ×2 IMPLANT
SUT VIC AB 0 CT1 36 (SUTURE) ×2 IMPLANT
SUT VIC AB 2-0 CT1 27 (SUTURE) ×4
SUT VIC AB 2-0 CT1 TAPERPNT 27 (SUTURE) ×2 IMPLANT
SYR CONTROL 10ML LL (SYRINGE) ×6 IMPLANT
TIBIAL BASE ROT PLAT SZ 5 KNEE (Knees) ×2 IMPLANT
TOWEL OR 17X26 10 PK STRL BLUE (TOWEL DISPOSABLE) ×2 IMPLANT
TRAY FOLEY MTR SLVR 16FR STAT (SET/KITS/TRAYS/PACK) ×2 IMPLANT
WATER STERILE IRR 1000ML POUR (IV SOLUTION) ×4 IMPLANT
WRAP KNEE MAXI GEL POST OP (GAUZE/BANDAGES/DRESSINGS) ×2 IMPLANT

## 2020-07-04 NOTE — Transfer of Care (Signed)
Immediate Anesthesia Transfer of Care Note  Patient: Anna Jacobs  Procedure(s) Performed: Procedure(s): TOTAL KNEE ARTHROPLASTY (Right)  Patient Location: PACU  Anesthesia Type:Spinal  Level of Consciousness:  sedated, patient cooperative and responds to stimulation  Airway & Oxygen Therapy:Patient Spontanous Breathing and Patient connected to face mask oxgen  Post-op Assessment:  Report given to PACU RN and Post -op Vital signs reviewed and stable  Post vital signs:  Reviewed and stable  Last Vitals:  Vitals:   07/04/20 0951 07/04/20 0956  BP: 129/70 (!) 151/73  Pulse: 66 72  Resp: 11 15  Temp:    SpO2: 163% 845%    Complications: No apparent anesthesia complications

## 2020-07-04 NOTE — Evaluation (Signed)
Physical Therapy Evaluation Patient Details Name: Anna Jacobs MRN: [id number] DOB: 05-Oct-1940 Today's Date: 07/04/2020   History of Present Illness  Pt is a 80 yo female s/p R TKA on 07/04/2020 with PMH significiant for anxiety, depression, hearing loss, parkinson's, peripheral neuropathy, urinary incontinence, legally blind, HTN, hyperlipidemia, and TIA.    Clinical Impression  Anna Jacobs is a 80 y.o. female POD 0 s/p Rt TKA. Patient reports modified independence with SPC or household mobility and assist from home aid or ADL's at baseline. Patient is now limited by functional impairments (see PT problem list below) and requires Min-Mod Assist for bed mobility and transfers with RW. Patient was able to take several small side steps with Mod +2 assist and RW to move Bed>Chair. Manual blocking/facilitation of Rt knee extension required and immobilizer applied at EOS for RN staff to help pt back to bed later today. Patient instructed in exercise to facilitate circulation and prevent DVT's. Patient will benefit from continued skilled PT interventions to address impairments and progress towards PLOF. Acute PT will follow to progress mobility and stair training in preparation for safe discharge. Recommending CIR follow up to maximize return to PLOF as pt is limited by pain, weakness, and history of Parkinson's.     Follow Up Recommendations CIR    Equipment Recommendations  Rolling walker with 5" wheels (youth)    Recommendations for Other Services Rehab consult;OT consult     Precautions / Restrictions Precautions Precautions: Knee Restrictions Weight Bearing Restrictions: No Other Position/Activity Restrictions: WBAT      Mobility  Bed Mobility Overal bed mobility: Needs Assistance Bed Mobility: Supine to Sit     Supine to sit: Min assist;HOB elevated     General bed mobility comments: cues for initiating LE movement to EOB and to reach Rt UE across to therapist for min  assist with raising trunk.    Transfers Overall transfer level: Needs assistance Equipment used: Rolling walker (2 wheeled) Transfers: Sit to/from [facility] Sit to Stand: Mod assist Stand pivot transfers: Mod assist;+2 physical assistance;+2 safety/equipment       General transfer comment: verbal/tactile cues for hand placement on RW, mod assist to initiate and complete rise to stand. pt unsteady due to Rt knee buckling and manual facilitation requried from PT for knee extension. Patient required Mod asisst +2 for balance and walker management to complete side steps and move bed>chair.  Ambulation/Gait                Stairs            Wheelchair Mobility    Modified Rankin (Stroke Patients Only)       Balance Overall balance assessment: Needs assistance Sitting-balance support: Feet supported;Bilateral upper extremity supported Sitting balance-Leahy Scale: Fair     Standing balance support: During functional activity;Bilateral upper extremity supported Standing balance-Leahy Scale: Poor Standing balance comment: pt reliant on external support of RW                             Pertinent Vitals/Pain Pain Assessment: Faces Faces Pain Scale: Hurts little more Pain Location: Rt knee Pain Descriptors / Indicators: Aching;Grimacing;Guarding;Discomfort Pain Intervention(s): Limited activity within patient's tolerance;Monitored during session;Repositioned    Home Living Family/patient expects to be discharged to:: Private residence Living Arrangements: Alone (Pt has home aide 8:30-4:30 M-F, and 12-4 Sat. Pt's daughter will be available to help until Monday and pt's other daughter an son  live nearby.) Available Help at Discharge: Family Type of Home: Assisted living Home Access: Stairs to enter Entrance Stairs-Rails: Right Entrance Stairs-Number of Steps: 1 Home Layout: One level Home Equipment: Walker - 4 wheels      Prior  Function Level of Independence: Needs assistance   Gait / Transfers Assistance Needed: pt has been using [place] per daughter for mobility and is limited to home ambulation 2/2 to legally blind.  ADL's / Homemaking Assistance Needed: pt has home aid toa ssist with ADL's        Hand Dominance        Extremity/Trunk Assessment   Upper Extremity Assessment Upper Extremity Assessment: Overall WFL for tasks assessed;Defer to OT evaluation    Lower Extremity Assessment Lower Extremity Assessment: RLE deficits/detail RLE Deficits / Details: pt with good quad activation and no extensor lag with SLR. pt Rt knee buckling in standing and cues needed to encourage use of UE's on RW to prevent. Manual facilitation from therapist at [facility] knee required to facilitate extension in stance. RLE Sensation: WNL RLE Coordination: decreased gross motor    Cervical / Trunk Assessment Cervical / Trunk Assessment: Kyphotic  Communication   Communication: HOH  Cognition Arousal/Alertness: Awake/alert Behavior During Therapy: WFL for tasks assessed/performed Overall Cognitive Status: Within Functional Limits for tasks assessed                                        General Comments      Exercises Total Joint Exercises Ankle Circles/Pumps: AROM;Both;20 reps;Seated   Assessment/Plan    PT Assessment Patient needs continued PT services  PT Problem List Decreased strength;Decreased range of motion;Decreased activity tolerance;Decreased balance;Decreased mobility;Decreased coordination;Decreased knowledge of use of DME;Decreased safety awareness;Decreased knowledge of precautions;Pain       PT Treatment Interventions Gait training;DME instruction;Stair training;Functional mobility training;Therapeutic activities;Therapeutic exercise;Balance training;Neuromuscular re-education;Patient/family education    PT Goals (Current goals can be found in the Care Plan section)  Acute Rehab PT  Goals Patient Stated Goal: recover and get home PT Goal Formulation: With patient Time For Goal Achievement: 07/11/20 Potential to Achieve Goals: Good    Frequency 7X/week   Barriers to discharge        Co-evaluation               AM-PAC PT "6 Clicks" Mobility  Outcome Measure Help needed turning from your back to your side while in a flat bed without using bedrails?: A Little Help needed moving from lying on your back to sitting on the side of a flat bed without using bedrails?: A Little Help needed moving to and from a bed to a chair (including a wheelchair)?: A Lot Help needed standing up from a chair using your arms (e.g., wheelchair or bedside chair)?: A Lot Help needed to walk in hospital room?: A Lot Help needed climbing 3-5 steps with a railing? : None 6 Click Score: 16    End of Session Equipment Utilized During Treatment: Gait belt Activity Tolerance: Patient tolerated treatment well Patient left: in chair;with call bell/phone within reach;with chair alarm set;with family/visitor present Nurse Communication: Mobility status PT Visit Diagnosis: Other abnormalities of gait and mobility (R26.89);Muscle weakness (generalized) (M62.81);Difficulty in walking, not elsewhere classified (R26.2);Pain Pain - Right/Left: Right Pain - part of body: Knee    Time: 9323-5573 PT Time Calculation (min) (ACUTE ONLY): 30 min   Charges:   PT Evaluation $  PT Eval Low Complexity: 1 Low PT Treatments $Therapeutic Activity: 8-22 mins        Verner Mould, DPT Acute Rehabilitation Services Office [phone] Pager [phone]    Jacques Navy 07/04/2020, 4:23 PM

## 2020-07-04 NOTE — Interval H&P Note (Signed)
History and Physical Interval Note:  07/04/2020 9:16 AM  Anna Jacobs  has presented today for surgery, with the diagnosis of OA RIGHT KNEE.  The various methods of treatment have been discussed with the patient and family. After consideration of risks, benefits and other options for treatment, the patient has consented to  Procedure(s): TOTAL KNEE ARTHROPLASTY (Right) as a surgical intervention.  The patient's history has been reviewed, patient examined, no change in status, stable for surgery.  I have reviewed the patient's chart and labs.  Questions were answered to the patient's satisfaction.     Yvette Rack

## 2020-07-04 NOTE — Anesthesia Preprocedure Evaluation (Signed)
Anesthesia Evaluation  Patient identified by MRN, date of birth, ID band Patient awake    Reviewed: Patient's Chart, lab work & pertinent test results  Airway Mallampati: II  TM Distance: >3 FB Neck ROM: Full    Dental  (+) Teeth Intact   Pulmonary sleep apnea , former smoker,    Pulmonary exam normal        Cardiovascular hypertension, Pt. on medications  Rhythm:Regular Rate:Normal     Neuro/Psych Anxiety Depression TIA Neuromuscular disease (Parkinson's dx 2001)    GI/Hepatic Neg liver ROS, GERD  ,  Endo/Other  negative endocrine ROS  Renal/GU negative Renal ROS Bladder dysfunction (urinary incontinence)      Musculoskeletal  (+) Arthritis , Osteoarthritis,    Abdominal (+)  Abdomen: soft. Bowel sounds: normal.  Peds  Hematology   Anesthesia Other Findings   Reproductive/Obstetrics                             Anesthesia Physical Anesthesia Plan  ASA: III  Anesthesia Plan: MAC, Spinal and Regional   Post-op Pain Management:  Regional for Post-op pain   Induction: Intravenous  PONV Risk Score and Plan: 2 and Ondansetron and Propofol infusion  Airway Management Planned: Nasal Cannula and Simple Face Mask  Additional Equipment: None  Intra-op Plan:   Post-operative Plan:   Informed Consent: I have reviewed the patients History and Physical, chart, labs and discussed the procedure including the risks, benefits and alternatives for the proposed anesthesia with the patient or authorized representative who has indicated his/her understanding and acceptance.     Dental advisory given  Plan Discussed with: CRNA  Anesthesia Plan Comments: (Lab Results      Component                Value               Date                      WBC                      5.4                 06/24/2020                HGB                      14.0                06/24/2020                HCT                       43.3                06/24/2020                MCV                      92.7                06/24/2020                PLT                      333  06/24/2020           Lab Results      Component                Value               Date                      NA                       141                 06/24/2020                K                        4.1                 06/24/2020                CO2                      30                  06/24/2020                GLUCOSE                  101 (H)             06/24/2020                BUN                      23                  06/24/2020                CREATININE               0.81                06/24/2020                CALCIUM                  9.5                 06/24/2020                GFRNONAA                 >60                 06/24/2020          )        Anesthesia Quick Evaluation

## 2020-07-04 NOTE — Anesthesia Procedure Notes (Signed)
Spinal  Patient location during procedure: OR Start time: 07/04/2020 10:33 AM End time: 07/04/2020 10:37 AM Staffing Performed: anesthesiologist  Anesthesiologist: Darral Dash, DO Preanesthetic Checklist Completed: patient identified, IV checked, site marked, risks and benefits discussed, surgical consent, monitors and equipment checked, pre-op evaluation and timeout performed Spinal Block Patient position: sitting Prep: DuraPrep Patient monitoring: heart rate, cardiac monitor, continuous pulse ox and blood pressure Approach: midline Location: L3-4 Injection technique: single-shot Needle Needle type: Pencan  Needle gauge: 24 G Needle length: 10 cm Assessment Sensory level: T6 Additional Notes Patient identified. Risks/Benefits/Options discussed with patient including but not limited to bleeding, infection, nerve damage, paralysis, failed block, incomplete pain control, headache, blood pressure changes, nausea, vomiting, reactions to medications, itching and postpartum back pain. Confirmed with bedside nurse the patient's most recent platelet count. Confirmed with patient that they are not currently taking any anticoagulation, have any bleeding history or any family history of bleeding disorders. Patient expressed understanding and wished to proceed. All questions were answered. Sterile technique was used throughout the entire procedure. Please see nursing notes for vital signs. Warning signs of high block given to the patient including shortness of breath, tingling/numbness in hands, complete motor block, or any concerning symptoms with instructions to call for help. Patient was given instructions on fall risk and not to get out of bed. All questions and concerns addressed with instructions to call with any issues or inadequate analgesia.

## 2020-07-04 NOTE — Op Note (Signed)
NAMEKENECIA, BARREN MEDICAL RECORD [id number] ACCOUNT [id number] DATE OF BIRTH:[birth year] FACILITY: [facility] LOCATION: [id number] PHYSICIAN:W. Mystie Ormand JR., MD  OPERATIVE REPORT  DATE OF PROCEDURE:  07/04/2020  PREOPERATIVE DIAGNOSIS:  Severe osteoarthritis, right knee, with varus deformity.  POSTOPERATIVE DIAGNOSIS:  Severe osteoarthritis, right knee, with varus deformity.  OPERATION:  Right total knee replacement (Attune DePuy cemented knee, size 5 femur and tibia, 35 mm all poly patella with 5 mm thick tibial bearing).  SURGEON:  Vangie Bicker, MD  ASSISTANTMarjo Bicker, PA  TOURNIQUET TIME:  55 minutes.  ANESTHESIA:  Spinal block with sedation.  DESCRIPTION OF PROCEDURE:  Supine position, inflation of thigh tourniquet after exsanguination of the leg to 350.  Straight skin incision with medial parapatellar approach to the knee made.  We did a 5-degree 10 mm valgus cut on the femur followed by  cutting 9 mm below the least diseased lateral compartment with the extension gap measured at 5 mm.  Femur was sized to be a size 5, placed all-in-1 cutting block appropriate degree of external rotation accomplishing the anterior, posterior, and chamfer  cuts.  Tibia was sized to be a size 5.  Posterior osteophytes were removed from the posterior aspect of the knee as well.  Complete release of the PCL.  A keel hole was cut for the tibial baseplate followed by box cut on the femur and resecting 9.5 mm  off the patella for a 35 mm all-poly trial.  All trials placed.  Resolution of the varus deformity noted.  Full extension, good balancing of the ligaments noted.  Final components were inserted tibia, followed by femur and patella.  Cement was allowed to  harden.  Excess cement was removed posterior aspect of the knee.  The trial bearing was removed.  Tourniquet was released under direct vision, no excessive bleeding was noted.  Final bearing was placed.  Small bleeders were coagulated.   Closure was  affected with #1 Ethibond, 2-0 Vicryl, and Monocryl in the skin.  Taken to recovery room in stable condition.  IN/NUANCE  D:07/04/2020 T:07/04/2020 JOB:[id number]

## 2020-07-04 NOTE — Discharge Instructions (Signed)

## 2020-07-04 NOTE — Progress Notes (Signed)
AssistedDr. Greg Stoltzfus with right, ultrasound guided, adductor canal block. Side rails up, monitors on throughout procedure. See vital signs in flow sheet. Tolerated Procedure well.  

## 2020-07-04 NOTE — Brief Op Note (Signed)
07/04/2020  12:42 PM  PATIENT:  Anna Jacobs  80 y.o. female  PRE-OPERATIVE DIAGNOSIS:  OA RIGHT KNEE  POST-OPERATIVE DIAGNOSIS:  OA RIGHT KNEE  PROCEDURE:  Procedure(s): TOTAL KNEE ARTHROPLASTY (Right)  SURGEON:  Surgeon(s) and Role:    * Earlie Server, MD - Primary  PHYSICIAN ASSISTANT: Chriss Czar, PA-C  ASSISTANTS: OR staff x1   ANESTHESIA:   local, regional, spinal and IV sedation  EBL:  50 mL   BLOOD ADMINISTERED:none  DRAINS: none   LOCAL MEDICATIONS USED:  MARCAINE     SPECIMEN:  No Specimen  DISPOSITION OF SPECIMEN:  N/A  COUNTS:  YES  TOURNIQUET:   Total Tourniquet Time Documented: Thigh (Right) - 56 minutes Total: Thigh (Right) - 56 minutes   DICTATION: .Other Dictation: Dictation Number unknown  PLAN OF CARE: Admit for overnight observation  PATIENT DISPOSITION:  PACU - hemodynamically stable.   Delay start of Pharmacological VTE agent (>24hrs) due to surgical blood loss or risk of bleeding: yes

## 2020-07-04 NOTE — Progress Notes (Signed)
Orthopedic Tech Progress Note Patient Details:  Anna Jacobs 1940/09/15 [id number] Bone foam left on bedside CPM Right Knee CPM Right Knee: On Right Knee Flexion (Degrees): 90 Right Knee Extension (Degrees): 0  Post Interventions Patient Tolerated: Well Instructions Provided: Care of device,Adjustment of device  Deen Deguia 07/04/2020, 2:12 PM

## 2020-07-04 NOTE — Plan of Care (Signed)
Discussed with patient and husband about plan of care for post-op day 0. ° ° °Will continue to monitor patient.  ° ° °SWhittemore, RN ° °

## 2020-07-04 NOTE — Plan of Care (Signed)
  Problem: Clinical Measurements: Goal: Ability to maintain clinical measurements within normal limits will improve Outcome: Progressing Goal: Will remain free from infection Outcome: Progressing Goal: Diagnostic test results will improve Outcome: Progressing Goal: Respiratory complications will improve Outcome: Progressing Goal: Cardiovascular complication will be avoided Outcome: Progressing   Problem: Nutrition: Goal: Adequate nutrition will be maintained Outcome: Progressing   Problem: Pain Managment: Goal: General experience of comfort will improve Outcome: Progressing   Problem: Skin Integrity: Goal: Risk for impaired skin integrity will decrease Outcome: Progressing   Problem: Education: Goal: Knowledge of the prescribed therapeutic regimen will improve Outcome: Progressing Goal: Individualized Educational Video(s) Outcome: Progressing   Problem: Activity: Goal: Ability to avoid complications of mobility impairment will improve Outcome: Progressing Goal: Range of joint motion will improve Outcome: Progressing   Problem: Clinical Measurements: Goal: Postoperative complications will be avoided or minimized Outcome: Progressing   Problem: Pain Management: Goal: Pain level will decrease with appropriate interventions Outcome: Progressing   Problem: Skin Integrity: Goal: Will show signs of wound healing Outcome: Progressing

## 2020-07-04 NOTE — Anesthesia Procedure Notes (Signed)
Anesthesia Regional Block: Adductor canal block   Pre-Anesthetic Checklist: ,, timeout performed, Correct Patient, Correct Site, Correct Laterality, Correct Procedure, Correct Position, site marked, Risks and benefits discussed,  Surgical consent,  Pre-op evaluation,  At surgeon's request and post-op pain management  Laterality: Right  Prep: Dura Prep       Needles:  Injection technique: Single-shot  Needle Type: Echogenic Stimulator Needle     Needle Length: 9cm  Needle Gauge: 20     Additional Needles:   Procedures:,,,, ultrasound used (permanent image in chart),,,,  Narrative:  Start time: 07/04/2020 9:50 AM End time: 07/04/2020 9:53 AM Injection made incrementally with aspirations every 5 mL.  Performed by: Personally  Anesthesiologist: Darral Dash, DO  Additional Notes: Patient identified. Risks/Benefits/Options discussed with patient including but not limited to bleeding, infection, nerve damage, failed block, incomplete pain control. Patient expressed understanding and wished to proceed. All questions were answered. Sterile technique was used throughout the entire procedure. Please see nursing notes for vital signs. Aspirated in 5cc intervals with injection for negative confirmation. Patient was given instructions on fall risk and not to get out of bed. All questions and concerns addressed with instructions to call with any issues or inadequate analgesia.

## 2020-07-04 NOTE — Anesthesia Postprocedure Evaluation (Signed)
Anesthesia Post Note  Patient: Anna Jacobs  Procedure(s) Performed: TOTAL KNEE ARTHROPLASTY (Right Knee)     Patient location during evaluation: PACU Anesthesia Type: Regional, MAC and Spinal Level of consciousness: oriented and awake and alert Pain management: pain level controlled Vital Signs Assessment: post-procedure vital signs reviewed and stable Respiratory status: spontaneous breathing, respiratory function stable and patient connected to nasal cannula oxygen Cardiovascular status: blood pressure returned to baseline and stable Postop Assessment: no headache, no backache and no apparent nausea or vomiting Anesthetic complications: no   No complications documented.  Last Vitals:  Vitals:   07/04/20 1330 07/04/20 1456  BP: 133/70 (!) 145/80  Pulse: 72 78  Resp: 19 16  Temp:    SpO2: 97% 96%    Last Pain:  Vitals:   07/04/20 1330  PainSc: 0-No pain                 Belenda Cruise P Tanisha Lutes

## 2020-07-05 DIAGNOSIS — M1711 Unilateral primary osteoarthritis, right knee: Secondary | ICD-10-CM | POA: Diagnosis not present

## 2020-07-05 DIAGNOSIS — E785 Hyperlipidemia, unspecified: Secondary | ICD-10-CM | POA: Diagnosis not present

## 2020-07-05 DIAGNOSIS — G20 Parkinson's disease: Secondary | ICD-10-CM | POA: Diagnosis not present

## 2020-07-05 DIAGNOSIS — I10 Essential (primary) hypertension: Secondary | ICD-10-CM | POA: Diagnosis not present

## 2020-07-05 DIAGNOSIS — M21161 Varus deformity, not elsewhere classified, right knee: Secondary | ICD-10-CM | POA: Diagnosis not present

## 2020-07-05 DIAGNOSIS — Z79899 Other long term (current) drug therapy: Secondary | ICD-10-CM | POA: Diagnosis not present

## 2020-07-05 LAB — URINE CULTURE: Culture: NO GROWTH

## 2020-07-05 LAB — ABO/RH: ABO/RH(D): O NEG

## 2020-07-05 NOTE — Progress Notes (Signed)
Physical Therapy Treatment Patient Details Name: Anna Jacobs MRN: [id number] DOB: 04/20/1941 Today's Date: 07/05/2020    History of Present Illness Pt is a 80 yo female s/p R TKA on 07/04/2020 with PMH significiant for anxiety, depression, hearing loss, parkinson's, peripheral neuropathy, urinary incontinence, legally blind, HTN, hyperlipidemia, and TIA.    PT Comments    Pt continues very motivated and progressing steadily with mobility.  Pt hopeful for dc home with dtr's assist tomorrow - weather dependent.   Follow Up Recommendations  Home health PT     Equipment Recommendations  Rolling walker with 5" wheels    Recommendations for Other Services       Precautions / Restrictions Precautions Precautions: Knee Restrictions Weight Bearing Restrictions: No Other Position/Activity Restrictions: WBAT    Mobility  Bed Mobility Overal bed mobility: Needs Assistance Bed Mobility: Sit to Supine       Sit to supine: Min assist   General bed mobility comments: increased time with cues for sequence and use of L LE to self assist  Transfers Overall transfer level: Needs assistance Equipment used: Rolling walker (2 wheeled) Transfers: Sit to/from Stand Sit to Stand: Min assist         General transfer comment: cues for LE management and use of UEs to self assist  Ambulation/Gait Ambulation/Gait assistance: Min assist Gait Distance (Feet): 80 Feet (and additional 15' back from bathroom) Assistive device: Rolling walker (2 wheeled) Gait Pattern/deviations: Step-to pattern;Decreased step length - right;Decreased step length - left;Shuffle;Trunk flexed Gait velocity: decr   General Gait Details: cues for sequence, posture and position from [employer]             Wheelchair Mobility    Modified Rankin (Stroke Patients Only)       Balance Overall balance assessment: Needs assistance Sitting-balance support: No upper extremity supported Sitting  balance-Leahy Scale: Fair     Standing balance support: Bilateral upper extremity supported;During functional activity Standing balance-Leahy Scale: Fair                              Cognition Arousal/Alertness: Awake/alert Behavior During Therapy: WFL for tasks assessed/performed Overall Cognitive Status: Within Functional Limits for tasks assessed                                        Exercises      General Comments        Pertinent Vitals/Pain Pain Assessment: 0-10 Pain Score: 5  Pain Location: Rt knee Pain Descriptors / Indicators: Aching Pain Intervention(s): Limited activity within patient's tolerance;Monitored during session;Premedicated before session (Pt taking Tyelenol only)    Home Living                      Prior Function            PT Goals (current goals can now be found in the care plan section) Acute Rehab PT Goals Patient Stated Goal: To go home PT Goal Formulation: With patient Time For Goal Achievement: 07/11/20 Potential to Achieve Goals: Good Progress towards PT goals: Progressing toward goals    Frequency    7X/week      PT Plan Discharge plan needs to be updated    Co-evaluation              AM-PAC PT "  6 Clicks" Mobility   Outcome Measure  Help needed turning from your back to your side while in a flat bed without using bedrails?: A Little Help needed moving from lying on your back to sitting on the side of a flat bed without using bedrails?: A Little Help needed moving to and from a bed to a chair (including a wheelchair)?: A Little Help needed standing up from a chair using your arms (e.g., wheelchair or bedside chair)?: A Little Help needed to walk in hospital room?: A Little Help needed climbing 3-5 steps with a railing? : A Lot 6 Click Score: 17    End of Session Equipment Utilized During Treatment: Gait belt Activity Tolerance: Patient tolerated treatment well Patient left: in  bed;with call bell/phone within reach;with family/visitor present Nurse Communication: Mobility status PT Visit Diagnosis: Other abnormalities of gait and mobility (R26.89);Muscle weakness (generalized) (M62.81);Difficulty in walking, not elsewhere classified (R26.2);Pain Pain - Right/Left: Right Pain - part of body: Knee     Time: 7353-2992 PT Time Calculation (min) (ACUTE ONLY): 42 min  Charges:  $Gait Training: 23-37 mins $Therapeutic Activity: 8-22 mins                     Debe Coder PT Acute Rehabilitation Services Pager [phone] Office [phone]    Cam Harnden 07/05/2020, 4:11 PM

## 2020-07-05 NOTE — Evaluation (Signed)
Occupational Therapy Evaluation Patient Details Name: Anna Jacobs MRN: [id number] DOB: 09-28-1940 Today's Date: 07/05/2020    History of Present Illness Pt is a 80 yo female s/p R TKA on 07/04/2020 with PMH significiant for anxiety, depression, hearing loss, parkinson's, peripheral neuropathy, urinary incontinence, legally blind, HTN, hyperlipidemia, and TIA.   Clinical Impression   Mrs. Anna Jacobs is a 80 year old woman s/p knee replacement surgery who presents with decreased ROM and strength of RLE and complaints of pain limiting patient's ability to perform baseline mobility and ADLs. Patient also presents with decreased activity tolerance and impaired balance. Patient is legally blind and is a barrier to progress. Patient min assist for ambulation with RW and requiring mod assist for LB ADLs and toileting. Patient will benefit from skilled OT services while in hospital to improve deficits and learn compensatory strategies as needed in order to return to PLOF. Patient does not nave 24/7 assistance at discharge and is at risk for falling and further injury. Therapist recommends short term rehab at discharge.     Follow Up Recommendations  Home health OT;SNF    Equipment Recommendations  3 in 1 bedside commode;Tub/shower seat    Recommendations for Other Services       Precautions / Restrictions Precautions Precautions: Knee Restrictions Weight Bearing Restrictions: No Other Position/Activity Restrictions: WBAT      Mobility Bed Mobility               General bed mobility comments: Patient up on [facility] when therapist entered room.    Transfers Overall transfer level: Needs assistance Equipment used: Rolling walker (2 wheeled) Transfers: Sit to/from [facility] Sit to Stand: Min assist;From elevated surface Stand pivot transfers: Min assist       General transfer comment: MIn assist to take steps to edge of bed.    Balance Overall balance  assessment: Needs assistance Sitting-balance support: No upper extremity supported Sitting balance-Leahy Scale: Fair     Standing balance support: Bilateral upper extremity supported;During functional activity Standing balance-Leahy Scale: Poor Standing balance comment: reliance on walker                           ADL either performed or assessed with clinical judgement   ADL Overall ADL's : Needs assistance/impaired Eating/Feeding: Set up;Sitting   Grooming: Set up;Sitting   Upper Body Bathing: Set up;Sitting   Lower Body Bathing: Moderate assistance;Sit to/from stand   Upper Body Dressing : Set up;Sitting   Lower Body Dressing: Moderate assistance;Sit to/from stand   Toilet Transfer: Minimal assistance;BSC;Stand-pivot;RW   Toileting- Clothing Manipulation and Hygiene: Moderate assistance Toileting - Clothing Manipulation Details (indicate cue type and reason): Patient able to wipe self and partially manage hospital gown with one hand - needing assistane for other side.             Vision Baseline Vision/History: Legally blind Additional Comments: Retinitis pigmentosa     Perception     Praxis      Pertinent Vitals/Pain Pain Assessment: Faces Faces Pain Scale: Hurts a little bit Pain Location: Rt knee Pain Descriptors / Indicators: Aching Pain Intervention(s): Limited activity within patient's tolerance;Premedicated before session;Repositioned     Hand Dominance Right   Extremity/Trunk Assessment Upper Extremity Assessment Upper Extremity Assessment: Overall WFL for tasks assessed   Lower Extremity Assessment Lower Extremity Assessment: Defer to PT evaluation       Communication Communication Communication: [facility]   Cognition Arousal/Alertness: Awake/alert  Behavior During Therapy: WFL for tasks assessed/performed Overall Cognitive Status: Within Functional Limits for tasks assessed                                      General Comments       Exercises     Shoulder Instructions      Home Living Family/patient expects to be discharged to:: Private residence Living Arrangements: Alone (Pt has home aide 8:30-4:30 M-F, and 12-4 Sat. Pt's daughter will be available to help until Monday and pt's other daughter an son live nearby.) Available Help at Discharge: Family Type of Home: Assisted living Home Access: Stairs to enter Entrance Stairs-Number of Steps: 1 Entrance Stairs-Rails: Right Home Layout: One level     Bathroom Shower/Tub: Teacher, early years/pre: Standard     Home Equipment: Environmental consultant - 4 wheels;Walker - 2 wheels          Prior Functioning/Environment Level of Independence: Needs assistance  Gait / Transfers Assistance Needed: pt has been using [place] per daughter for mobility and is limited to home ambulation 2/2 to legally blind. ADL's / Homemaking Assistance Needed: pt has home aid to a ssist with ADL's - 9-4 M-F and a brief period on Saturday. No assistance on Sunday.            OT Problem List: Decreased strength;Decreased range of motion;Decreased activity tolerance;Impaired balance (sitting and/or standing);Impaired vision/perception;Decreased knowledge of use of DME or AE;Obesity      OT Treatment/Interventions: Self-care/ADL training;DME and/or AE instruction;Therapeutic activities;Patient/family education;Balance training;Therapeutic exercise    OT Goals(Current goals can be found in the care plan section) Acute Rehab OT Goals Patient Stated Goal: To go home OT Goal Formulation: With patient Time For Goal Achievement: 07/19/20 Potential to Achieve Goals: Good  OT Frequency: Min 2X/week   Barriers to D/C: Decreased caregiver support          Co-evaluation              AM-PAC OT "6 Clicks" Daily Activity     Outcome Measure Help from another person eating meals?: A Little Help from another person taking care of personal grooming?: A Little Help  from another person toileting, which includes using toliet, bedpan, or urinal?: A Lot Help from another person bathing (including washing, rinsing, drying)?: A Lot Help from another person to put on and taking off regular upper body clothing?: A Little Help from another person to put on and taking off regular lower body clothing?: A Lot 6 Click Score: 15   End of Session Equipment Utilized During Treatment: Gait belt;Rolling walker Nurse Communication: Mobility status  Activity Tolerance: Patient tolerated treatment well Patient left: in bed;with call bell/phone within reach;Other (comment) (left in care of PT)  OT Visit Diagnosis: Other abnormalities of gait and mobility (R26.89);Unsteadiness on feet (R26.81);Low vision, both eyes (H54.2)                Time: 0254-2706 OT Time Calculation (min): 27 min Charges:  OT General Charges $OT Visit: 1 Visit OT Evaluation $OT Eval Moderate Complexity: 1 Mod  Domnick Chervenak, OTR/L [place]  Office [phone] Pager: [place] 07/05/2020, 11:34 AM

## 2020-07-05 NOTE — Progress Notes (Signed)
Inpatient Rehab Admissions Coordinator Note:   Per therapy recommendations, pt was screened for CIR candidacy by Clemens Catholic, [place] CCC-SLP. At this time, does not demonstrate need for CIR level therapies and payor source is unlikely to approve CIR stay. SNF may be a more appropriate venue for rehab if Pt. Unable to return home.  Please contact me with any questions.   Clemens Catholic, [place], [place] Admissions Coordinator  [phone] ([place]) [phone] (office)

## 2020-07-05 NOTE — Plan of Care (Signed)
  Problem: Clinical Measurements: Goal: Ability to maintain clinical measurements within normal limits will improve Outcome: Progressing Goal: Will remain free from infection Outcome: Progressing Goal: Diagnostic test results will improve Outcome: Progressing Goal: Respiratory complications will improve Outcome: Progressing Goal: Cardiovascular complication will be avoided Outcome: Progressing   Problem: Nutrition: Goal: Adequate nutrition will be maintained Outcome: Progressing   Problem: Pain Managment: Goal: General experience of comfort will improve Outcome: Progressing   Problem: Skin Integrity: Goal: Risk for impaired skin integrity will decrease Outcome: Progressing   Problem: Education: Goal: Knowledge of the prescribed therapeutic regimen will improve Outcome: Progressing Goal: Individualized Educational Video(s) Outcome: Progressing   Problem: Activity: Goal: Ability to avoid complications of mobility impairment will improve Outcome: Progressing Goal: Range of joint motion will improve Outcome: Progressing   Problem: Clinical Measurements: Goal: Postoperative complications will be avoided or minimized Outcome: Progressing   Problem: Pain Management: Goal: Pain level will decrease with appropriate interventions Outcome: Progressing   Problem: Skin Integrity: Goal: Will show signs of wound healing Outcome: Progressing   

## 2020-07-05 NOTE — Progress Notes (Addendum)
Received referral to assist with DME (youth RW). Per Anderson Malta, RN, pt plans to return home with 24 hr care. Discussed referral with Lucy, SW. This is an ortho bundle pt, the DME and HH has been arranged. Will f/u with [employer] for the youth walker. Referral was made to [facility] for [facility].

## 2020-07-05 NOTE — Progress Notes (Signed)
Physical Therapy Treatment Patient Details Name: Anna Jacobs MRN: [id number] DOB: [birth year] Today's Date: 07/05/2020    History of Present Illness Pt is a 80 yo female s/p R TKA on 07/04/2020 with PMH significiant for anxiety, depression, hearing loss, parkinson's, peripheral neuropathy, urinary incontinence, legally blind, HTN, hyperlipidemia, and TIA.    PT Comments    Pt very motivated and with improvement noted in activity tolerance, balance and level of assist required for all tasks.   Follow Up Recommendations  Home health PT;SNF (dependent on acute stay progress and level of assist available at home)     Equipment Recommendations  Rolling walker with 5" wheels (youth)    Recommendations for Other Services       Precautions / Restrictions Precautions Precautions: Knee Restrictions Weight Bearing Restrictions: No Other Position/Activity Restrictions: WBAT    Mobility  Bed Mobility               General bed mobility comments: Pt sitting EOB with OT on arrival  Transfers Overall transfer level: Needs assistance Equipment used: Rolling walker (2 wheeled) Transfers: Sit to/from [employer] Transfers Sit to Stand: Min assist;From elevated surface Stand pivot transfers: Min assist       General transfer comment: cues for LE management and use of UEs to self assist  Ambulation/Gait Ambulation/Gait assistance: Min assist Gait Distance (Feet): 42 Feet Assistive device: Rolling walker (2 wheeled) Gait Pattern/deviations: Step-to pattern;Decreased step length - right;Decreased step length - left;Shuffle;Trunk flexed Gait velocity: decr   General Gait Details: cues for sequence, posture and position from [employer]             Wheelchair Mobility    Modified Rankin (Stroke Patients Only)       Balance Overall balance assessment: Needs assistance Sitting-balance support: No upper extremity supported Sitting balance-Leahy Scale: Fair      Standing balance support: Bilateral upper extremity supported;During functional activity Standing balance-Leahy Scale: Poor Standing balance comment: reliance on walker                            Cognition Arousal/Alertness: Awake/alert Behavior During Therapy: WFL for tasks assessed/performed Overall Cognitive Status: Within Functional Limits for tasks assessed                                        Exercises Total Joint Exercises Ankle Circles/Pumps: AROM;Both;20 reps;Seated Quad Sets: AROM;Both;10 reps;Supine Heel Slides: AAROM;Right;15 reps;Supine Straight Leg Raises: AAROM;Right;10 reps;Supine Goniometric ROM: AAROM R knee -5 - 80    General Comments        Pertinent Vitals/Pain Pain Assessment: Faces Faces Pain Scale: Hurts a little bit Pain Location: Rt knee Pain Descriptors / Indicators: Aching Pain Intervention(s): Limited activity within patient's tolerance;Monitored during session;Premedicated before session;Ice applied    Home Living Family/patient expects to be discharged to:: Private residence Living Arrangements: Alone (Pt has home aide 8:30-4:30 M-F, and 12-4 Sat. Pt's daughter will be available to help until Monday and pt's other daughter an son live nearby.) Available Help at Discharge: Family Type of Home: Assisted living Home Access: Stairs to enter Entrance Stairs-Rails: Right Home Layout: One level Home Equipment: Environmental consultant - 4 wheels;Walker - 2 wheels      Prior Function Level of Independence: Needs assistance  Gait / Transfers Assistance Needed: pt has been using [place] per daughter for  mobility and is limited to home ambulation 2/2 to legally blind. ADL's / Homemaking Assistance Needed: pt has home aid to a ssist with ADL's - 9-4 M-F and a brief period on Saturday. No assistance on Sunday.     PT Goals (current goals can now be found in the care plan section) Acute Rehab PT Goals Patient Stated Goal: To go home PT  Goal Formulation: With patient Time For Goal Achievement: 07/11/20 Potential to Achieve Goals: Good Progress towards PT goals: Progressing toward goals    Frequency    7X/week      PT Plan Discharge plan needs to be updated    Co-evaluation              AM-PAC PT "6 Clicks" Mobility   Outcome Measure  Help needed turning from your back to your side while in a flat bed without using bedrails?: A Little Help needed moving from lying on your back to sitting on the side of a flat bed without using bedrails?: A Little Help needed moving to and from a bed to a chair (including a wheelchair)?: A Little Help needed standing up from a chair using your arms (e.g., wheelchair or bedside chair)?: A Little Help needed to walk in hospital room?: A Little Help needed climbing 3-5 steps with a railing? : A Lot 6 Click Score: 17    End of Session Equipment Utilized During Treatment: Gait belt Activity Tolerance: Patient tolerated treatment well Patient left: in chair;with call bell/phone within reach;with chair alarm set Nurse Communication: Mobility status PT Visit Diagnosis: Other abnormalities of gait and mobility (R26.89);Muscle weakness (generalized) (M62.81);Difficulty in walking, not elsewhere classified (R26.2);Pain Pain - Right/Left: Right Pain - part of body: Knee     Time: 0630-1601 PT Time Calculation (min) (ACUTE ONLY): 40 min  Charges:  $Gait Training: 8-22 mins $Therapeutic Exercise: 8-22 mins                     Debe Coder PT Acute Rehabilitation Services Pager [phone] Office [phone]    Termaine Roupp 07/05/2020, 12:55 PM

## 2020-07-05 NOTE — TOC Initial Note (Signed)
Transition of Care [facility]) - Initial/Assessment Note    Patient Details  Name: Anna Jacobs MRN: [id number] Date of Birth: Dec 11, 1940  [facility] CM/SW Contact:    Lennart Pall, LCSW Phone Number: 07/05/2020, 10:26 AM  Clinical Narrative:                 Met with pt to introduce self/ role and discuss dc plans.  Pt confirms that she lives alone, however, has a [place] aide M-F 9:30a - 4:30p and on Sat 1-4.  Family is not able to provide much support due to work demands.  Pt hopeful to be able to dc home but is aware of concerns of tx team that she be safe to be alone outside of the [facility] aide hours.  Will monitor therapy progress.    Expected Discharge Plan: [place] (vs. short term SNF) Barriers to Discharge: Continued Medical Work up   Patient Goals and CMS Choice        Expected Discharge Plan and Services Expected Discharge Plan: [place] (vs. short term SNF) In-house Referral: Clinical Social Work     Living arrangements for the past 2 months: [place] Expected Discharge Date: 07/05/20               DME Arranged: Gilford Rile rolling,CPM DME Agency: [employer]       HH Arranged: PT,OT [place] Agency: Other - See comment ([employer])        Prior Living Arrangements/Services Living arrangements for the past 2 months: Single Family Home Lives with:: Self Patient language and need for interpreter reviewed:: Yes Do you feel safe going back to the place where you live?: Yes      Need for Family Participation in Patient Care: Yes (Comment) Care giver support system in place?: Yes (comment) (partially) Current home services: Homehealth aide Criminal Activity/Legal Involvement Pertinent to Current Situation/Hospitalization: No - Comment as needed  Activities of Daily Living Home Assistive Devices/Equipment: Hearing aid,Cane (specify quad or straight),Walker (specify type),Blood pressure cuff,Grab bars in  shower,Hand-held shower hose ADL Screening (condition at time of admission) Patient's cognitive ability adequate to safely complete daily activities?: Yes Is the patient deaf or have difficulty hearing?: Yes Does the patient have difficulty seeing, even when wearing glasses/contacts?: Yes Does the patient have difficulty concentrating, remembering, or making decisions?: No Patient able to express need for assistance with ADLs?: Yes Does the patient have difficulty dressing or bathing?: No Independently performs ADLs?: Yes (appropriate for developmental age) Does the patient have difficulty walking or climbing stairs?: Yes Weakness of Legs: None Weakness of Arms/Hands: None  Permission Sought/Granted   Permission granted to share information with : Yes, Verbal Permission Granted  Share Information with NAME: Berline Lopes     Permission granted to share info w Relationship: daughter  Permission granted to share info w Contact Information: [phone]  Emotional Assessment Appearance:: Appears stated age Attitude/Demeanor/Rapport: Gracious Affect (typically observed): Accepting,Pleasant Orientation: : Oriented to Self,Oriented to Place,Oriented to  Time,Oriented to Situation Alcohol / Substance Use: Not Applicable Psych Involvement: No (comment)  Admission diagnosis:  Primary localized osteoarthritis of right knee [M17.11] Patient Active Problem List   Diagnosis Date Noted  . Primary localized osteoarthritis of right knee 07/04/2020  . Urge incontinence 01/29/2020  . Incomplete emptying of bladder 01/29/2020  . Lower abdominal pain 11/21/2012  . Abdominal bloating 11/21/2012  . Bowel habit changes 11/21/2012  . Fecal incontinence 11/21/2012  . IBS (  irritable bowel syndrome) 09/22/2010  . GERD (gastroesophageal reflux disease) 09/22/2010   PCP:  Monico Blitz, MD Pharmacy:   [facility] [address], [place] [address] [place] [address] Phone:  [phone] Fax: [place] [place], [address] [place], [place] [address] Phone: [phone] Fax: [phone]     Social Determinants of Health (SDOH) Interventions    Readmission Risk Interventions No flowsheet data found.

## 2020-07-05 NOTE — Progress Notes (Signed)
Orthopedic Tech Progress Note Patient Details:  Anna Jacobs 01-18-1941 [id number]  Ortho Devices Type of Ortho Device: Bone foam zero knee Ortho Device/Splint Location: on cpm   Post Interventions Patient Tolerated: Well Instructions Provided: Care of device,Adjustment of device   Braulio Bosch 07/05/2020, 4:25 PM

## 2020-07-05 NOTE — Progress Notes (Signed)
     Subjective: 1 Day Post-Op s/p Procedure(s): TOTAL KNEE ARTHROPLASTY  Overall doing well. States pain is mild to moderate. Denies chest pain, shortness of breath, nausea, vomiting, abdominal pain. States she has not yet voided since catheter was pulled. Concerned that CPM was used on incorrect leg yesterday.   Objective:  PE: VITALS:   Vitals:   07/04/20 2100 07/05/20 0136 07/05/20 0614 07/05/20 1031  BP: 123/73 (!) 123/54 121/64 (!) 122/59  Pulse: 91 65 63 69  Resp: 16 17 17 18   Temp: 98.6 F (37 C) 98.1 F (36.7 C) 97.9 F (36.6 C) 98.1 F (36.7 C)  TempSrc:    Oral  SpO2: 95% 94% 97% 99%  Weight:      Height:        ABD soft Neurovascular intact Sensation intact distally Intact pulses distally Dorsiflexion/Plantar flexion intact Incision: moderate drainage Compartment soft  LABS  No results found for this or any previous visit (from the past 24 hour(s)).  No results found.  Assessment/Plan:  Right knee osteoarthritis: 1 Day Post-Op s/p Procedure(s): TOTAL KNEE ARTHROPLASTY  Weightbearing: WBAT RLE, please make sure CPM is applied to RLE Insicional and dressing care: Reinforce dressings as needed, new aquacel placed today. Please make sure ted hose is on bilateral legs at discharge. VTE prophylaxis: Aspirin 81mg  BID x 30 days Pain control: continue current regimen Follow - up plan: with Dr. French Ana in 2 weeks.  Dispo:  Spoke with [employer] PA-C who states that both he and Dr. French Ana had multiple conversations with patient regarding safe discharge and were assured by patient and family that she would have 24/7 care in the immediate post operative period.   Spoke with patient's daughter victoria who states she and other daughter will be able to take care of her at night post operatively when home health aide is not there. Jordan Hawks will be at home with her until Tuesday and her sister can take over after that. Spoke with Yong Channel with PT who agrees  patient is not ready to go home today but will likely be able to go home tomorrow if patient continues to make progress with PT, weather permitting.   Contact information:   Weekdays 8-5 [address], [place] [phone] A fter hours and holidays please check [email or web address] for group call information for [facility]  Ventura Bruns 07/05/2020, 12:49 PM

## 2020-07-05 NOTE — Plan of Care (Signed)
  Problem: Nutrition: Goal: Adequate nutrition will be maintained Outcome: Progressing   Problem: Pain Managment: Goal: General experience of comfort will improve Outcome: Progressing   Problem: Skin Integrity: Goal: Risk for impaired skin integrity will decrease Outcome: Progressing   Problem: Activity: Goal: Ability to avoid complications of mobility impairment will improve Outcome: Progressing   Problem: Activity: Goal: Range of joint motion will improve Outcome: Progressing   Problem: Skin Integrity: Goal: Will show signs of wound healing Outcome: Progressing

## 2020-07-06 DIAGNOSIS — Z79899 Other long term (current) drug therapy: Secondary | ICD-10-CM | POA: Diagnosis not present

## 2020-07-06 DIAGNOSIS — M1711 Unilateral primary osteoarthritis, right knee: Secondary | ICD-10-CM | POA: Diagnosis not present

## 2020-07-06 DIAGNOSIS — G20 Parkinson's disease: Secondary | ICD-10-CM | POA: Diagnosis not present

## 2020-07-06 DIAGNOSIS — I10 Essential (primary) hypertension: Secondary | ICD-10-CM | POA: Diagnosis not present

## 2020-07-06 DIAGNOSIS — M21161 Varus deformity, not elsewhere classified, right knee: Secondary | ICD-10-CM | POA: Diagnosis not present

## 2020-07-06 DIAGNOSIS — E785 Hyperlipidemia, unspecified: Secondary | ICD-10-CM | POA: Diagnosis not present

## 2020-07-06 DIAGNOSIS — Z96651 Presence of right artificial knee joint: Secondary | ICD-10-CM

## 2020-07-06 MED ORDER — OXYCODONE HCL 5 MG PO TABS: 5.0000 mg | ORAL_TABLET | ORAL | Status: DC | PRN

## 2020-07-06 MED ORDER — OXYCODONE HCL 5 MG PO TABS
5.0000 mg | ORAL_TABLET | Freq: Four times a day (QID) | ORAL | Status: DC | PRN
  Administered 2020-07-07: 5 mg via ORAL
  Filled 2020-07-06 (×2): qty 1

## 2020-07-06 MED ORDER — TRAMADOL HCL 50 MG PO TABS: 50.0000 mg | ORAL_TABLET | Freq: Four times a day (QID) | ORAL | Status: DC | PRN

## 2020-07-06 MED ORDER — TRAMADOL HCL 50 MG PO TABS: 50.0000 mg | ORAL_TABLET | Freq: Four times a day (QID) | ORAL | Status: DC

## 2020-07-06 NOTE — Progress Notes (Addendum)
Blaine brown notified of pt syncopal episode earlier getting to bsc. Vs recorded in emr. Pt placed back in bed by nursing staff at 1540 due to pt getting sick to stomach and feeling very weak while on bsc. Pt was sweaty and pale with episode. After pt returning to bed she was placed with her head of bed down for circulation. Vs quickly improved after returning to bed. Pt now stable overall. Rn will continue to monitor.
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
## 2020-07-06 NOTE — Plan of Care (Signed)
  Problem: Pain Managment: Goal: General experience of comfort will improve Outcome: Progressing   Problem: Skin Integrity: Goal: Risk for impaired skin integrity will decrease Outcome: Progressing   Problem: Clinical Measurements: Goal: Respiratory complications will improve Outcome: Progressing   Problem: Clinical Measurements: Goal: Cardiovascular complication will be avoided Outcome: Progressing   Problem: Activity: Goal: Ability to avoid complications of mobility impairment will improve Outcome: Progressing

## 2020-07-06 NOTE — Progress Notes (Signed)
   07/06/20 1540  Assess: MEWS Score  BP (!) 74/53 (while on bsc,)  Resp 18  Level of Consciousness Alert  SpO2 97 %  O2 Device Room Air  Assess: MEWS Score  MEWS Temp 0  MEWS Systolic 2  MEWS Pulse 0  MEWS RR 0  MEWS LOC 0  MEWS Score 2  MEWS Score Color Yellow  Assess: if the MEWS score is Yellow or Red  Were vital signs taken at a resting state? No  Focused Assessment No change from prior assessment  Early Detection of Sepsis Score *See Row Information* Low  Treat  Pain Scale 0-10  Pain Score 0  Take Vital Signs  Increase Vital Sign Frequency  Yellow: Q 2hr X 2 then Q 4hr X 2, if remains yellow, continue Q 4hrs  Escalate  MEWS: Escalate Yellow: discuss with charge nurse/RN and consider discussing with provider and RRT  Notify: Charge Nurse/RN  Name of Charge Nurse/RN Notified jessie rn  Date Charge Nurse/RN Notified 07/06/20  Time Charge Nurse/RN Notified 73  Notify: Provider  Provider Name/Title blaine browne np  Date Provider Notified 07/06/20  Time Provider Notified 1617  Notification Type Call  Notification Reason Change in status  Response Other (Comment) (will place orders)  Date of Provider Response 07/06/20  Time of Provider Response [id number]  Document  Patient Outcome Stabilized after interventions  Progress note created (see row info) Yes

## 2020-07-06 NOTE — TOC Progression Note (Signed)
Transition of Care [facility]) - Progression Note    Patient Details  Name: Anna Jacobs MRN: [id number] Date of Birth: 06/15/1941  Transition of Care [facility]) CM/SW Contact  Joaquin Courts, RN Phone Number: 07/06/2020, 10:21 AM  Clinical Narrative:    CM delivered youth sized rolling walker to bedside.   Expected Discharge Plan: [place] (vs. short term SNF) Barriers to Discharge: Continued Medical Work up  Expected Discharge Plan and Services Expected Discharge Plan: [place] (vs. short term SNF) In-house Referral: Clinical Social Work     Living arrangements for the past 2 months: Single Family Home Expected Discharge Date: 07/06/20               DME Arranged: Gilford Rile rolling,CPM DME Agency: [employer]       HH Arranged: PT,OT [place] Agency: Other - See comment ([employer])         Social Determinants of Health (SDOH) Interventions    Readmission Risk Interventions No flowsheet data found.

## 2020-07-06 NOTE — Progress Notes (Signed)
Physical Therapy Treatment Patient Details Name: Anna Jacobs MRN: [id number] DOB: [birth year] Today's Date: 07/06/2020    History of Present Illness Pt is a 80 yo female s/p R TKA on 07/04/2020 with PMH significiant for anxiety, depression, hearing loss, parkinson's, peripheral neuropathy, urinary incontinence, legally blind, HTN, hyperlipidemia, and TIA.    PT Comments    Pt in good spirits and very cooperative but with increased fatigue this am.  Pt up to ambulate in halls with min guard and assist for direction 2* blindness.   Follow Up Recommendations  Home health PT     Equipment Recommendations  Rolling walker with 5" wheels    Recommendations for Other Services OT consult     Precautions / Restrictions Precautions Precautions: Knee Restrictions Weight Bearing Restrictions: No Other Position/Activity Restrictions: WBAT    Mobility  Bed Mobility Overal bed mobility: Needs Assistance Bed Mobility: Sit to Supine       Sit to supine: Mod assist;Max assist;+2 for physical assistance;+2 for safety/equipment   General bed mobility comments: Pt up to [facility] with nursing  Transfers Overall transfer level: Needs assistance Equipment used: Rolling walker (2 wheeled) Transfers: Sit to/from Stand Sit to Stand: Min guard Stand pivot transfers: Max assist       General transfer comment: cues for LE management and use of UEs to self assist  Ambulation/Gait Ambulation/Gait assistance: Min assist;Min guard Gait Distance (Feet): 58 Feet Assistive device: Rolling walker (2 wheeled) Gait Pattern/deviations: Step-to pattern;Decreased step length - right;Decreased step length - left;Shuffle;Trunk flexed Gait velocity: decr   General Gait Details: cues for posture, position from RW and for direction 2* blindness   Stairs             Wheelchair Mobility    Modified Rankin (Stroke Patients Only)       Balance Overall balance assessment: Needs  assistance Sitting-balance support: No upper extremity supported Sitting balance-Leahy Scale: Good     Standing balance support: Bilateral upper extremity supported;During functional activity Standing balance-Leahy Scale: Fair Standing balance comment: reliance on walker                            Cognition Arousal/Alertness: Awake/alert Behavior During Therapy: WFL for tasks assessed/performed Overall Cognitive Status: Within Functional Limits for tasks assessed                                 General Comments: Pt initially alert and following cues but became orthostatic after time on comode and with delayed responses and not following commands.      Exercises      General Comments        Pertinent Vitals/Pain Pain Assessment: 0-10 Pain Score: 5  Pain Location: R knee Pain Descriptors / Indicators: Aching;Sore Pain Intervention(s): Limited activity within patient's tolerance;Monitored during session;Premedicated before session;Ice applied    Home Living                      Prior Function            PT Goals (current goals can now be found in the care plan section) Acute Rehab PT Goals Patient Stated Goal: To go home PT Goal Formulation: With patient Time For Goal Achievement: 07/11/20 Potential to Achieve Goals: Good Progress towards PT goals: Progressing toward goals    Frequency    7X/week  PT Plan Current plan remains appropriate    Co-evaluation              AM-PAC PT "6 Clicks" Mobility   Outcome Measure  Help needed turning from your back to your side while in a flat bed without using bedrails?: A Little Help needed moving from lying on your back to sitting on the side of a flat bed without using bedrails?: A Little Help needed moving to and from a bed to a chair (including a wheelchair)?: A Little Help needed standing up from a chair using your arms (e.g., wheelchair or bedside chair)?: A  Little Help needed to walk in hospital room?: A Little Help needed climbing 3-5 steps with a railing? : A Lot 6 Click Score: 17    End of Session Equipment Utilized During Treatment: Gait belt Activity Tolerance: Patient tolerated treatment well;Patient limited by fatigue Patient left: in chair;with call bell/phone within reach;with chair alarm set Nurse Communication: Mobility status PT Visit Diagnosis: Difficulty in walking, not elsewhere classified (R26.2) Pain - Right/Left: Right Pain - part of body: Knee     Time: 4431-5400 PT Time Calculation (min) (ACUTE ONLY): 29 min  Charges:  $Gait Training: 23-37 mins $Therapeutic Activity: 23-37 mins                     [place] Pager [phone] Office [phone]    Rayona Sardinha 07/06/2020, 4:11 PM

## 2020-07-06 NOTE — Progress Notes (Signed)
Physical Therapy Treatment Patient Details Name: Anna Jacobs MRN: [id number] DOB: 1941/04/22 Today's Date: 07/06/2020    History of Present Illness Pt is a 80 yo female s/p R TKA on 07/04/2020 with PMH significiant for anxiety, depression, hearing loss, parkinson's, peripheral neuropathy, urinary incontinence, legally blind, HTN, hyperlipidemia, and TIA.    PT Comments    Pt cooperative as always and up from recliner to [facility] without incident.  Pt on beside commode 10+ minutes and stood for hygiene but returned to sitting with c/o nausea and with noted loss of color and increasingly delayed responses.  Pt leaned back and legs elevated on BSC to point of recovery in level of alertness and pt assisted stand/pvt to EOB and to supine - RN assisting and assessing pt upon return to supine.  Follow Up Recommendations  Home health PT     Equipment Recommendations  Rolling walker with 5" wheels    Recommendations for Other Services OT consult     Precautions / Restrictions Precautions Precautions: Knee Restrictions Weight Bearing Restrictions: No Other Position/Activity Restrictions: WBAT    Mobility  Bed Mobility Overal bed mobility: Needs Assistance Bed Mobility: Sit to Supine       Sit to supine: Mod assist;Max assist;+2 for physical assistance;+2 for safety/equipment   General bed mobility comments: Increased assist 2* low BP  Transfers Overall transfer level: Needs assistance Equipment used: Rolling walker (2 wheeled) Transfers: Sit to/from [facility] Sit to Stand: Min assist Stand pivot transfers: Max assist       General transfer comment: cues for LE management and use of UEs to self assist.  Pt up from recliner with min assist but max assist to bring up from [facility] and pvt to bedside 2* orthostatic  Ambulation/Gait Ambulation/Gait assistance: Min assist Gait Distance (Feet): 4 Feet Assistive device: Rolling walker (2 wheeled) Gait  Pattern/deviations: Step-to pattern;Decreased step length - right;Decreased step length - left;Shuffle;Trunk flexed Gait velocity: decr   General Gait Details: cues for sequence, posture and position from RW.  Distance ltd to recliner to [facility]   Stairs             Wheelchair Mobility    Modified Rankin (Stroke Patients Only)       Balance Overall balance assessment: Needs assistance Sitting-balance support: No upper extremity supported Sitting balance-Leahy Scale: Fair     Standing balance support: Bilateral upper extremity supported;During functional activity Standing balance-Leahy Scale: Fair Standing balance comment: reliance on walker                            Cognition Arousal/Alertness: Awake/alert Behavior During Therapy: WFL for tasks assessed/performed Overall Cognitive Status: Within Functional Limits for tasks assessed                                 General Comments: Pt initially alert and following cues but became orthostatic after time on comode and with delayed responses and not following commands.      Exercises      General Comments        Pertinent Vitals/Pain Pain Assessment: 0-10 Pain Score: 2  Pain Location: R knee Pain Descriptors / Indicators: Sore Pain Intervention(s): Limited activity within patient's tolerance;Monitored during session;Premedicated before session    Home Living  Prior Function            PT Goals (current goals can now be found in the care plan section) Acute Rehab PT Goals Patient Stated Goal: To go home PT Goal Formulation: With patient Time For Goal Achievement: 07/11/20 Potential to Achieve Goals: Good Progress towards PT goals: Not progressing toward goals - comment (orthostatic)    Frequency    7X/week      PT Plan Current plan remains appropriate    Co-evaluation              AM-PAC PT "6 Clicks" Mobility   Outcome Measure   Help needed turning from your back to your side while in a flat bed without using bedrails?: A Little Help needed moving from lying on your back to sitting on the side of a flat bed without using bedrails?: A Little Help needed moving to and from a bed to a chair (including a wheelchair)?: A Little Help needed standing up from a chair using your arms (e.g., wheelchair or bedside chair)?: A Little Help needed to walk in hospital room?: A Little Help needed climbing 3-5 steps with a railing? : A Lot 6 Click Score: 17    End of Session Equipment Utilized During Treatment: Gait belt Activity Tolerance: Other (comment) (orthostatic on BSC) Patient left: in bed;with call bell/phone within reach;with nursing/sitter in room Nurse Communication: Mobility status PT Visit Diagnosis: Difficulty in walking, not elsewhere classified (R26.2) Pain - Right/Left: Right Pain - part of body: Knee     Time: 3474-2595 PT Time Calculation (min) (ACUTE ONLY): 37 min  Charges:  $Therapeutic Activity: 23-37 mins                     [place] Pager [phone] Office [phone]    Inayah Woodin 07/06/2020, 4:03 PM

## 2020-07-06 NOTE — Progress Notes (Signed)
Physical Therapy Treatment Patient Details Name: Anna Jacobs MRN: [id number] DOB: 1940/10/14 Today's Date: 07/06/2020    History of Present Illness Pt is a 80 yo female s/p R TKA on 07/04/2020 with PMH significiant for anxiety, depression, hearing loss, parkinson's, peripheral neuropathy, urinary incontinence, legally blind, HTN, hyperlipidemia, and TIA.    PT Comments    Pt continues very cooperative and performed therex program with assist but with increased pain.  Pt requests break prior to attempting ambulation to recover from pain and to speak to dtr on the phone.  Will follow up shortly to attempt ambulation in hall.   Follow Up Recommendations  Home health PT     Equipment Recommendations  Rolling walker with 5" wheels    Recommendations for Other Services OT consult     Precautions / Restrictions Precautions Precautions: Knee Restrictions Weight Bearing Restrictions: No Other Position/Activity Restrictions: WBAT    Mobility  Bed Mobility                  Transfers                    Ambulation/Gait                 Stairs             Wheelchair Mobility    Modified Rankin (Stroke Patients Only)       Balance                                            Cognition Arousal/Alertness: Awake/alert Behavior During Therapy: WFL for tasks assessed/performed Overall Cognitive Status: Within Functional Limits for tasks assessed                                        Exercises Total Joint Exercises Ankle Circles/Pumps: AROM;Both;20 reps;Seated Quad Sets: AROM;Both;Supine;15 reps Heel Slides: AAROM;Right;15 reps;Supine Straight Leg Raises: AAROM;Right;Supine;20 reps Goniometric ROM: AAROM R knee -5 - 90    General Comments        Pertinent Vitals/Pain Pain Assessment: 0-10 Pain Score: 8  Pain Location: R knee with flexion Pain Descriptors / Indicators: Aching;Grimacing;Sore Pain  Intervention(s): Monitored during session;Limited activity within patient's tolerance;Premedicated before session    Home Living                      Prior Function            PT Goals (current goals can now be found in the care plan section) Acute Rehab PT Goals Patient Stated Goal: To go home PT Goal Formulation: With patient Time For Goal Achievement: 07/11/20 Potential to Achieve Goals: Good Progress towards PT goals: Progressing toward goals    Frequency    7X/week      PT Plan Current plan remains appropriate    Co-evaluation              AM-PAC PT "6 Clicks" Mobility   Outcome Measure  Help needed turning from your back to your side while in a flat bed without using bedrails?: A Little Help needed moving from lying on your back to sitting on the side of a flat bed without using bedrails?: A Little Help needed moving to and from a bed to a  chair (including a wheelchair)?: A Little Help needed standing up from a chair using your arms (e.g., wheelchair or bedside chair)?: A Little Help needed to walk in hospital room?: A Little Help needed climbing 3-5 steps with a railing? : A Lot 6 Click Score: 17    End of Session Equipment Utilized During Treatment: Gait belt Activity Tolerance: Patient tolerated treatment well Patient left: in bed;with call bell/phone within reach;with family/visitor present Nurse Communication: Mobility status PT Visit Diagnosis: Difficulty in walking, not elsewhere classified (R26.2) Pain - Right/Left: Right Pain - part of body: Knee     Time: 0945-1001 PT Time Calculation (min) (ACUTE ONLY): 16 min  Charges:  $Therapeutic Exercise: 8-22 mins                     [place] Pager [phone] Office [phone]    Paz Winsett 07/06/2020, 10:30 AM

## 2020-07-07 ENCOUNTER — Encounter (HOSPITAL_COMMUNITY): Payer: Self-pay | Admitting: Orthopedic Surgery

## 2020-07-07 DIAGNOSIS — I10 Essential (primary) hypertension: Secondary | ICD-10-CM | POA: Diagnosis not present

## 2020-07-07 DIAGNOSIS — M1711 Unilateral primary osteoarthritis, right knee: Secondary | ICD-10-CM | POA: Diagnosis not present

## 2020-07-07 DIAGNOSIS — G20 Parkinson's disease: Secondary | ICD-10-CM | POA: Diagnosis not present

## 2020-07-07 DIAGNOSIS — Z96651 Presence of right artificial knee joint: Secondary | ICD-10-CM | POA: Diagnosis not present

## 2020-07-07 DIAGNOSIS — Z79899 Other long term (current) drug therapy: Secondary | ICD-10-CM | POA: Diagnosis not present

## 2020-07-07 DIAGNOSIS — E785 Hyperlipidemia, unspecified: Secondary | ICD-10-CM | POA: Diagnosis not present

## 2020-07-07 DIAGNOSIS — M21161 Varus deformity, not elsewhere classified, right knee: Secondary | ICD-10-CM | POA: Diagnosis not present

## 2020-07-07 MED ORDER — TRAMADOL HCL 50 MG PO TABS: 50.0000 mg | ORAL_TABLET | Freq: Four times a day (QID) | ORAL | 0 refills | Status: AC | PRN

## 2020-07-07 NOTE — Progress Notes (Signed)
Occupational Therapy Treatment Patient Details Name: Anna Jacobs MRN: [id number] DOB: 09-10-1940 Today's Date: 07/07/2020    History of present illness 80 yo female s/p R TKA on 07/04/2020 with PMH significiant for anxiety, depression, hearing loss, parkinson's, peripheral neuropathy, urinary incontinence, legally blind, HTN, hyperlipidemia, and TIA.   OT comments  Pt progressing towards established OT goals and is motivated to dc to home. Providing education on compensatory techniques for dressing and pt donning clothing with Min A for managing R pant leg over foot. Providing education and handout for safe tub transfer and pt performing simulated transfer with Min A and RW. Daughter present throughout and very supportive. Continue to recommend dc to home with HHOT and answered all questions in preparation for dc later today.    Follow Up Recommendations  Home health OT    Equipment Recommendations  Tub/shower seat    Recommendations for Other Services      Precautions / Restrictions Precautions Precautions: Knee Precaution Comments: instructed no pillow under knee Restrictions Weight Bearing Restrictions: No Other Position/Activity Restrictions: WBAT       Mobility Bed Mobility Overal bed mobility: Needs Assistance Bed Mobility: Supine to Sit     Supine to sit: Supervision;Min guard     General bed mobility comments: In recliner upon arrival  Transfers Overall transfer level: Needs assistance Equipment used: Rolling walker (2 wheeled) Transfers: Sit to/from Stand Sit to Stand: Min guard Stand pivot transfers: Min guard       General transfer comment: MIn Guard A for safety. no phsyical A needed    Balance Overall balance assessment: Needs assistance Sitting-balance support: No upper extremity supported Sitting balance-Leahy Scale: Good     Standing balance support: Bilateral upper extremity supported;During functional activity Standing balance-Leahy  Scale: Fair Standing balance comment: reliance on walker                           ADL either performed or assessed with clinical judgement   ADL Overall ADL's : Needs assistance/impaired               Lower Body Bathing Details (indicate cue type and reason): Educating pt on compensatory techniques for Upper Body Dressing : Set up;Sitting Upper Body Dressing Details (indicate cue type and reason): Pt donning bra and shirt Lower Body Dressing: Minimal assistance;Sit to/from stand Lower Body Dressing Details (indicate cue type and reason): Min A for placing underwear R leg hole over R foot. Pt then able to navitgate and pull underwear up. Pt abel to donning pants with MIn Guard A.           Tub/Shower Transfer Details (indicate cue type and reason): Providing education on safe tub transfers with shower seat. Providing step by step verbal instructions. Pt then performing simulated trasnfer with Min A for support and tactile cues. Providing daughter with handout of transfer. Functional mobility during ADLs: Min guard;Rolling walker General ADL Comments: Pt performing dressing and simulated tub transfer. Answering all questions and reviewing all education     Vision       Perception     Praxis      Cognition Arousal/Alertness: Awake/alert Behavior During Therapy: WFL for tasks assessed/performed Overall Cognitive Status: Within Functional Limits for tasks assessed                                 General Comments: improved cognition  now no longer taking OXY but still slightly "foggy" requiring repeat VC's but mainly due to low vision        Exercises     Shoulder Instructions       General Comments Daughter present throughout session    Pertinent Vitals/ Pain       Pain Assessment: Faces Pain Score: 5  Faces Pain Scale: Hurts little more Pain Location: R knee Pain Descriptors / Indicators: Aching;Sore;Tightness Pain Intervention(s):  Monitored during session;Limited activity within patient's tolerance;Repositioned  Home Living                                          Prior Functioning/Environment              Frequency  Min 2X/week        Progress Toward Goals  OT Goals(current goals can now be found in the care plan section)  Progress towards OT goals: Progressing toward goals  Acute Rehab OT Goals Patient Stated Goal: To go home OT Goal Formulation: With patient Time For Goal Achievement: 07/19/20 Potential to Achieve Goals: Good ADL Goals Pt Will Perform Lower Body Dressing: with supervision;with adaptive equipment Pt Will Transfer to Toilet: with supervision;regular height toilet;ambulating;grab bars Pt Will Perform Toileting - Clothing Manipulation and hygiene: with supervision;sit to/from stand Additional ADL Goal #1: Patient will stand at sink to perform grooming task as evidence of improving activity tolerance  Plan Discharge plan remains appropriate    Co-evaluation                 AM-PAC OT "6 Clicks" Daily Activity     Outcome Measure   Help from another person eating meals?: A Little Help from another person taking care of personal grooming?: A Little Help from another person toileting, which includes using toliet, bedpan, or urinal?: A Lot Help from another person bathing (including washing, rinsing, drying)?: A Lot Help from another person to put on and taking off regular upper body clothing?: A Little Help from another person to put on and taking off regular lower body clothing?: A Lot 6 Click Score: 15    End of Session Equipment Utilized During Treatment: Rolling walker  OT Visit Diagnosis: Other abnormalities of gait and mobility (R26.89);Unsteadiness on feet (R26.81);Low vision, both eyes (H54.2)   Activity Tolerance Patient tolerated treatment well   Patient Left in chair;with call bell/phone within reach;with chair alarm set   Nurse  Communication Mobility status        Time: 1132-1205 OT Time Calculation (min): 33 min  Charges: OT General Charges $OT Visit: 1 Visit OT Treatments $Self Care/Home Management : 23-37 mins  [place], OTR/L Acute Rehab Pager: [phone] Office: [place] 07/07/2020, 1:17 PM

## 2020-07-07 NOTE — Progress Notes (Addendum)
     Subjective: 3 Days Post-Op s/p Procedure(s): TOTAL KNEE ARTHROPLASTY   States pain is well controlled this morning. Had orthostatic episode while on bedside commode yesterday after taking oxycodone. Denies chest pain, shortness of breath, nausea, vomiting. Voiding. No other complaints.   Objective:  PE: VITALS:   Vitals:   07/06/20 1757 07/06/20 1956 07/06/20 2344 07/07/20 0357  BP: 124/63 132/65 124/74 (!) 156/75  Pulse: 75 74 94 71  Resp: 20 20 15 16   Temp: 97.8 F (36.6 C) 98.7 F (37.1 C) 98.8 F (37.1 C) (!) 97.5 F (36.4 C)  TempSrc: Oral Oral Oral   SpO2: 96% 93% 94% 96%  Weight:      Height:        ABD soft Neurovascular intact Sensation intact distally Intact pulses distally Dorsiflexion/Plantar flexion intact Incision: no drainage  LABS  No results found for this or any previous visit (from the past 24 hour(s)).  No results found.  Assessment/Plan: Active Problems:   Primary localized osteoarthritis of right knee   S/P total knee arthroplasty, right  3 Days Post-Op s/p Procedure(s): TOTAL KNEE ARTHROPLASTY  Weightbearing:WBATRLE,  Insicional and dressing care:Reinforce dressings as needed.  VTE prophylaxis:Aspirin 81mg  BIDx 30 days Pain control:continue current regimen Follow - up plan:with Dr. French Ana in 2 weeks.  Dispo: Patient has 24/7 care at home, confirmed with family. Discharge home today.   Contact information:   Weekdays 8-5 [address], [place] [phone] A fter hours and holidays please check [email or web address] for group call information for [facility] [place] 07/07/2020, 9:50 AM

## 2020-07-07 NOTE — Discharge Summary (Addendum)
Discharge Summary  Patient ID: Anna Jacobs MRN: [id number] DOB/AGE: 09/15/1940 80 y.o.  Admit date: 07/04/2020 Discharge date: 07/07/2020  Admission Diagnoses:  Right knee osteoarthritis   Discharge Diagnoses:  Active Problems:   Primary localized osteoarthritis of right knee   S/P total knee arthroplasty, right   Past Medical History:  Diagnosis Date  . Allergy   . Anxiety   . B12 deficiency   . Degenerative joint disease   . Depression   . GERD (gastroesophageal reflux disease)    EGD 07/02/2003 Dr. Gala Romney  . Hearing loss   . Hyperlipemia   . Hypertension   . IBS (irritable bowel syndrome)    Colonoscopy normal Dr. Gala Romney 06/2003  . Insomnia   . Parkinson's disease 2001  . Peripheral neuropathy   . Restless leg syndrome   . RP (retinitis pigmentosa)   . Sleep apnea   . TIA (transient ischemic attack)   . Urinary incontinence     Surgeries: Procedure(s): TOTAL KNEE ARTHROPLASTY on 07/04/2020   Consultants (if any):   Discharged Condition: Improved  Hospital Course: Anna Jacobs is an 80 y.o. female who was admitted 07/04/2020 with a diagnosis of right knee osteoarthritis and went to the operating room on 07/04/2020 and underwent the above named procedures.    She was given perioperative antibiotics:  Anti-infectives (From admission, onward)   Start     Dose/Rate Route Frequency Ordered Stop   07/04/20 1630  ceFAZolin (ANCEF) IVPB 1 g/50 mL premix        1 g 100 mL/hr over 30 Minutes Intravenous Every 6 hours 07/04/20 1306 07/05/20 0346   07/04/20 0845  ceFAZolin (ANCEF) IVPB 2g/100 mL premix        2 g 200 mL/hr over 30 Minutes Intravenous On call to O.R. 07/04/20 8295 07/04/20 1030    .  She was given sequential compression devices, early ambulation, and aspirin for DVT prophylaxis.  She benefited maximally from the hospital stay and there were no complications.    Recent vital signs:  Vitals:   07/07/20 0357 07/07/20 1244  BP: (!) 156/75 125/75   Pulse: 71 85  Resp: 16 18  Temp: (!) 97.5 F (36.4 C) 98.5 F (36.9 C)  SpO2: 96% 96%    Recent laboratory studies:  Lab Results  Component Value Date   HGB 14.0 06/24/2020   HGB 13.6 09/22/2010   Lab Results  Component Value Date   WBC 5.4 06/24/2020   PLT 333 06/24/2020   Lab Results  Component Value Date   INR 1.1 06/24/2020   Lab Results  Component Value Date   NA 141 06/24/2020   K 4.1 06/24/2020   CL 104 06/24/2020   CO2 30 06/24/2020   BUN 23 06/24/2020   CREATININE 0.81 06/24/2020   GLUCOSE 101 (H) 06/24/2020    Discharge Medications:   Allergies as of 07/07/2020      Reactions   Flagyl [metronidazole Hcl] Itching, Rash      Medication List    STOP taking these medications   ibuprofen 200 MG tablet Commonly known as: ADVIL     TAKE these medications   acetaminophen 325 MG tablet Commonly known as: Tylenol Take 2 tablets (650 mg total) by mouth every 4 (four) hours as needed.   aspirin EC 81 MG tablet Take 1 tablet (81 mg total) by mouth 2 (two) times daily. TO PREVENT BLOOD CLOTS   atorvastatin 40 MG tablet Commonly known as: LIPITOR Take 40 mg  by mouth daily.   docusate sodium 100 MG capsule Commonly known as: Colace Take 1 capsule (100 mg total) by mouth daily as needed.   enalapril 5 MG tablet Commonly known as: VASOTEC Take 5 mg by mouth daily.   escitalopram 20 MG tablet Commonly known as: LEXAPRO Take 20 mg by mouth daily.   famotidine 20 MG tablet Commonly known as: Pepcid Take 1 tablet (20 mg total) by mouth daily. What changed: when to take this   lactose free nutrition Liqd Take 237 mLs by mouth 2 (two) times daily between meals. What changed:   when to take this  reasons to take this   Myrbetriq 50 MG Tb24 tablet Generic drug: mirabegron ER TAKE 1 TABLET BY MOUTH  DAILY What changed: how much to take   silodosin 8 MG Caps capsule Commonly known as: RAPAFLO Take 1 capsule (8 mg total) by mouth daily with  breakfast.   traMADol 50 MG tablet Commonly known as: Ultram Take 1 tablet (50 mg total) by mouth every 6 (six) hours as needed for moderate pain or severe pain.   traZODone 50 MG tablet Commonly known as: DESYREL Take 50 mg by mouth at bedtime.   vitamin B-12 500 MCG tablet Commonly known as: CYANOCOBALAMIN Take 500 mcg by mouth daily.   Vitamin D 50 MCG (2000 UT) tablet Take 2,000 Units by mouth daily.            Durable Medical Equipment  (From admission, onward)         Start     Ordered   07/05/20 1525  For home use only DME [employer]  Once       Question:  Patient needs a walker to treat with the following condition  Answer:  S/P knee replacement   07/05/20 1525   07/04/20 1456  DME Walker rolling  Once       Question Answer Comment  Walker: With 5 Inch Wheels   Patient needs a walker to treat with the following condition Primary localized osteoarthritis of right knee      07/04/20 1455          Diagnostic Studies: No results found.  Disposition: Discharge disposition: 06-Home-Health Care Svc          Follow-up Information    Earlie Server, MD. Go on 07/18/2020.   Specialty: Orthopedic Surgery Why: Your appointment is scheduled for 12:00  Contact information: [address] [place] [address] [phone]        [facility] Follow up.   Why: Therapy will see you at home for several visits prior to you going to the office for therapy.  Contact information: [address]  [place], [place] [address]  [phone]               Signed: [place] Bruns PA-C 07/07/2020, 1:21 PM

## 2020-07-07 NOTE — TOC Transition Note (Signed)
Transition of Care [facility]) - CM/SW Discharge Note   Patient Details  Name: Anna Jacobs MRN: [id number] Date of Birth: 14-Mar-1941  Transition of Care [facility] CM/SW Contact:  Lennart Pall, LCSW Phone Number: 07/07/2020, 10:31 AM   Clinical Narrative:    Met briefly with pt this morning.  Confirmed walker has been delivered to room.  [place] arranged prior to surgery.  No further TOC needs.   Final next level of care: [place] Barriers to Discharge: Barriers Resolved   Patient Goals and CMS Choice Patient states their goals for this hospitalization and ongoing recovery are:: go home      Discharge Placement                       Discharge Plan and Services In-house Referral: Clinical Social Work              DME Arranged: [employer],[facility] DME Agency: [employer]       HH Arranged: PT,OT [place] Agency: Other - See comment ([facility])        Social Determinants of Health (SDOH) Interventions     Readmission Risk Interventions No flowsheet data found.

## 2020-07-07 NOTE — Progress Notes (Signed)
Physical Therapy Treatment Patient Details Name: Anna Jacobs MRN: [id number] DOB: [birth year] Today's Date: 07/07/2020    History of Present Illness Pt is a 80 yo female s/p R TKA on 07/04/2020 with PMH significiant for anxiety, depression, hearing loss, parkinson's, peripheral neuropathy, urinary incontinence, legally blind, HTN, hyperlipidemia, and TIA.    PT Comments    POD # 3 am session General Comments: improved cognition now no longer taking OXY but still slightly "foggy" requiring repeat VC's but mainly due to low vision.  Assisted OOB to [facility].  General bed mobility comments: increased time and VC's to "slide" R LE bed pt was self able with much effort.  General transfer comment: with VC's for Low vision and equipment locvation (BSC) pt was able to self rise using hands to steady self and able to complete 1/4 turn from elevated bed to [facility].  With VC's on safety pt was self able to doff briefs as well as perform self peri care.  Slightly unsteady, instructed pt on tech. General Gait Details: pt required direction with walker due to low vision with VC's to "follow the walker". Pt tolerated a functional distance with no overt LOB. Practiced one step.  General stair comments: pt has one step to enter home.  Required assist for walker placement due to low vision and 25% VC's on proper sequencing.  Performed well. Then returned to room to perform some TE's following HEP handout.  Instructed on proper tech, freq as well as use of ICE.   Daughter Herbert Spires present during most of session.  Pt plans to D/C back home with hired help during day and daughters rotating at night. Addressed all mobility questions, discussed appropriate activity, educated on use of ICE.  Pt ready for D/C to home.    Follow Up Recommendations  Home health PT     Equipment Recommendations  Rolling walker with 5" wheels    Recommendations for Other Services       Precautions / Restrictions Precautions Precautions:  Knee Precaution Comments: instructed no pillow under knee Restrictions Weight Bearing Restrictions: No Other Position/Activity Restrictions: WBAT    Mobility  Bed Mobility Overal bed mobility: Needs Assistance Bed Mobility: Supine to Sit     Supine to sit: Supervision;Min guard     General bed mobility comments: increased time and VC's to "slide" R LE bed pt was self able with much effort  Transfers Overall transfer level: Needs assistance Equipment used: Rolling walker (2 wheeled) Transfers: Sit to/from Stand Sit to Stand: Supervision;Min guard Stand pivot transfers: Min guard       General transfer comment: with VC's for Low vision and equipment locvation (BSC) pt was able to self rise using hands to steady self and able to complete 1/4 turn from elevated bed to [facility].  With VC's on safety pt was self able to doff briefs as well as perform self peri care.  Slightly unsteady, instructed pt on tech.  Ambulation/Gait Ambulation/Gait assistance: Min guard;Min assist Gait Distance (Feet): 35 Feet Assistive device: Rolling walker (2 wheeled) Gait Pattern/deviations: Step-to pattern;Decreased step length - right;Decreased step length - left;Shuffle;Trunk flexed Gait velocity: decr   General Gait Details: pt required direction with walker due to low vision with VC's to "follow the walker". Pt tolerated a functional distance with no overt LOB.   Stairs Stairs: Yes Stairs assistance: Min assist   Number of Stairs: 1 General stair comments: pt has one step to enter home.  Required assist for walker placement due to  low vision and 25% VC's on proper sequencing.  Performed well.   Wheelchair Mobility    Modified Rankin (Stroke Patients Only)       Balance                                            Cognition Arousal/Alertness: Awake/alert Behavior During Therapy: WFL for tasks assessed/performed Overall Cognitive Status: Within Functional Limits for  tasks assessed                                 General Comments: improved cognition now no longer taking OXY but still slightly "foggy" requiring repeat VC's but mainly due to low vision      Exercises   Total Knee Replacement TE's following HEP handout 10 reps B LE ankle pumps 05 reps towel squeezes 05 reps knee presses  Educated on use of gait belt to assist with TE's Followed by ICE     General Comments        Pertinent Vitals/Pain Pain Assessment: 0-10 Pain Score: 5  Pain Location: R knee Pain Descriptors / Indicators: Aching;Sore;Tightness Pain Intervention(s): Monitored during session;Repositioned;Ice applied    Home Living                      Prior Function            PT Goals (current goals can now be found in the care plan section)      Frequency    7X/week      PT Plan Current plan remains appropriate    Co-evaluation              AM-PAC PT "6 Clicks" Mobility   Outcome Measure  Help needed turning from your back to your side while in a flat bed without using bedrails?: None Help needed moving from lying on your back to sitting on the side of a flat bed without using bedrails?: None Help needed moving to and from a bed to a chair (including a wheelchair)?: A Little Help needed standing up from a chair using your arms (e.g., wheelchair or bedside chair)?: A Little Help needed to walk in hospital room?: A Little Help needed climbing 3-5 steps with a railing? : A Little 6 Click Score: 20    End of Session Equipment Utilized During Treatment: Gait belt Activity Tolerance: Patient tolerated treatment well;Patient limited by fatigue Patient left: in chair;with call bell/phone within reach;with chair alarm set Nurse Communication: Mobility status (pt ready for D/C to home) PT Visit Diagnosis: Difficulty in walking, not elsewhere classified (R26.2) Pain - Right/Left: Right Pain - part of body: Knee     Time:  2979-8921 PT Time Calculation (min) (ACUTE ONLY): 42 min  Charges:  $Gait Training: 8-22 mins $Therapeutic Exercise: 8-22 mins $Therapeutic Activity: 8-22 mins                     Rica Koyanagi  PTA Acute  Rehabilitation Services Pager      [phone] Office      [phone]

## 2020-07-07 NOTE — Progress Notes (Signed)
Patient discharged to home w/ family. Given all belongings, instruction, equipment. Patient and dtr verbalized understanding of all instructions. Escorted to pov via w/c.

## 2020-07-08 DIAGNOSIS — R262 Difficulty in walking, not elsewhere classified: Secondary | ICD-10-CM | POA: Diagnosis not present

## 2020-07-08 DIAGNOSIS — M6281 Muscle weakness (generalized): Secondary | ICD-10-CM | POA: Diagnosis not present

## 2020-07-08 DIAGNOSIS — Z471 Aftercare following joint replacement surgery: Secondary | ICD-10-CM | POA: Diagnosis not present

## 2020-07-08 DIAGNOSIS — M1711 Unilateral primary osteoarthritis, right knee: Secondary | ICD-10-CM | POA: Diagnosis not present

## 2020-07-10 ENCOUNTER — Telehealth: Payer: Self-pay

## 2020-07-10 DIAGNOSIS — M6281 Muscle weakness (generalized): Secondary | ICD-10-CM | POA: Diagnosis not present

## 2020-07-10 DIAGNOSIS — R262 Difficulty in walking, not elsewhere classified: Secondary | ICD-10-CM | POA: Diagnosis not present

## 2020-07-10 DIAGNOSIS — M1711 Unilateral primary osteoarthritis, right knee: Secondary | ICD-10-CM | POA: Diagnosis not present

## 2020-07-10 DIAGNOSIS — Z471 Aftercare following joint replacement surgery: Secondary | ICD-10-CM | POA: Diagnosis not present

## 2020-07-10 NOTE — Telephone Encounter (Signed)
Returned message to pts caregiver. Drt had left message earlier. She wanted to know why pt was on Silodosin when she read it is rxed for men. I explained at times the med is given to women to help with their urination. Caregiver said she would let pt drt know.

## 2020-07-11 DIAGNOSIS — R262 Difficulty in walking, not elsewhere classified: Secondary | ICD-10-CM | POA: Diagnosis not present

## 2020-07-11 DIAGNOSIS — Z471 Aftercare following joint replacement surgery: Secondary | ICD-10-CM | POA: Diagnosis not present

## 2020-07-11 DIAGNOSIS — M6281 Muscle weakness (generalized): Secondary | ICD-10-CM | POA: Diagnosis not present

## 2020-07-11 DIAGNOSIS — M1711 Unilateral primary osteoarthritis, right knee: Secondary | ICD-10-CM | POA: Diagnosis not present

## 2020-07-14 DIAGNOSIS — Z471 Aftercare following joint replacement surgery: Secondary | ICD-10-CM | POA: Diagnosis not present

## 2020-07-14 DIAGNOSIS — M1711 Unilateral primary osteoarthritis, right knee: Secondary | ICD-10-CM | POA: Diagnosis not present

## 2020-07-14 DIAGNOSIS — M6281 Muscle weakness (generalized): Secondary | ICD-10-CM | POA: Diagnosis not present

## 2020-07-14 DIAGNOSIS — R262 Difficulty in walking, not elsewhere classified: Secondary | ICD-10-CM | POA: Diagnosis not present

## 2020-07-16 DIAGNOSIS — R262 Difficulty in walking, not elsewhere classified: Secondary | ICD-10-CM | POA: Diagnosis not present

## 2020-07-16 DIAGNOSIS — Z471 Aftercare following joint replacement surgery: Secondary | ICD-10-CM | POA: Diagnosis not present

## 2020-07-16 DIAGNOSIS — M6281 Muscle weakness (generalized): Secondary | ICD-10-CM | POA: Diagnosis not present

## 2020-07-16 DIAGNOSIS — M1711 Unilateral primary osteoarthritis, right knee: Secondary | ICD-10-CM | POA: Diagnosis not present

## 2020-07-17 DIAGNOSIS — R262 Difficulty in walking, not elsewhere classified: Secondary | ICD-10-CM | POA: Diagnosis not present

## 2020-07-17 DIAGNOSIS — Z471 Aftercare following joint replacement surgery: Secondary | ICD-10-CM | POA: Diagnosis not present

## 2020-07-17 DIAGNOSIS — M1711 Unilateral primary osteoarthritis, right knee: Secondary | ICD-10-CM | POA: Diagnosis not present

## 2020-07-17 DIAGNOSIS — M6281 Muscle weakness (generalized): Secondary | ICD-10-CM | POA: Diagnosis not present

## 2020-07-18 DIAGNOSIS — M1711 Unilateral primary osteoarthritis, right knee: Secondary | ICD-10-CM | POA: Diagnosis not present

## 2020-07-22 DIAGNOSIS — R262 Difficulty in walking, not elsewhere classified: Secondary | ICD-10-CM | POA: Diagnosis not present

## 2020-07-22 DIAGNOSIS — M6281 Muscle weakness (generalized): Secondary | ICD-10-CM | POA: Diagnosis not present

## 2020-07-22 DIAGNOSIS — Z471 Aftercare following joint replacement surgery: Secondary | ICD-10-CM | POA: Diagnosis not present

## 2020-07-22 DIAGNOSIS — M1711 Unilateral primary osteoarthritis, right knee: Secondary | ICD-10-CM | POA: Diagnosis not present

## 2020-07-24 DIAGNOSIS — M1711 Unilateral primary osteoarthritis, right knee: Secondary | ICD-10-CM | POA: Diagnosis not present

## 2020-07-24 DIAGNOSIS — M6281 Muscle weakness (generalized): Secondary | ICD-10-CM | POA: Diagnosis not present

## 2020-07-24 DIAGNOSIS — Z471 Aftercare following joint replacement surgery: Secondary | ICD-10-CM | POA: Diagnosis not present

## 2020-07-24 DIAGNOSIS — R262 Difficulty in walking, not elsewhere classified: Secondary | ICD-10-CM | POA: Diagnosis not present

## 2020-07-29 DIAGNOSIS — R262 Difficulty in walking, not elsewhere classified: Secondary | ICD-10-CM | POA: Diagnosis not present

## 2020-07-29 DIAGNOSIS — M1711 Unilateral primary osteoarthritis, right knee: Secondary | ICD-10-CM | POA: Diagnosis not present

## 2020-07-29 DIAGNOSIS — M6281 Muscle weakness (generalized): Secondary | ICD-10-CM | POA: Diagnosis not present

## 2020-07-29 DIAGNOSIS — Z471 Aftercare following joint replacement surgery: Secondary | ICD-10-CM | POA: Diagnosis not present

## 2020-08-05 DIAGNOSIS — R262 Difficulty in walking, not elsewhere classified: Secondary | ICD-10-CM | POA: Diagnosis not present

## 2020-08-05 DIAGNOSIS — M6281 Muscle weakness (generalized): Secondary | ICD-10-CM | POA: Diagnosis not present

## 2020-08-05 DIAGNOSIS — M1711 Unilateral primary osteoarthritis, right knee: Secondary | ICD-10-CM | POA: Diagnosis not present

## 2020-08-05 DIAGNOSIS — Z471 Aftercare following joint replacement surgery: Secondary | ICD-10-CM | POA: Diagnosis not present

## 2020-08-07 DIAGNOSIS — Z471 Aftercare following joint replacement surgery: Secondary | ICD-10-CM | POA: Diagnosis not present

## 2020-08-07 DIAGNOSIS — R262 Difficulty in walking, not elsewhere classified: Secondary | ICD-10-CM | POA: Diagnosis not present

## 2020-08-07 DIAGNOSIS — M6281 Muscle weakness (generalized): Secondary | ICD-10-CM | POA: Diagnosis not present

## 2020-08-07 DIAGNOSIS — M1711 Unilateral primary osteoarthritis, right knee: Secondary | ICD-10-CM | POA: Diagnosis not present

## 2020-08-12 DIAGNOSIS — M6281 Muscle weakness (generalized): Secondary | ICD-10-CM | POA: Diagnosis not present

## 2020-08-12 DIAGNOSIS — M1711 Unilateral primary osteoarthritis, right knee: Secondary | ICD-10-CM | POA: Diagnosis not present

## 2020-08-12 DIAGNOSIS — R262 Difficulty in walking, not elsewhere classified: Secondary | ICD-10-CM | POA: Diagnosis not present

## 2020-08-12 DIAGNOSIS — Z471 Aftercare following joint replacement surgery: Secondary | ICD-10-CM | POA: Diagnosis not present

## 2020-08-14 DIAGNOSIS — R262 Difficulty in walking, not elsewhere classified: Secondary | ICD-10-CM | POA: Diagnosis not present

## 2020-08-14 DIAGNOSIS — Z471 Aftercare following joint replacement surgery: Secondary | ICD-10-CM | POA: Diagnosis not present

## 2020-08-14 DIAGNOSIS — M1711 Unilateral primary osteoarthritis, right knee: Secondary | ICD-10-CM | POA: Diagnosis not present

## 2020-08-14 DIAGNOSIS — M6281 Muscle weakness (generalized): Secondary | ICD-10-CM | POA: Diagnosis not present

## 2020-08-18 DIAGNOSIS — E7800 Pure hypercholesterolemia, unspecified: Secondary | ICD-10-CM | POA: Diagnosis not present

## 2020-08-18 DIAGNOSIS — I10 Essential (primary) hypertension: Secondary | ICD-10-CM | POA: Diagnosis not present

## 2020-08-19 DIAGNOSIS — R262 Difficulty in walking, not elsewhere classified: Secondary | ICD-10-CM | POA: Diagnosis not present

## 2020-08-19 DIAGNOSIS — Z471 Aftercare following joint replacement surgery: Secondary | ICD-10-CM | POA: Diagnosis not present

## 2020-08-19 DIAGNOSIS — M6281 Muscle weakness (generalized): Secondary | ICD-10-CM | POA: Diagnosis not present

## 2020-08-19 DIAGNOSIS — M1711 Unilateral primary osteoarthritis, right knee: Secondary | ICD-10-CM | POA: Diagnosis not present

## 2020-08-21 DIAGNOSIS — Z471 Aftercare following joint replacement surgery: Secondary | ICD-10-CM | POA: Diagnosis not present

## 2020-08-21 DIAGNOSIS — M1711 Unilateral primary osteoarthritis, right knee: Secondary | ICD-10-CM | POA: Diagnosis not present

## 2020-08-21 DIAGNOSIS — R262 Difficulty in walking, not elsewhere classified: Secondary | ICD-10-CM | POA: Diagnosis not present

## 2020-08-21 DIAGNOSIS — M6281 Muscle weakness (generalized): Secondary | ICD-10-CM | POA: Diagnosis not present

## 2020-08-22 DIAGNOSIS — I10 Essential (primary) hypertension: Secondary | ICD-10-CM | POA: Diagnosis not present

## 2020-08-22 DIAGNOSIS — Z299 Encounter for prophylactic measures, unspecified: Secondary | ICD-10-CM | POA: Diagnosis not present

## 2020-08-22 DIAGNOSIS — G20 Parkinson's disease: Secondary | ICD-10-CM | POA: Diagnosis not present

## 2020-08-22 DIAGNOSIS — Z87891 Personal history of nicotine dependence: Secondary | ICD-10-CM | POA: Diagnosis not present

## 2020-09-11 DIAGNOSIS — H6123 Impacted cerumen, bilateral: Secondary | ICD-10-CM | POA: Diagnosis not present

## 2020-09-22 ENCOUNTER — Telehealth: Payer: Self-pay

## 2020-09-22 ENCOUNTER — Other Ambulatory Visit: Payer: Self-pay

## 2020-09-22 DIAGNOSIS — N3941 Urge incontinence: Secondary | ICD-10-CM

## 2020-09-22 MED ORDER — MIRABEGRON ER 50 MG PO TB24: 50.0000 mg | ORAL_TABLET | Freq: Every day | ORAL | 0 refills | Status: DC

## 2020-09-22 NOTE — Telephone Encounter (Signed)
Pt and caregiver called saying needed refill on Myrbetriq and has appointment at end of month. Refill for 30 tablets sent.

## 2020-10-10 DIAGNOSIS — G20 Parkinson's disease: Secondary | ICD-10-CM | POA: Diagnosis not present

## 2020-10-10 DIAGNOSIS — Z79899 Other long term (current) drug therapy: Secondary | ICD-10-CM | POA: Diagnosis not present

## 2020-10-10 DIAGNOSIS — Z299 Encounter for prophylactic measures, unspecified: Secondary | ICD-10-CM | POA: Diagnosis not present

## 2020-10-10 DIAGNOSIS — E7800 Pure hypercholesterolemia, unspecified: Secondary | ICD-10-CM | POA: Diagnosis not present

## 2020-10-10 DIAGNOSIS — I10 Essential (primary) hypertension: Secondary | ICD-10-CM | POA: Diagnosis not present

## 2020-10-10 DIAGNOSIS — R5383 Other fatigue: Secondary | ICD-10-CM | POA: Diagnosis not present

## 2020-10-15 ENCOUNTER — Other Ambulatory Visit: Payer: Self-pay

## 2020-10-15 ENCOUNTER — Telehealth (INDEPENDENT_AMBULATORY_CARE_PROVIDER_SITE_OTHER): Payer: Medicare Other | Admitting: Urology

## 2020-10-15 ENCOUNTER — Telehealth: Payer: Self-pay

## 2020-10-15 DIAGNOSIS — N3941 Urge incontinence: Secondary | ICD-10-CM

## 2020-10-15 DIAGNOSIS — R339 Retention of urine, unspecified: Secondary | ICD-10-CM | POA: Diagnosis not present

## 2020-10-15 MED ORDER — GEMTESA 75 MG PO TABS: 1.0000 | ORAL_TABLET | Freq: Every day | ORAL | 0 refills | Status: DC

## 2020-10-15 MED ORDER — SILODOSIN 8 MG PO CAPS: 8.0000 mg | ORAL_CAPSULE | Freq: Every day | ORAL | 11 refills | Status: DC

## 2020-10-15 MED ORDER — MIRABEGRON ER 50 MG PO TB24: 50.0000 mg | ORAL_TABLET | Freq: Every day | ORAL | 11 refills | Status: DC

## 2020-10-15 NOTE — Patient Instructions (Signed)
Overactive Bladder, Adult  Overactive bladder is a condition in which a person has a sudden and frequent need to urinate. A person might also leak urine if he or she cannot get to the bathroom fast enough (urinary incontinence). Sometimes, symptoms can interfere with work or social activities. What are the causes? Overactive bladder is associated with poor nerve signals between your bladder and your brain. Your bladder may get the signal to empty before it is full. You may also have very sensitive muscles that make your bladder squeeze too soon. This condition may also be caused by other factors, such as:  Medical conditions: ? Urinary tract infection. ? Infection of nearby tissues. ? Prostate enlargement. ? Bladder stones, inflammation, or tumors. ? Diabetes. ? Muscle or nerve weakness, especially from these conditions:  A spinal cord injury.  Stroke.  Multiple sclerosis.  Parkinson's disease.  Other causes: ? Surgery on the uterus or urethra. ? Drinking too much caffeine or alcohol. ? Certain medicines, especially those that eliminate extra fluid in the body (diuretics). ? Constipation. What increases the risk? You may be at greater risk for overactive bladder if you:  Are an older adult.  Smoke.  Are going through menopause.  Have prostate problems.  Have a neurological disease, such as stroke, dementia, Parkinson's disease, or multiple sclerosis (MS).  Eat or drink alcohol, spicy food, caffeine, and other things that irritate the bladder.  Are overweight or obese. What are the signs or symptoms? Symptoms of this condition include a sudden, strong urge to urinate. Other symptoms include:  Leaking urine.  Urinating 8 or more times a day.  Waking up to urinate 2 or more times overnight. How is this diagnosed? This condition may be diagnosed based on:  Your symptoms and medical history.  A physical exam.  Blood or urine tests to check for possible causes,  such as infection. You may also need to see a health care provider who specializes in urinary tract problems. This is called a urologist. How is this treated? Treatment for overactive bladder depends on the cause of your condition and whether it is mild or severe. Treatment may include:  Bladder training, such as: ? Learning to control the urge to urinate by following a schedule to urinate at regular intervals. ? Doing Kegel exercises to strengthen the pelvic floor muscles that support your bladder.  Special devices, such as: ? Biofeedback. This uses sensors to help you become aware of your body's signals. ? Electrical stimulation. This uses electrodes placed inside the body (implanted) or outside the body. These electrodes send gentle pulses of electricity to strengthen the nerves or muscles that control the bladder. ? Women may use a plastic device, called a pessary, that fits into the vagina and supports the bladder.  Medicines, such as: ? Antibiotics to treat bladder infection. ? Antispasmodics to stop the bladder from releasing urine at the wrong time. ? Tricyclic antidepressants to relax bladder muscles. ? Injections of botulinum toxin type A directly into the bladder tissue to relax bladder muscles.  Surgery, such as: ? A device may be implanted to help manage the nerve signals that control urination. ? An electrode may be implanted to stimulate electrical signals in the bladder. ? A procedure may be done to change the shape of the bladder. This is done only in very severe cases. Follow these instructions at home: Eating and drinking  Make diet or lifestyle changes recommended by your health care provider. These may include: ? Drinking fluids   throughout the day and not only with meals. ? Cutting down on caffeine or alcohol. ? Eating a healthy and balanced diet to prevent constipation. This may include:  Choosing foods that are high in fiber, such as beans, whole grains, and  fresh fruits and vegetables.  Limiting foods that are high in fat and processed sugars, such as fried and sweet foods.   Lifestyle  Lose weight if needed.  Do not use any products that contain nicotine or tobacco. These include cigarettes, chewing tobacco, and vaping devices, such as e-cigarettes. If you need help quitting, ask your health care provider.   General instructions  Take over-the-counter and prescription medicines only as told by your health care provider.  If you were prescribed an antibiotic medicine, take it as told by your health care provider. Do not stop taking the antibiotic even if you start to feel better.  Use any implants or pessary as told by your health care provider.  If needed, wear pads to absorb urine leakage.  Keep a log to track how much and when you drink, and when you need to urinate. This will help your health care provider monitor your condition.  Keep all follow-up visits. This is important. Contact a health care provider if:  You have a fever or chills.  Your symptoms do not get better with treatment.  Your pain and discomfort get worse.  You have more frequent urges to urinate. Get help right away if:  You are not able to control your bladder. Summary  Overactive bladder refers to a condition in which a person has a sudden and frequent need to urinate.  Several conditions may lead to an overactive bladder.  Treatment for overactive bladder depends on the cause and severity of your condition.  Making lifestyle changes, doing Kegel exercises, keeping a log, and taking medicines can help with this condition. This information is not intended to replace advice given to you by your health care provider. Make sure you discuss any questions you have with your health care provider. Document Revised: 02/25/2020 Document Reviewed: 02/25/2020 Elsevier Patient Education  2021 Elsevier Inc.  

## 2020-10-15 NOTE — Progress Notes (Signed)
10/15/2020 10:37 AM   Anna Jacobs 03/03/1941 [id number]  Referring provider: Monico Blitz, MD [address] [place],  [place] [address]   Patient location: home Physician location: office I connected with  [name] on 10/28/20 by a video enabled telemedicine application and verified that I am speaking with the correct person using two identifiers.   I discussed the limitations of evaluation and management by telemedicine. The patient expressed understanding and agreed to proceed.    Followup urge incontinence and incomplete emptying  HPI: [name] is a 80yo here for followup for OAB and incomplete emptying. She had knee replacement in January and has slow healing. She is on mirabegron 50mg  daily. She uses pads 2-3 per day. She is unhappy with her incontinence and she wants to try a different medication. Stream is fair with rapaflo 8mg  but it is better than her previous medications.   PMH: Past Medical History:  Diagnosis Date  . Allergy   . Anxiety   . B12 deficiency   . Degenerative joint disease   . Depression   . GERD (gastroesophageal reflux disease)    EGD 07/02/2003 Dr. Gala Romney  . Hearing loss   . Hyperlipemia   . Hypertension   . IBS (irritable bowel syndrome)    Colonoscopy normal Dr. Gala Romney 06/2003  . Insomnia   . Parkinson's disease 2001  . Peripheral neuropathy   . Restless leg syndrome   . RP (retinitis pigmentosa)   . Sleep apnea   . TIA (transient ischemic attack)   . Urinary incontinence     Surgical History: Past Surgical History:  Procedure Laterality Date  . Benign breast biopsy     left (remotely) and right (06/2012)  . BIOPSY N/A 11/29/2012   Procedure: BIOPSY;  Surgeon: Daneil Dolin, MD;  Location: [facility];  Service: Endoscopy;  Laterality: N/A;  RANDOM BIOPSY  . CHOLECYSTECTOMY  2000  . COLONOSCOPY N/A 11/29/2012   [facility]:ENIDPOEUMP rectal polyps-removed Otherwise normal examination distal terminal ileum appeared normal. Random colon  biopsies were negative for microscopic colitis. Rectum polyp, tubular adenoma. Next TCS 11/2019.  Marland Kitchen LEFT OOPHORECTOMY    . TOTAL ABDOMINAL HYSTERECTOMY    . TOTAL KNEE ARTHROPLASTY Right 07/04/2020   Procedure: TOTAL KNEE ARTHROPLASTY;  Surgeon: Earlie Server, MD;  Location: [facility];  Service: Orthopedics;  Laterality: Right;    Home Medications:  Allergies as of 10/15/2020      Reactions   Flagyl [metronidazole Hcl] Itching, Rash      Medication List       Accurate as of October 15, 2020 10:37 AM. If you have any questions, ask your nurse or doctor.        acetaminophen 325 MG tablet Commonly known as: Tylenol Take 2 tablets (650 mg total) by mouth every 4 (four) hours as needed.   atorvastatin 40 MG tablet Commonly known as: LIPITOR Take 40 mg by mouth daily.   docusate sodium 100 MG capsule Commonly known as: Colace Take 1 capsule (100 mg total) by mouth daily as needed.   enalapril 5 MG tablet Commonly known as: VASOTEC Take 5 mg by mouth daily.   escitalopram 20 MG tablet Commonly known as: LEXAPRO Take 20 mg by mouth daily.   famotidine 20 MG tablet Commonly known as: Pepcid Take 1 tablet (20 mg total) by mouth daily. What changed: when to take this   lactose free nutrition Liqd Take 237 mLs by mouth 2 (two) times daily between meals. What  changed:   when to take this  reasons to take this   mirabegron ER 50 MG Tb24 tablet Commonly known as: Myrbetriq Take 1 tablet (50 mg total) by mouth daily.   silodosin 8 MG Caps capsule Commonly known as: RAPAFLO Take 1 capsule (8 mg total) by mouth daily with breakfast.   traMADol 50 MG tablet Commonly known as: Ultram Take 1 tablet (50 mg total) by mouth every 6 (six) hours as needed for moderate pain or severe pain.   traZODone 50 MG tablet Commonly known as: DESYREL Take 50 mg by mouth at bedtime.   vitamin B-12 500 MCG tablet Commonly known as: CYANOCOBALAMIN Take 500 mcg by mouth daily.   Vitamin D  50 MCG (2000 UT) tablet Take 2,000 Units by mouth daily.       Allergies:  Allergies  Allergen Reactions  . Flagyl [Metronidazole Hcl] Itching and Rash    Family History: Family History  Problem Relation Age of Onset  . Coronary artery disease Father   . Lymphoma Mother   . Esophageal cancer Brother   . Lung cancer Brother   . Ovarian cancer Daughter 49  . Colon cancer Neg Hx   . Rectal cancer Neg Hx   . Stomach cancer Neg Hx     Social History:  reports that she has quit smoking. She has never used smokeless tobacco. She reports that she does not drink alcohol and does not use drugs.  ROS: All other review of systems were reviewed and are negative except what is noted above in HPI  Physical Exam: There were no vitals taken for this visit.  Constitutional:  Alert and oriented, No acute distress. HEENT: [place] AT, moist mucus membranes.  Trachea midline, no masses. Cardiovascular: No clubbing, cyanosis, or edema. Respiratory: Normal respiratory effort, no increased work of breathing. GI: Abdomen is soft, nontender, nondistended, no abdominal masses GU: No CVA tenderness.  Lymph: No cervical or inguinal lymphadenopathy. Skin: No rashes, bruises or suspicious lesions. Neurologic: Grossly intact, no focal deficits, moving all 4 extremities. Psychiatric: Normal mood and affect.  Laboratory Data: Lab Results  Component Value Date   WBC 5.4 06/24/2020   HGB 14.0 06/24/2020   HCT 43.3 06/24/2020   MCV 92.7 06/24/2020   PLT 333 06/24/2020    Lab Results  Component Value Date   CREATININE 0.81 06/24/2020    No results found for: PSA  No results found for: TESTOSTERONE  No results found for: HGBA1C  Urinalysis    Component Value Date/Time   APPEARANCEUR Clear 01/29/2020 1648   GLUCOSEU Negative 01/29/2020 1648   BILIRUBINUR Negative 01/29/2020 1648   PROTEINUR Negative 01/29/2020 1648   NITRITE Negative 01/29/2020 1648   LEUKOCYTESUR Trace (A) 01/29/2020 1648     Lab Results  Component Value Date   LABMICR See below: 01/29/2020   WBCUA 0-5 01/29/2020   LABEPIT 0-10 01/29/2020   BACTERIA Few (A) 01/29/2020    Pertinent Imaging:  No results found for this or any previous visit.  No results found for this or any previous visit.  No results found for this or any previous visit.  No results found for this or any previous visit.  No results found for this or any previous visit.  No results found for this or any previous visit.  No results found for this or any previous visit.  No results found for this or any previous visit.   Assessment & Plan:    1. Urge incontinence -We will  try Gemtesa 75mg  daily. Patient to call if she desires rx.  2. Incomplete emptying of bladder -rapaflo 8mg  daily   No follow-ups on file.  Nicolette Bang, MD  [facility]

## 2020-10-15 NOTE — Telephone Encounter (Signed)
Prior authorization submitted today for Gemtesa via covermymeds

## 2020-10-22 DIAGNOSIS — G20 Parkinson's disease: Secondary | ICD-10-CM | POA: Diagnosis not present

## 2020-10-22 DIAGNOSIS — G3184 Mild cognitive impairment, so stated: Secondary | ICD-10-CM | POA: Diagnosis not present

## 2020-10-22 DIAGNOSIS — H543 Unqualified visual loss, both eyes: Secondary | ICD-10-CM | POA: Diagnosis not present

## 2020-10-28 ENCOUNTER — Encounter: Payer: Self-pay | Admitting: Urology

## 2020-10-28 DIAGNOSIS — I10 Essential (primary) hypertension: Secondary | ICD-10-CM | POA: Diagnosis not present

## 2020-10-28 DIAGNOSIS — H548 Legal blindness, as defined in USA: Secondary | ICD-10-CM | POA: Diagnosis not present

## 2020-10-28 DIAGNOSIS — Z299 Encounter for prophylactic measures, unspecified: Secondary | ICD-10-CM | POA: Diagnosis not present

## 2020-10-28 DIAGNOSIS — G20 Parkinson's disease: Secondary | ICD-10-CM | POA: Diagnosis not present

## 2020-11-04 ENCOUNTER — Telehealth: Payer: Self-pay

## 2020-11-04 NOTE — Telephone Encounter (Signed)
Patient called today to let MD know she would not start taking Gemtesa until she comes back to see MD.

## 2020-11-14 DIAGNOSIS — M1711 Unilateral primary osteoarthritis, right knee: Secondary | ICD-10-CM | POA: Diagnosis not present

## 2020-11-28 DIAGNOSIS — Z789 Other specified health status: Secondary | ICD-10-CM | POA: Diagnosis not present

## 2020-11-28 DIAGNOSIS — H548 Legal blindness, as defined in USA: Secondary | ICD-10-CM | POA: Diagnosis not present

## 2020-11-28 DIAGNOSIS — I739 Peripheral vascular disease, unspecified: Secondary | ICD-10-CM | POA: Diagnosis not present

## 2020-11-28 DIAGNOSIS — I10 Essential (primary) hypertension: Secondary | ICD-10-CM | POA: Diagnosis not present

## 2020-11-28 DIAGNOSIS — Z299 Encounter for prophylactic measures, unspecified: Secondary | ICD-10-CM | POA: Diagnosis not present

## 2021-01-15 DIAGNOSIS — Z79899 Other long term (current) drug therapy: Secondary | ICD-10-CM | POA: Diagnosis not present

## 2021-01-15 DIAGNOSIS — I10 Essential (primary) hypertension: Secondary | ICD-10-CM | POA: Diagnosis not present

## 2021-01-15 DIAGNOSIS — E7800 Pure hypercholesterolemia, unspecified: Secondary | ICD-10-CM | POA: Diagnosis not present

## 2021-01-15 DIAGNOSIS — R5383 Other fatigue: Secondary | ICD-10-CM | POA: Diagnosis not present

## 2021-01-15 DIAGNOSIS — Z0000 Encounter for general adult medical examination without abnormal findings: Secondary | ICD-10-CM | POA: Diagnosis not present

## 2021-01-15 DIAGNOSIS — Z299 Encounter for prophylactic measures, unspecified: Secondary | ICD-10-CM | POA: Diagnosis not present

## 2021-01-15 DIAGNOSIS — E559 Vitamin D deficiency, unspecified: Secondary | ICD-10-CM | POA: Diagnosis not present

## 2021-01-15 DIAGNOSIS — Z7189 Other specified counseling: Secondary | ICD-10-CM | POA: Diagnosis not present

## 2021-02-11 DIAGNOSIS — H47293 Other optic atrophy, bilateral: Secondary | ICD-10-CM | POA: Diagnosis not present

## 2021-02-12 DIAGNOSIS — R110 Nausea: Secondary | ICD-10-CM | POA: Diagnosis not present

## 2021-02-12 DIAGNOSIS — Z299 Encounter for prophylactic measures, unspecified: Secondary | ICD-10-CM | POA: Diagnosis not present

## 2021-02-12 DIAGNOSIS — J069 Acute upper respiratory infection, unspecified: Secondary | ICD-10-CM | POA: Diagnosis not present

## 2021-02-19 DIAGNOSIS — M1711 Unilateral primary osteoarthritis, right knee: Secondary | ICD-10-CM | POA: Diagnosis not present

## 2021-02-19 DIAGNOSIS — Z96651 Presence of right artificial knee joint: Secondary | ICD-10-CM | POA: Diagnosis not present

## 2021-03-17 DIAGNOSIS — Z1231 Encounter for screening mammogram for malignant neoplasm of breast: Secondary | ICD-10-CM | POA: Diagnosis not present

## 2021-03-20 DIAGNOSIS — E7800 Pure hypercholesterolemia, unspecified: Secondary | ICD-10-CM | POA: Diagnosis not present

## 2021-03-20 DIAGNOSIS — I10 Essential (primary) hypertension: Secondary | ICD-10-CM | POA: Diagnosis not present

## 2021-03-25 DIAGNOSIS — M79672 Pain in left foot: Secondary | ICD-10-CM | POA: Diagnosis not present

## 2021-03-25 DIAGNOSIS — M79671 Pain in right foot: Secondary | ICD-10-CM | POA: Diagnosis not present

## 2021-03-25 DIAGNOSIS — M79675 Pain in left toe(s): Secondary | ICD-10-CM | POA: Diagnosis not present

## 2021-03-25 DIAGNOSIS — M79674 Pain in right toe(s): Secondary | ICD-10-CM | POA: Diagnosis not present

## 2021-03-25 DIAGNOSIS — L110 Acquired keratosis follicularis: Secondary | ICD-10-CM | POA: Diagnosis not present

## 2021-03-25 DIAGNOSIS — I739 Peripheral vascular disease, unspecified: Secondary | ICD-10-CM | POA: Diagnosis not present

## 2021-03-25 DIAGNOSIS — L89893 Pressure ulcer of other site, stage 3: Secondary | ICD-10-CM | POA: Diagnosis not present

## 2021-04-03 DIAGNOSIS — H905 Unspecified sensorineural hearing loss: Secondary | ICD-10-CM | POA: Diagnosis not present

## 2021-04-13 DIAGNOSIS — G4700 Insomnia, unspecified: Secondary | ICD-10-CM | POA: Diagnosis not present

## 2021-04-13 DIAGNOSIS — H543 Unqualified visual loss, both eyes: Secondary | ICD-10-CM | POA: Diagnosis not present

## 2021-04-13 DIAGNOSIS — G3184 Mild cognitive impairment, so stated: Secondary | ICD-10-CM | POA: Diagnosis not present

## 2021-04-13 DIAGNOSIS — G20 Parkinson's disease: Secondary | ICD-10-CM | POA: Diagnosis not present

## 2021-04-15 ENCOUNTER — Other Ambulatory Visit: Payer: Self-pay

## 2021-04-15 ENCOUNTER — Encounter: Payer: Self-pay | Admitting: Urology

## 2021-04-15 ENCOUNTER — Ambulatory Visit (INDEPENDENT_AMBULATORY_CARE_PROVIDER_SITE_OTHER): Payer: Medicare Other | Admitting: Urology

## 2021-04-15 VITALS — BP 133/81 | HR 76

## 2021-04-15 DIAGNOSIS — R339 Retention of urine, unspecified: Secondary | ICD-10-CM | POA: Diagnosis not present

## 2021-04-15 DIAGNOSIS — N3941 Urge incontinence: Secondary | ICD-10-CM

## 2021-04-15 LAB — BLADDER SCAN AMB NON-IMAGING: Scan Result: 45

## 2021-04-15 MED ORDER — TROSPIUM CHLORIDE ER 60 MG PO CP24: 1.0000 | ORAL_CAPSULE | Freq: Every day | ORAL | 11 refills | Status: DC

## 2021-04-15 MED ORDER — MIRABEGRON ER 50 MG PO TB24
50.0000 mg | ORAL_TABLET | Freq: Every day | ORAL | 11 refills | Status: DC
Start: 2021-04-15 — End: 2021-09-07

## 2021-04-15 MED ORDER — SILODOSIN 8 MG PO CAPS: 8.0000 mg | ORAL_CAPSULE | Freq: Every day | ORAL | 11 refills | Status: DC

## 2021-04-15 NOTE — Progress Notes (Signed)
post void residual=45 Patient unable to void Urological Symptom Review  Patient is experiencing the following symptoms: Get up at night to urinate Leakage of urine   Review of Systems  Gastrointestinal (upper)  : Indigestion/heartburn  Gastrointestinal (lower) : Negative for lower GI symptoms  Constitutional : Weight loss  Skin: Negative for skin symptoms  Eyes: Blurred vision Double vision  Ear/Nose/Throat : Negative for Ear/Nose/Throat symptoms  Hematologic/Lymphatic: Negative for Hematologic/Lymphatic symptoms  Cardiovascular : Negative for cardiovascular symptoms  Respiratory : Negative for respiratory symptoms  Endocrine: Negative for endocrine symptoms  Musculoskeletal: Negative for musculoskeletal symptoms  Neurological: Dizziness  Psychologic: Negative for psychiatric symptoms

## 2021-04-15 NOTE — Patient Instructions (Signed)

## 2021-04-15 NOTE — Progress Notes (Signed)
04/15/2021 11:49 AM   Anna Jacobs 17-May-1941 [id number]  Referring provider: Monico Blitz, MD [address] [place],  [place] [address]  Followup urge incontinence   HPI: Ms Anna Jacobs is a 80yo here for followup for incomplete bladder emptying and urge incontinence. She is on rapaflo 8mg  and mirabegron 50mg . Nocturia 0-1x. Urine stream strong. No straining to urinate. No urinary hesitancy. She uses 1-2 pads per day. She has rare incontinent episodes during the day. She occasional nocturnal enuresis.    PMH: Past Medical History:  Diagnosis Date   Allergy    Anxiety    B12 deficiency    Degenerative joint disease    Depression    GERD (gastroesophageal reflux disease)    EGD 07/02/2003 Dr. Gala Romney   Hearing loss    Hyperlipemia    Hypertension    IBS (irritable bowel syndrome)    Colonoscopy normal Dr. Gala Romney 06/2003   Insomnia    Parkinson's disease 2001   Peripheral neuropathy    Restless leg syndrome    RP (retinitis pigmentosa)    Sleep apnea    TIA (transient ischemic attack)    Urinary incontinence     Surgical History: Past Surgical History:  Procedure Laterality Date   Benign breast biopsy     left (remotely) and right (06/2012)   BIOPSY N/A 11/29/2012   Procedure: BIOPSY;  Surgeon: Daneil Dolin, MD;  [facility];  Service: Endoscopy;  Laterality: N/A;  RANDOM BIOPSY   CHOLECYSTECTOMY  2000   COLONOSCOPY N/A 11/29/2012   GEX:[id number] rectal polyps-removed Otherwise normal examination distal terminal ileum appeared normal. Random colon biopsies were negative for microscopic colitis. Rectum polyp, tubular adenoma. Next TCS 11/2019.   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 If you have any questions, ask your nurse or doctor.          acetaminophen 325 MG tablet Commonly known as: Tylenol Take 2 tablets (650 mg total) by mouth every 4 (four) hours as needed.   atorvastatin 40 MG tablet Commonly known as: LIPITOR Take 40 mg by mouth daily.   docusate sodium 100 MG capsule Commonly known as: Colace Take 1 capsule (100 mg total) by mouth daily as needed.   enalapril 5 MG tablet Commonly known as: VASOTEC Take 5 mg by mouth daily.   escitalopram 20 MG tablet Commonly known as: LEXAPRO Take 20 mg by mouth daily.   famotidine 20 MG tablet Commonly known as: Pepcid Take 1 tablet (20 mg total) by mouth daily. What changed: when to take this   Gemtesa 75 MG Tabs Generic drug: Vibegron Take 1 capsule by mouth daily.   lactose free nutrition Liqd Take 237 mLs by mouth 2 (two) times daily between meals. What changed:  when to take this reasons to take this   mirabegron ER 50 MG Tb24 tablet Commonly known as: Myrbetriq Take 1 tablet (50 mg total) by mouth daily.   silodosin 8 MG Caps capsule Commonly known as: RAPAFLO Take 1 capsule (8 mg total)  by mouth daily with breakfast.   traMADol 50 MG tablet Commonly known as: Ultram Take 1 tablet (50 mg total) by mouth every 6 (six) hours as needed for moderate pain or severe pain.   traZODone 50 MG tablet Commonly known as: DESYREL Take 50 mg by mouth at bedtime.   vitamin B-12 500 MCG tablet Commonly known as: CYANOCOBALAMIN Take 500 mcg by mouth daily.   Vitamin D 50 MCG (2000 UT) tablet Take 2,000 Units by mouth daily.        Allergies:  Allergies  Allergen Reactions   Flagyl [Metronidazole Hcl] Itching and Rash    Family History: Family History  Problem Relation Age of Onset   Coronary artery disease Father    Lymphoma Mother    Esophageal cancer Brother    Lung cancer Brother     Ovarian cancer Daughter 73   Colon cancer Neg Hx    Rectal cancer Neg Hx    Stomach cancer Neg Hx     Social History:  reports that she has quit smoking. She has never used smokeless tobacco. She reports that she does not drink alcohol and does not use drugs.  ROS: All other review of systems were reviewed and are negative except what is noted above in HPI  Physical Exam: BP 133/81   Pulse 76   Constitutional:  Alert and oriented, No acute distress. HEENT: [place] AT, moist mucus membranes.  Trachea midline, no masses. Cardiovascular: No clubbing, cyanosis, or edema. Respiratory: Normal respiratory effort, no increased work of breathing. GI: Abdomen is soft, nontender, nondistended, no abdominal masses GU: No CVA tenderness.  Lymph: No cervical or inguinal lymphadenopathy. Skin: No rashes, bruises or suspicious lesions. Neurologic: Grossly intact, no focal deficits, moving all 4 extremities. Psychiatric: Normal mood and affect.  Laboratory Data: Lab Results  Component Value Date   WBC 5.4 06/24/2020   HGB 14.0 06/24/2020   HCT 43.3 06/24/2020   MCV 92.7 06/24/2020   PLT 333 06/24/2020    Lab Results  Component Value Date   CREATININE 0.81 06/24/2020    No results found for: PSA  No results found for: TESTOSTERONE  No results found for: HGBA1C  Urinalysis    Component Value Date/Time   APPEARANCEUR Clear 01/29/2020 1648   GLUCOSEU Negative 01/29/2020 1648   BILIRUBINUR Negative 01/29/2020 1648   PROTEINUR Negative 01/29/2020 1648   NITRITE Negative 01/29/2020 1648   LEUKOCYTESUR Trace (A) 01/29/2020 1648    Lab Results  Component Value Date   LABMICR See below: 01/29/2020   WBCUA 0-5 01/29/2020   LABEPIT 0-10 01/29/2020   BACTERIA Few (A) 01/29/2020    Pertinent Imaging:  No results found for this or any previous visit.  No results found for this or any previous visit.  No results found for this or any previous visit.  No results found for this or  any previous visit.  No results found for this or any previous visit.  No results found for this or any previous visit.  No results found for this or any previous visit.  No results found for this or any previous visit.   Assessment & Plan:    1. Urge incontinence -continue mirabegron 50mg  daily sanctura 60mg  daily - Urinalysis, Routine w reflex microscopic  2. Incomplete emptying of bladder -contineu rapaflo 8mg  daily - BLADDER SCAN AMB NON-IMAGING     No follow-ups on file.  Nicolette Bang, MD  [facility] [place]

## 2021-04-16 DIAGNOSIS — M79675 Pain in left toe(s): Secondary | ICD-10-CM | POA: Diagnosis not present

## 2021-04-16 DIAGNOSIS — M79672 Pain in left foot: Secondary | ICD-10-CM | POA: Diagnosis not present

## 2021-04-16 DIAGNOSIS — L89893 Pressure ulcer of other site, stage 3: Secondary | ICD-10-CM | POA: Diagnosis not present

## 2021-04-16 DIAGNOSIS — I739 Peripheral vascular disease, unspecified: Secondary | ICD-10-CM | POA: Diagnosis not present

## 2021-04-16 DIAGNOSIS — L110 Acquired keratosis follicularis: Secondary | ICD-10-CM | POA: Diagnosis not present

## 2021-04-16 DIAGNOSIS — M79671 Pain in right foot: Secondary | ICD-10-CM | POA: Diagnosis not present

## 2021-04-16 DIAGNOSIS — M79674 Pain in right toe(s): Secondary | ICD-10-CM | POA: Diagnosis not present

## 2021-04-17 DIAGNOSIS — Z299 Encounter for prophylactic measures, unspecified: Secondary | ICD-10-CM | POA: Diagnosis not present

## 2021-04-17 DIAGNOSIS — M19079 Primary osteoarthritis, unspecified ankle and foot: Secondary | ICD-10-CM | POA: Diagnosis not present

## 2021-04-17 DIAGNOSIS — R300 Dysuria: Secondary | ICD-10-CM | POA: Diagnosis not present

## 2021-04-17 DIAGNOSIS — I10 Essential (primary) hypertension: Secondary | ICD-10-CM | POA: Diagnosis not present

## 2021-06-02 DIAGNOSIS — G20 Parkinson's disease: Secondary | ICD-10-CM | POA: Diagnosis not present

## 2021-06-02 DIAGNOSIS — G4700 Insomnia, unspecified: Secondary | ICD-10-CM | POA: Diagnosis not present

## 2021-06-02 DIAGNOSIS — H543 Unqualified visual loss, both eyes: Secondary | ICD-10-CM | POA: Diagnosis not present

## 2021-06-02 DIAGNOSIS — G3184 Mild cognitive impairment, so stated: Secondary | ICD-10-CM | POA: Diagnosis not present

## 2021-06-03 DIAGNOSIS — L110 Acquired keratosis follicularis: Secondary | ICD-10-CM | POA: Diagnosis not present

## 2021-06-03 DIAGNOSIS — M79672 Pain in left foot: Secondary | ICD-10-CM | POA: Diagnosis not present

## 2021-06-03 DIAGNOSIS — M79674 Pain in right toe(s): Secondary | ICD-10-CM | POA: Diagnosis not present

## 2021-06-03 DIAGNOSIS — L89893 Pressure ulcer of other site, stage 3: Secondary | ICD-10-CM | POA: Diagnosis not present

## 2021-06-03 DIAGNOSIS — I739 Peripheral vascular disease, unspecified: Secondary | ICD-10-CM | POA: Diagnosis not present

## 2021-06-03 DIAGNOSIS — M79675 Pain in left toe(s): Secondary | ICD-10-CM | POA: Diagnosis not present

## 2021-06-03 DIAGNOSIS — M79671 Pain in right foot: Secondary | ICD-10-CM | POA: Diagnosis not present

## 2021-08-17 ENCOUNTER — Encounter: Payer: Self-pay | Admitting: *Deleted

## 2021-08-18 ENCOUNTER — Encounter: Payer: Self-pay | Admitting: Neurology

## 2021-08-18 ENCOUNTER — Other Ambulatory Visit: Payer: Self-pay

## 2021-08-18 ENCOUNTER — Ambulatory Visit (INDEPENDENT_AMBULATORY_CARE_PROVIDER_SITE_OTHER): Payer: 59 | Admitting: Neurology

## 2021-08-18 VITALS — BP 116/76 | HR 81 | Ht 62.0 in | Wt 152.2 lb

## 2021-08-18 DIAGNOSIS — R419 Unspecified symptoms and signs involving cognitive functions and awareness: Secondary | ICD-10-CM

## 2021-08-18 DIAGNOSIS — R440 Auditory hallucinations: Secondary | ICD-10-CM | POA: Diagnosis not present

## 2021-08-18 DIAGNOSIS — R251 Tremor, unspecified: Secondary | ICD-10-CM | POA: Diagnosis not present

## 2021-08-18 DIAGNOSIS — R441 Visual hallucinations: Secondary | ICD-10-CM

## 2021-08-18 NOTE — Patient Instructions (Signed)
It was nice to meet you both today.  You have a rather mild tremor of both hands.  I do not see any signs or symptoms of parkinson's like disease or what we call parkinsonism.   For your tremor, I would not recommend any new medication for fear of side effects.   I do not have a good explanation for your hallucinations.  Please be mindful of that hallucinations and confusion may get worse with dehydration.  Certain medications can also cause hallucinations including bladder medication.  Please check with your urologist.  You have a history of depression, you are currently on Lexapro, sometimes an underlying mood disorder can be associated with hallucinations, I would recommend evaluation through a geriatric psychiatrist.  You can talk to your primary care about a referral locally to the psychiatrist.  Please remember, that any kind of tremor may be exacerbated by anxiety, anger, nervousness, excitement, dehydration, sleep deprivation, by caffeine, and low blood sugar values or blood sugar fluctuations, thyroid dysfunction. Some medications can exacerbate tremors, this includes antidepressant medications.  I would like for you to limit your caffeine intake to 1-2 servings per day, try to increase your water intake to about 6 cups/day if possible.   We will check your chemistry panel and thyroid screening test today.  So long as your kidney function is okay, I would like to proceed with a so-called DaT scan: This is a specialized brain scan designed to help with diagnosis of tremor disorders. A radioactive marker gets injected and the uptake is measured in the brain and compared to normal controls and right side is compared to the left, a change in uptake can help with diagnosis of certain tremor disorders. A brain MRI on the other hand is a brain scan that helps look at the brain structure in more detail overall and look for age-related changes, blood vessel related changes and look for stroke and volume  loss which we call atrophy.   We will call you with the results.

## 2021-08-18 NOTE — Progress Notes (Signed)
Subjective:    Patient ID: JAXON MYNHIER is a 81 y.o. female.  HPI    Star Age, MD, PhD [facility] [address], [address] [place], [place] [address]  Ms. 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  The patient is accompanied by her daughter today, her other daughter is on speaker phone.  Patient reports a history of Parkinson's disease, was diagnosed in 2005, symptoms started with bilateral hand tremors.  She denies a family history of tremors or parkinsonism or Parkinson's disease.  She has not had much in the way of progression of her tremors.  She has not tried any medication for this.  She has not had any recent falls.  She is legally blind, she lives alone, she has an aide that comes Monday through Friday, 9:30 AM through 4:30 PM.  At night, she is alone, she uses a cane or a walker especially at night.  She started having auditory hallucinations in August or September 2022.  She has vivid visual hallucinations that are disturbing to her.  She currently denies taking any medication for her memory, she is not sure if she ever tried the olanzapine, her daughter is not sure either.    She previously followed with Dr. Phillips Odor since approximately 2018 or 2019 and I was able to review prior extensive office notes from Dr. Merlene Laughter. She was last seen by Dr. Merlene Laughter on 06/02/2021.  I was able to review the office note.  She reported worsening hallucinations.  She had been on Nuplazid which she did not feel was beneficial.  She had previously been on quetiapine,  reportedly for over 1 year and had side effects.  She was started Nuplazid 34 mg daily in October 2022.  She was advised to stop the Nuplazid and start olanzapine in December 2022.  Of note, she also takes generic Lexapro 20 mg daily, mirtazapine 15 mg daily,, she is also on tramadol 50 mg every 6 hours as needed.  Full list of her medications as below.  Her Parkinson's disease was diagnosed in or around 2001.  For her Parkinson's disease she is currently on no symptomatic medication.   Her Past Medical History Is Significant For: Past Medical History:  Diagnosis Date   Allergy    Anxiety    B12 deficiency    Degenerative joint disease    Depression    GERD (gastroesophageal reflux disease)    EGD 07/02/2003 Dr. Gala Romney   Hearing loss    Hyperlipemia    Hypertension    IBS (irritable bowel syndrome)    Colonoscopy normal Dr. Gala Romney 06/2003   Insomnia    Parkinson's disease 2001   Peripheral neuropathy    Restless leg syndrome    RP (retinitis pigmentosa)    Sleep apnea    TIA (transient ischemic attack)    Urinary incontinence     Her Past Surgical History Is Significant For: Past Surgical History:  Procedure Laterality Date   Benign breast biopsy     left (remotely) and right (06/2012)   BIOPSY N/A 11/29/2012   Procedure: BIOPSY;  Surgeon: Daneil Dolin, MD;  [facility];  Service:  Endoscopy;  Laterality: N/A;  RANDOM BIOPSY   CHOLECYSTECTOMY  2000   COLONOSCOPY N/A 11/29/2012   XVQ:[id number] rectal polyps-removed Otherwise normal examination distal terminal ileum appeared normal. Random colon biopsies were negative for microscopic colitis. Rectum polyp, tubular adenoma. Next TCS 11/2019.   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   Procedure: TOTAL KNEE ARTHROPLASTY;  Surgeon: Earlie Server, MD;  Location: [facility];  Service: Orthopedics;  Laterality: Right;    Her Family History Is Significant For: Family  History  Problem Relation Age of Onset   Lymphoma Mother    Coronary artery disease Father    Esophageal cancer Brother    Lung cancer Brother    Ovarian cancer Daughter 59   Colon cancer Neg Hx    Rectal cancer Neg Hx    Stomach cancer Neg Hx    Parkinson's disease Neg Hx     Her Social History Is Significant For: Social History   Socioeconomic History   Marital status: Divorced    Spouse name: Not on file   Number of children: 3   Years of education: Not on file   Highest education level: Not on file  Occupational History   Occupation: disabled    Comment: Previously worked at [employer] Use   Smoking status: Former   Smokeless tobacco: Never  Scientific laboratory technician Use: Never used  Substance and Sexual Activity   Alcohol use: No   Drug use: No   Sexual activity: Not Currently  Other Topics Concern   Not on file  Social History Narrative   Not on file   Social Determinants of Health   Financial Resource Strain: Not on file  Food Insecurity: Not on file  Transportation Needs: Not on file  Physical Activity: Not on file  Stress: Not on file  Social Connections: Not on file    Her Allergies Are:  Allergies  Allergen Reactions   Flagyl [Metronidazole Hcl] Itching and Rash  :   Her Current Medications Are:  Outpatient Encounter Medications as of 08/18/2021  Medication Sig   atorvastatin (LIPITOR) 40 MG tablet Take 40 mg by mouth daily.   Cholecalciferol (VITAMIN D) 50 MCG (2000 UT) tablet Take 2,000 Units by mouth daily.   enalapril (VASOTEC) 5 MG tablet Take 5 mg by mouth daily.   escitalopram (LEXAPRO) 20 MG tablet Take 20 mg by mouth daily.   famotidine (PEPCID) 20 MG tablet Take 1 tablet (20 mg total) by mouth daily. (Patient taking differently: Take 20 mg by mouth 2 (two) times daily.)   lactose free nutrition (BOOST) LIQD Take 237 mLs by mouth 2 (two) times daily between meals. (Patient taking differently: Take 237 mLs by mouth daily as needed  (nutrition).)   mirabegron ER (MYRBETRIQ) 50 MG TB24 tablet Take 1 tablet (50 mg total) by mouth daily.   mirtazapine (REMERON) 30 MG tablet Take 30 mg by mouth at bedtime.   silodosin (RAPAFLO) 8 MG CAPS capsule Take 1 capsule (8 mg total) by mouth daily with breakfast.   traZODone (DESYREL) 50 MG tablet Take 50 mg by mouth at bedtime.   Trospium Chloride 60 MG CP24 Take 1 capsule (60 mg total) by mouth daily.   Vibegron (GEMTESA) 75 MG TABS Take 1 capsule by mouth daily.   vitamin B-12 (CYANOCOBALAMIN) 500 MCG tablet Take 500 mcg by mouth daily.   Facility-Administered Encounter Medications as of 08/18/2021  Medication  0.9 %  sodium chloride infusion  :   Review of Systems:  Out of a complete 14 point review of systems, all are reviewed and negative with the exception of these symptoms as listed below:   Review of Systems  Neurological:        Pt is here for parkinson's consult . Daughter states patient is having hallucinations.Pt states when she start taking trazodone the hallucinations starting . Patient is very emotional and crying .Pt is blind not able to do spiral test     Objective:  Neurological Exam  Physical Exam Physical Examination:   Vitals:   08/18/21 0811  BP: 116/76  Pulse: 81    General Examination: The patient is a very pleasant 80 y.o. female in no acute distress. She appears well-developed and well-nourished and well groomed.   HEENT: Normocephalic, atraumatic, pupils are minimally reactive to light, she has mild difficulty with eye movements, cannot track because of blindness.  She has bilateral hearing aids.  Hearing is mildly impaired.  Face is symmetric, no significant facial masking, no significant nuchal rigidity, no hypophonia or voice tremor, no dysarthria.  Airway examination reveals mild to moderate mouth dryness, tongue protrudes centrally and palate elevates symmetrically.  No carotid bruits.  No lip, neck or jaw tremor.  Chest: Clear to  auscultation without wheezing, rhonchi or crackles noted.  Heart: S1+S2+0, regular and normal without murmurs, rubs or gallops noted.   Abdomen: Soft, non-tender and non-distended.  Extremities: There is mild swelling in the right more than left distal lower extremities bilaterally.  She is status post right total knee replacement.  Skin: Warm and dry without trophic changes noted.   Musculoskeletal: exam reveals no obvious joint deformities.   Neurologically:  Mental status: The patient is awake, alert and oriented in all 4 spheres. Her immediate and remote memory, attention, language skills and fund of knowledge are mildly impaired.   Cranial nerves II - XII are as described above under HEENT exam.  Motor exam: Well, global strength 4+ out of 5.  She has a slight resting tremor in the left upper extremity, it is of low amplitude and faster frequency, she has a bilateral upper extremity mild postural tremor, slight action tremor, no intention tremor, no lower extremity tremor.  Romberg is not tested for safety concerns.  Fine motor skills are mildly impaired globally, no lateralization, no significant decrement in amplitude with finger taps or foot taps.   Cerebellar testing: No dysmetria or intention tremor. There is no truncal or gait ataxia.  Sensory exam: intact to light touch in the upper and lower extremities.  Gait, station and balance: She stands without difficulty, posture is fairly age-appropriate, she walks without a walking aid, walks slowly and cautiously, small steps, walks insecurity because of unknown environment and no walking stick, visually impaired.  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  She was diagnosed with Parkinson's disease in 2005 or thereabouts.  On her examination she does not have any telltale parkinsonian findings.  She has never been on any medication for symptomatic treatment of parkinsonism.  She has had hallucinations that started in the fall of last  year.  We talked about her presentation and my findings.  I am not convinced that she has parkinsonism or Parkinson's disease.  She may have developed hallucinations for a number of different reasons including a possible underlying mood disorder that can present with hallucinations associated.  In addition, certain medications can cause hallucinations, including her Myrbetriq.  She is advised to discuss this with her urologist.  She may benefit from seeing a geriatric psychiatrist as she does have residual depression.  As far as her tremor, she may have tremor secondary to anxiety, she does like to drink caffeine, she may have medication induced tremors including from her Lexapro.  I informed the patient and her daughters about tremor triggers.  She is advised to stay better hydrated, limit her caffeine intake, and follow-up with her primary care as well as consider seeing a geriatric psychiatrist.  I suggested we proceed with a DaTscan for further diagnostic help, it can help clarify tremor conditions.  We will check her kidney function and TSH today and plan for DaTscan.  We will call her with her DaTscan results and plan to follow-up as needed afterwards.  I answered all their questions today and the patient and her daughters were in agreement.   Star Age, MD, PhD

## 2021-08-19 LAB — COMPREHENSIVE METABOLIC PANEL
ALT: 17 IU/L (ref 0–32)
AST: 22 IU/L (ref 0–40)
Albumin/Globulin Ratio: 2 (ref 1.2–2.2)
Albumin: 4.5 g/dL (ref 3.7–4.7)
Alkaline Phosphatase: 78 IU/L (ref 44–121)
BUN/Creatinine Ratio: 31 — ABNORMAL HIGH (ref 12–28)
BUN: 31 mg/dL — ABNORMAL HIGH (ref 8–27)
Bilirubin Total: 0.9 mg/dL (ref 0.0–1.2)
CO2: 23 mmol/L (ref 20–29)
Calcium: 9.8 mg/dL (ref 8.7–10.3)
Chloride: 103 mmol/L (ref 96–106)
Creatinine, Ser: 1.01 mg/dL — ABNORMAL HIGH (ref 0.57–1.00)
Globulin, Total: 2.3 g/dL (ref 1.5–4.5)
Glucose: 86 mg/dL (ref 70–99)
Potassium: 4.5 mmol/L (ref 3.5–5.2)
Sodium: 140 mmol/L (ref 134–144)
Total Protein: 6.8 g/dL (ref 6.0–8.5)
eGFR: 57 mL/min/{1.73_m2} — ABNORMAL LOW (ref >59)

## 2021-08-19 LAB — TSH: TSH: 2.21 u[IU]/mL (ref 0.450–4.500)

## 2021-08-24 ENCOUNTER — Telehealth: Payer: Self-pay | Admitting: *Deleted

## 2021-08-24 NOTE — Telephone Encounter (Signed)
-----   Message from Star Age, MD sent at 08/19/2021  6:50 AM EST ----- ?Kidney function is good enough to proceed with a DaTscan if it is approved.  Nevertheless, numbers indicate that she may be less well-hydrated, please reinforce the importance of excellent hydration with water, encourage intake of at least 6 cups of water per day.  Please call patient or her daughter regarding this. ?

## 2021-08-24 NOTE — Telephone Encounter (Addendum)
Error

## 2021-08-24 NOTE — Telephone Encounter (Signed)
I also attempted to reach Anna Jacobs's daughter amy. No answer or vm. I was able to discuss with Anna Jacobs but she asked if daughter could be advised as well.  ?

## 2021-08-24 NOTE — Telephone Encounter (Signed)
Called patient not available to speak with per her CNA . Called Daughter Amy (Checked [place]) No Answer will try again later 2:44pm 08/24/2021  ?

## 2021-08-25 ENCOUNTER — Telehealth: Payer: Self-pay | Admitting: Neurology

## 2021-08-25 NOTE — Telephone Encounter (Signed)
[place]: [id number] (exp. 08/25/21 to 10/09/21) sent to Nuclear Medicine to be scheduled ?

## 2021-08-26 ENCOUNTER — Encounter: Payer: Self-pay | Admitting: *Deleted

## 2021-08-26 NOTE — Telephone Encounter (Signed)
Attempted to reach pt's daughter, Amy (on [place]). Rang multiple times. No VM available. Tried to reach the pt again but received a message saying VM wasn't activated.  ? ?Letter mailed to daughter asking for call back.  ?

## 2021-09-04 ENCOUNTER — Encounter (HOSPITAL_COMMUNITY): Admission: RE | Admit: 2021-09-04 | Payer: 59 | Source: Ambulatory Visit

## 2021-09-07 ENCOUNTER — Ambulatory Visit (INDEPENDENT_AMBULATORY_CARE_PROVIDER_SITE_OTHER): Payer: 59 | Admitting: Urology

## 2021-09-07 ENCOUNTER — Encounter: Payer: Self-pay | Admitting: Urology

## 2021-09-07 ENCOUNTER — Other Ambulatory Visit: Payer: Self-pay

## 2021-09-07 VITALS — BP 126/83 | HR 61

## 2021-09-07 DIAGNOSIS — R339 Retention of urine, unspecified: Secondary | ICD-10-CM | POA: Diagnosis not present

## 2021-09-07 DIAGNOSIS — N3941 Urge incontinence: Secondary | ICD-10-CM

## 2021-09-07 LAB — URINALYSIS, ROUTINE W REFLEX MICROSCOPIC
Bilirubin, UA: NEGATIVE
Glucose, UA: NEGATIVE
Nitrite, UA: NEGATIVE
Specific Gravity, UA: 1.02 (ref 1.005–1.030)
Urobilinogen, Ur: 0.2 mg/dL (ref 0.2–1.0)
pH, UA: 5.5 (ref 5.0–7.5)

## 2021-09-07 LAB — BLADDER SCAN AMB NON-IMAGING: Scan Result: 133

## 2021-09-07 MED ORDER — MIRABEGRON ER 50 MG PO TB24: 50.0000 mg | ORAL_TABLET | Freq: Every day | ORAL | 11 refills | Status: DC

## 2021-09-07 NOTE — Progress Notes (Signed)
? ?09/07/2021 ?10:29 AM  ? ?Carianne B Faddis ?11-01-1940 ?[id number] ? ?Referring provider: Monico Blitz, MD ?[address] ?[place],  [place] [address] ? ?Followup urge incontinence and incomplete emptying ? ? ?HPI: ?Ms Vandehei is a 81yo here here for followup for urge incontinence and incomplete emptying. PVR 133cc. She remains on mirabegron '50mg'$  daily. She stopped rapaflo and trospium. Her incontinence has improved to 0-1 pads daily. Nocturia 0x. Urine stream strong. No other complaints today.  ? ? ?PMH: ?Past Medical History:  ?Diagnosis Date  ? Allergy   ? Anxiety   ? B12 deficiency   ? Degenerative joint disease   ? Depression   ? GERD (gastroesophageal reflux disease)   ? EGD 07/02/2003 Dr. Gala Romney  ? Hearing loss   ? Hyperlipemia   ? Hypertension   ? IBS (irritable bowel syndrome)   ? Colonoscopy normal Dr. Gala Romney 06/2003  ? Insomnia   ? Parkinson's disease 2001  ? Peripheral neuropathy   ? Restless leg syndrome   ? RP (retinitis pigmentosa)   ? Sleep apnea   ? TIA (transient ischemic attack)   ? Urinary incontinence   ? ? ?Surgical History: ?Past Surgical History:  ?Procedure Laterality Date  ? Benign breast biopsy    ? left (remotely) and right (06/2012)  ? BIOPSY N/A 11/29/2012  ? Procedure: BIOPSY;  Surgeon: Daneil Dolin, MD;  [facility];  Service: Endoscopy;  Laterality: N/A;  RANDOM BIOPSY  ? CHOLECYSTECTOMY  2000  ? COLONOSCOPY N/A 11/29/2012  ? [id number] rectal polyps-removed Otherwise normal examination distal terminal ileum appeared normal. Random colon biopsies were negative for microscopic colitis. Rectum polyp, tubular adenoma. Next TCS 11/2019.  ? LEFT OOPHORECTOMY    ? TOTAL ABDOMINAL HYSTERECTOMY    ? 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 Procedure: TOTAL KNEE ARTHROPLASTY;  Surgeon: Earlie Server, MD;  [facility]: [facility];  Service: Orthopedics;  Laterality: Right;  ? ? ?Home Medications:  ?Allergies as of 09/07/2021   ? ?   Reactions  ? Flagyl [metronidazole Hcl] Itching, Rash  ? ?  ? ?   ?Medication List  ?  ? ?  ? Accurate as of September 07, 2021 10:29 AM. If you have any questions, ask your nurse or doctor.  ?  ?  ? ?  ? ?atorvastatin 40 MG tablet ?Commonly known as: LIPITOR ?Take 40 mg by mouth daily. ?  ?enalapril 5 MG tablet ?Commonly known as: VASOTEC ?Take 5 mg by mouth daily. ?  ?escitalopram 20 MG tablet ?Commonly known as: LEXAPRO ?Take 20 mg by mouth daily. ?  ?famotidine 20 MG tablet ?Commonly known as: Pepcid ?Take 1 tablet (20 mg total) by mouth daily. ?What changed: when to take this ?  ?Gemtesa 75 MG Tabs ?Generic drug: Vibegron ?Take 1 capsule by mouth daily. ?  ?lactose free nutrition Liqd ?Take 237 mLs by mouth 2 (two) times daily between meals. ?What changed:  ?when to take this ?reasons to take this ?  ?mirabegron ER 50 MG Tb24 tablet ?Commonly known as: Myrbetriq ?Take 1 tablet (50 mg total) by mouth daily. ?  ?mirtazapine 30 MG tablet ?Commonly known as: REMERON ?Take 30 mg by mouth at bedtime. ?  ?silodosin 8 MG Caps capsule ?Commonly known as: RAPAFLO ?Take 1 capsule (8 mg total) by mouth daily with breakfast. ?  ?traZODone 50 MG tablet ?Commonly known as: DESYREL ?Take 50 mg by mouth at bedtime. ?  ?Trospium Chloride 60 MG Cp24 ?Take 1 capsule (60 mg  total) by mouth daily. ?  ?vitamin B-12 500 MCG tablet ?Commonly known as: CYANOCOBALAMIN ?Take 500 mcg by mouth daily. ?  ?Vitamin D 50 MCG (2000 UT) tablet ?Take 2,000 Units by mouth daily. ?  ? ?  ? ? ?Allergies:  ?Allergies  ?Allergen Reactions  ? Flagyl [Metronidazole Hcl] Itching and Rash  ? ? ?Family History: ?Family History  ?Problem Relation Age of Onset  ? Lymphoma Mother   ? Coronary artery disease Father   ? Esophageal cancer Brother   ? Lung cancer Brother   ? Ovarian cancer Daughter 33  ? Colon cancer Neg Hx   ? Rectal cancer Neg Hx   ? Stomach cancer Neg Hx   ? Parkinson's disease Neg Hx   ? ? ?Social History:  reports that she has quit smoking. She has never used smokeless tobacco. She reports that she does not  drink alcohol and does not use drugs. ? ?ROS: ?All other review of systems were reviewed and are negative except what is noted above in HPI ? ?Physical Exam: ?BP 126/83   Pulse 61   ?Constitutional:  Alert and oriented, No acute distress. ?HEENT: [place] AT, moist mucus membranes.  Trachea midline, no masses. ?Cardiovascular: No clubbing, cyanosis, or edema. ?Respiratory: Normal respiratory effort, no increased work of breathing. ?GI: Abdomen is soft, nontender, nondistended, no abdominal masses ?GU: No CVA tenderness.  ?Lymph: No cervical or inguinal lymphadenopathy. ?Skin: No rashes, bruises or suspicious lesions. ?Neurologic: Grossly intact, no focal deficits, moving all 4 extremities. ?Psychiatric: Normal mood and affect. ? ?Laboratory Data: ?Lab Results  ?Component Value Date  ? WBC 5.4 06/24/2020  ? HGB 14.0 06/24/2020  ? HCT 43.3 06/24/2020  ? MCV 92.7 06/24/2020  ? PLT 333 06/24/2020  ? ? ?Lab Results  ?Component Value Date  ? CREATININE 1.01 (H) 08/18/2021  ? ? ?No results found for: PSA ? ?No results found for: TESTOSTERONE ? ?No results found for: HGBA1C ? ?Urinalysis ?   ?Component Value Date/Time  ? APPEARANCEUR Clear 01/29/2020 1648  ? GLUCOSEU Negative 01/29/2020 1648  ? BILIRUBINUR Negative 01/29/2020 1648  ? PROTEINUR Negative 01/29/2020 1648  ? NITRITE Negative 01/29/2020 1648  ? LEUKOCYTESUR Trace (A) 01/29/2020 1648  ? ? ?Lab Results  ?Component Value Date  ? LABMICR See below: 01/29/2020  ? WBCUA 0-5 01/29/2020  ? LABEPIT 0-10 01/29/2020  ? BACTERIA Few (A) 01/29/2020  ? ? ?Pertinent Imaging: ? ?No results found for this or any previous visit. ? ?No results found for this or any previous visit. ? ?No results found for this or any previous visit. ? ?No results found for this or any previous visit. ? ?No results found for this or any previous visit. ? ?No results found for this or any previous visit. ? ?No results found for this or any previous visit. ? ?No results found for this or any previous  visit. ? ? ?Assessment & Plan:   ? ?1. Urge incontinence ?-Mirabegron '50mg'$   ?- BLADDER SCAN AMB NON-IMAGING ?- Urinalysis, Routine w reflex microscopic ? ?2. Incomplete emptying of bladder ?-continue double voiding.  ?- BLADDER SCAN AMB NON-IMAGING ?- Urinalysis, Routine w reflex microscopic ? ? ?No follow-ups on file. ? ?Nicolette Bang, MD ? ?[facility] [place] ?  ?

## 2021-09-07 NOTE — Progress Notes (Signed)
post void residual=133 ?

## 2021-09-07 NOTE — Patient Instructions (Signed)

## 2021-09-08 ENCOUNTER — Ambulatory Visit: Payer: Medicare Other | Admitting: Urology

## 2021-09-10 ENCOUNTER — Encounter (HOSPITAL_COMMUNITY): Payer: 59

## 2021-09-10 ENCOUNTER — Other Ambulatory Visit: Payer: Self-pay

## 2021-09-10 ENCOUNTER — Encounter (HOSPITAL_COMMUNITY)
Admission: RE | Admit: 2021-09-10 | Discharge: 2021-09-10 | Disposition: A | Discharge status: Home / Self Care | Payer: 59 | Source: Ambulatory Visit | Attending: Neurology | Admitting: Neurology

## 2021-09-10 DIAGNOSIS — R441 Visual hallucinations: Secondary | ICD-10-CM | POA: Diagnosis not present

## 2021-09-10 DIAGNOSIS — R440 Auditory hallucinations: Secondary | ICD-10-CM | POA: Insufficient documentation

## 2021-09-10 DIAGNOSIS — R251 Tremor, unspecified: Secondary | ICD-10-CM | POA: Diagnosis not present

## 2021-09-10 DIAGNOSIS — R419 Unspecified symptoms and signs involving cognitive functions and awareness: Secondary | ICD-10-CM | POA: Diagnosis not present

## 2021-09-10 MED ORDER — POTASSIUM IODIDE (ANTIDOTE) 130 MG PO TABS: 130.0000 mg | ORAL_TABLET | Freq: Once | ORAL | Status: DC

## 2021-09-10 MED ORDER — POTASSIUM IODIDE (ANTIDOTE) 130 MG PO TABS
ORAL_TABLET | ORAL | Status: AC
  Filled 2021-09-10: qty 1

## 2021-09-10 MED ORDER — IOFLUPANE I 123 185 MBQ/2.5ML IV SOLN
4.8000 | Freq: Once | INTRAVENOUS | Status: AC | PRN
  Administered 2021-09-10: 4.8 via INTRAVENOUS
  Filled 2021-09-10: qty 5

## 2021-09-14 ENCOUNTER — Telehealth: Payer: Self-pay | Admitting: *Deleted

## 2021-09-14 NOTE — Telephone Encounter (Signed)
LVM for patient to call back to discuss results of [place]  Did call Amy Zenia Resides (daughter) checked DPR. VM not set up ?

## 2021-09-14 NOTE — Telephone Encounter (Signed)
-----   Message from Star Age, MD sent at 09/10/2021  5:16 PM EDT ----- ?Please advise patient or her daughter on DPR that her recent DaTscan was reported as normal and does not show any evidence to support underlying Parkinson's disease.  Overall, this is good news of course and as discussed during her recent office visit, I did not think she had Parkinson's-like changes.  As discussed, I recommend that she follow-up with her primary care at this point in time.   ?

## 2021-09-15 NOTE — Telephone Encounter (Signed)
Spoke with patient gave DatScan results. Gave patient Dr Rexene Alberts recommendation. Pt thanked me for calling  ?

## 2021-10-16 ENCOUNTER — Ambulatory Visit: Payer: Medicare Other | Admitting: Urology

## 2021-12-24 ENCOUNTER — Ambulatory Visit: Payer: 59 | Admitting: Neurology

## 2022-01-13 ENCOUNTER — Ambulatory Visit (INDEPENDENT_AMBULATORY_CARE_PROVIDER_SITE_OTHER): Payer: 59 | Admitting: Urology

## 2022-01-13 VITALS — BP 129/78 | HR 71

## 2022-01-13 DIAGNOSIS — N3941 Urge incontinence: Secondary | ICD-10-CM | POA: Diagnosis not present

## 2022-01-13 DIAGNOSIS — R339 Retention of urine, unspecified: Secondary | ICD-10-CM | POA: Diagnosis not present

## 2022-01-13 DIAGNOSIS — N3000 Acute cystitis without hematuria: Secondary | ICD-10-CM

## 2022-01-13 LAB — URINALYSIS, ROUTINE W REFLEX MICROSCOPIC
Bilirubin, UA: NEGATIVE
Glucose, UA: NEGATIVE
Ketones, UA: NEGATIVE
Nitrite, UA: NEGATIVE
Protein,UA: NEGATIVE
RBC, UA: NEGATIVE
Specific Gravity, UA: 1.01 (ref 1.005–1.030)
Urobilinogen, Ur: 0.2 mg/dL (ref 0.2–1.0)
pH, UA: 6 (ref 5.0–7.5)

## 2022-01-13 LAB — MICROSCOPIC EXAMINATION
RBC, Urine: NONE SEEN /hpf (ref 0–2)
Renal Epithel, UA: NONE SEEN /hpf

## 2022-01-13 LAB — BLADDER SCAN AMB NON-IMAGING: Scan Result: 0

## 2022-01-13 MED ORDER — DOXYCYCLINE HYCLATE 100 MG PO CAPS: 100.0000 mg | ORAL_CAPSULE | Freq: Two times a day (BID) | ORAL | 0 refills | Status: DC

## 2022-01-13 MED ORDER — MIRABEGRON ER 50 MG PO TB24: 50.0000 mg | ORAL_TABLET | Freq: Every day | ORAL | 11 refills | Status: DC

## 2022-01-13 NOTE — Progress Notes (Signed)
01/13/2022 2:13 PM   Anna Jacobs Apr 23, 1941 [id number]  Referring provider: Monico Blitz, MD [address] [place],  [place] [address]  Urinary frequency   HPI: Anna Jacobs is a 81yo her for followup for urinary frequency. She notes over the past month her urinary frequency has worsened. She has new nocturia 1-2x. She remains on mirbegron '50mg'$  daily. She denies straining to urinate. She has failed trospium and gemtesa.    PMH: Past Medical History:  Diagnosis Date   Allergy    Anxiety    B12 deficiency    Degenerative joint disease    Depression    GERD (gastroesophageal reflux disease)    EGD 07/02/2003 Dr. Gala Jacobs   Hearing loss    Hyperlipemia    Hypertension    IBS (irritable bowel syndrome)    Colonoscopy normal Dr. Gala Jacobs 06/2003   Insomnia    Parkinson's disease 2001   Peripheral neuropathy    Restless leg syndrome    RP (retinitis pigmentosa)    Sleep apnea    TIA (transient ischemic attack)    Urinary incontinence     Surgical History: Past Surgical History:  Procedure Laterality Date   Benign breast biopsy     left (remotely) and right (06/2012)   BIOPSY N/A 11/29/2012   Procedure: BIOPSY;  Surgeon: Anna Dolin, MD;  [facility];  Service: Endoscopy;  Laterality: N/A;  RANDOM BIOPSY   CHOLECYSTECTOMY  2000   COLONOSCOPY N/A 11/29/2012   IRC:[id number] rectal polyps-removed Otherwise normal examination distal terminal ileum appeared normal. Random colon biopsies were negative for microscopic colitis. Rectum polyp, tubular adenoma. Next TCS 11/2019.   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 If you have any questions, ask your nurse or doctor.          atorvastatin 40 MG tablet Commonly known as: LIPITOR Take 40 mg by mouth daily.   enalapril 5 MG tablet Commonly known as: VASOTEC Take 5 mg by mouth daily.   escitalopram 20 MG tablet Commonly known as: LEXAPRO Take 20 mg by mouth daily.   famotidine 20 MG tablet Commonly known as: Pepcid Take 1 tablet (20 mg total) by mouth daily. What changed: when to take this   Gemtesa 75 MG Tabs Generic drug: Vibegron Take 1 capsule by mouth daily.   lactose free nutrition Liqd Take 237 mLs by mouth 2 (two) times daily between meals. What changed:  when to take this reasons to take this   mirabegron ER 50 MG Tb24 tablet Commonly known as: Myrbetriq Take 1 tablet (50 mg total) by mouth daily.   mirtazapine 30 MG tablet Commonly known as: REMERON Take 30 mg by mouth at bedtime.   traZODone 50 MG tablet Commonly known as: DESYREL Take 50 mg by mouth at bedtime.   vitamin B-12 500 MCG tablet Commonly known as: CYANOCOBALAMIN Take 500 mcg by mouth daily.   Vitamin D 50 MCG (2000 UT) tablet Take 2,000 Units by mouth daily.  Allergies:  Allergies  Allergen Reactions   Flagyl [Metronidazole Hcl] Itching and Rash    Family History: Family History  Problem Relation Age of Onset   Lymphoma Mother    Coronary artery disease Father    Esophageal cancer Brother    Lung cancer Brother    Ovarian cancer Daughter 4   Colon cancer Neg Hx    Rectal cancer Neg Hx    Stomach cancer Neg Hx    Parkinson's disease Neg Hx     Social History:  reports that she has quit smoking. She has never used smokeless tobacco. She reports that she does not drink alcohol and does not use drugs.  ROS: All other review of systems were reviewed and are negative except what is noted above in HPI  Physical Exam: BP 129/78   Pulse 71   Constitutional:  Alert and oriented, No acute distress. HEENT: [place] AT, moist  mucus membranes.  Trachea midline, no masses. Cardiovascular: No clubbing, cyanosis, or edema. Respiratory: Normal respiratory effort, no increased work of breathing. GI: Abdomen is soft, nontender, nondistended, no abdominal masses GU: No CVA tenderness.  Lymph: No cervical or inguinal lymphadenopathy. Skin: No rashes, bruises or suspicious lesions. Neurologic: Grossly intact, no focal deficits, moving all 4 extremities. Psychiatric: Normal mood and affect.  Laboratory Data: Lab Results  Component Value Date   WBC 5.4 06/24/2020   HGB 14.0 06/24/2020   HCT 43.3 06/24/2020   MCV 92.7 06/24/2020   PLT 333 06/24/2020    Lab Results  Component Value Date   CREATININE 1.01 (H) 08/18/2021    No results found for: "PSA"  No results found for: "TESTOSTERONE"  No results found for: "HGBA1C"  Urinalysis    Component Value Date/Time   APPEARANCEUR Cloudy (A) 09/07/2021 1119   GLUCOSEU Negative 09/07/2021 1119   BILIRUBINUR Negative 09/07/2021 1119   PROTEINUR 2+ (A) 09/07/2021 1119   NITRITE Negative 09/07/2021 1119   LEUKOCYTESUR 3+ (A) 09/07/2021 1119    Lab Results  Component Value Date   LABMICR Comment 09/07/2021   WBCUA 0-5 01/29/2020   LABEPIT 0-10 01/29/2020   BACTERIA Few (A) 01/29/2020    Pertinent Imaging:  No results found for this or any previous visit.  No results found for this or any previous visit.  No results found for this or any previous visit.  No results found for this or any previous visit.  No results found for this or any previous visit.  No results found for this or any previous visit.  No results found for this or any previous visit.  No results found for this or any previous visit.   Assessment & Plan:    1. Incomplete emptying of bladder Continue double voiding - BLADDER SCAN AMB NON-IMAGING  2. Urge incontinence We will trial mirabegron '25mg'$  daily - Urinalysis, Routine w reflex microscopic   No follow-ups on  file.  Anna Bang, MD  [facility] [place]

## 2022-01-13 NOTE — Progress Notes (Signed)
post void residual=0 ?

## 2022-01-15 LAB — URINE CULTURE

## 2022-01-21 ENCOUNTER — Encounter: Payer: Self-pay | Admitting: Urology

## 2022-01-21 ENCOUNTER — Telehealth: Payer: Self-pay

## 2022-01-21 NOTE — Patient Instructions (Signed)

## 2022-01-21 NOTE — Telephone Encounter (Signed)
-----   Message from Cleon Gustin, MD sent at 01/20/2022  5:08 PM EDT ----- Continue doxy ----- Message ----- From: Audie Box, CMA Sent: 01/15/2022   8:18 AM EDT To: Cleon Gustin, MD  Please review, patient taking doxycycline

## 2022-01-21 NOTE — Telephone Encounter (Signed)
Patient aware and finished the last dose on 08/02.  She will f/u as scheduled.

## 2022-02-15 ENCOUNTER — Ambulatory Visit (INDEPENDENT_AMBULATORY_CARE_PROVIDER_SITE_OTHER): Payer: 59 | Admitting: Physician Assistant

## 2022-02-15 VITALS — BP 116/79 | HR 87

## 2022-02-15 DIAGNOSIS — R350 Frequency of micturition: Secondary | ICD-10-CM | POA: Diagnosis not present

## 2022-02-15 DIAGNOSIS — K5900 Constipation, unspecified: Secondary | ICD-10-CM | POA: Diagnosis not present

## 2022-02-15 DIAGNOSIS — R339 Retention of urine, unspecified: Secondary | ICD-10-CM

## 2022-02-15 DIAGNOSIS — N3941 Urge incontinence: Secondary | ICD-10-CM

## 2022-02-15 LAB — URINALYSIS, ROUTINE W REFLEX MICROSCOPIC
Bilirubin, UA: NEGATIVE
Glucose, UA: NEGATIVE
Ketones, UA: NEGATIVE
Leukocytes,UA: NEGATIVE
Nitrite, UA: NEGATIVE
Protein,UA: NEGATIVE
Specific Gravity, UA: <1.005 — ABNORMAL LOW (ref 1.005–1.030)
Urobilinogen, Ur: 0.2 mg/dL (ref 0.2–1.0)
pH, UA: 5 (ref 5.0–7.5)

## 2022-02-15 LAB — MICROSCOPIC EXAMINATION: Renal Epithel, UA: NONE SEEN /hpf

## 2022-02-15 LAB — BLADDER SCAN AMB NON-IMAGING: Scan Result: 178

## 2022-02-15 MED ORDER — DOCUSATE SODIUM 100 MG PO CAPS: 100.0000 mg | ORAL_CAPSULE | Freq: Two times a day (BID) | ORAL | 0 refills | Status: AC

## 2022-02-15 NOTE — Progress Notes (Unsigned)
Assessment: 1. Urine frequency - Urinalysis, Routine w reflex microscopic - BLADDER SCAN AMB NON-IMAGING  2. Urge incontinence  3. Incomplete emptying of bladder  4. Constipation, unspecified constipation type    Plan: Continue Myrbetriq and increase fluid intake to help relieve constipation.  Colace for hard stools and Miralax as discussed. FU in 6 months for recheck and UA.   Chief Complaint: No chief complaint on file.   HPI: Anna Jacobs is a 81 y.o. female who presents for continued evaluation of urinary frequency. Pt placed on Myrbetriq at her visit last month. She continues to c/o frequency and states she has difficulty voiding due to straining to pass hard stools. No straining to void after stool passes.  UA= clear PVR=136m   01/13/22 Anna FLeinweberis a 89yoher for followup for urinary frequency. She notes over the past month her urinary frequency has worsened. She has new nocturia 1-2x. She remains on mirbegron '50mg'$  daily. She denies straining to urinate. She has failed trospium and gemtesa.   Portions of the above documentation were copied from a prior visit for review purposes only.  Allergies: Allergies  Allergen Reactions   Flagyl [Metronidazole Hcl] Itching and Rash    PMH: Past Medical History:  Diagnosis Date   Allergy    Anxiety    B12 deficiency    Degenerative joint disease    Depression    GERD (gastroesophageal reflux disease)    EGD 07/02/2003 Dr. RGala Romney  Hearing loss    Hyperlipemia    Hypertension    IBS (irritable bowel syndrome)    Colonoscopy normal Dr. RGala Romney1/2005   Insomnia    Parkinson's disease 2001   Peripheral neuropathy    Restless leg syndrome    RP (retinitis pigmentosa)    Sleep apnea    TIA (transient ischemic attack)    Urinary incontinence     PSH: Past Surgical History:  Procedure Laterality Date   Benign breast biopsy     left (remotely) and right (06/2012)   BIOPSY N/A 11/29/2012   Procedure: BIOPSY;   Surgeon: RDaneil Dolin MD;  [facility];  Service: Endoscopy;  Laterality: N/A;  RANDOM BIOPSY   CHOLECYSTECTOMY  2000   COLONOSCOPY N/A 11/29/2012   RHDQ:[id number] polyps-removed Otherwise normal examination distal terminal ileum appeared normal. Random colon biopsies were negative for microscopic colitis. Rectum polyp, tubular adenoma. Next TCS 11/2019.   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 Trachea midline ABDOMEN:  Soft, non-tender, no masses EXTREMITIES:  Moves all extremities well, without clubbing, cyanosis, or edema NEUROLOGIC:  Alert and oriented x 3, normal gait, CN II-XII grossly intact MENTAL STATUS:  appropriate BACK:  Non-tender to palpation, No CVAT SKIN:  Warm, dry, and intact   Results: Laboratory  Data: Lab Results  Component Value Date   WBC 5.4 06/24/2020   HGB 14.0 06/24/2020   HCT 43.3 06/24/2020   MCV 92.7 06/24/2020   PLT 333 06/24/2020    Lab Results  Component Value Date   CREATININE 1.01 (H) 08/18/2021    No results found for: "PSA"  No results found for: "TESTOSTERONE"  No results found for: "HGBA1C"  Urinalysis    Component Value Date/Time   APPEARANCEUR Clear 01/13/2022 1539   GLUCOSEU Negative 01/13/2022 1539   BILIRUBINUR Negative 01/13/2022 1539   PROTEINUR Negative 01/13/2022 1539   NITRITE  Negative 01/13/2022 1539   LEUKOCYTESUR Trace (A) 01/13/2022 1539    Lab Results  Component Value Date   LABMICR See below: 01/13/2022   WBCUA 6-10 (A) 01/13/2022   LABEPIT 0-10 01/13/2022   BACTERIA Few 01/13/2022    Pertinent Imaging: No results found for this or any previous visit.  No results found for this or any previous visit.  No results found for this or any previous visit.  No results found for this or any previous visit.  No results found for this or any previous visit.  No results found for this or any previous visit.  No results found for this or any previous visit.  No results found for this or any previous visit.  No results found for this or any previous visit (from the past 24 hour(s)).

## 2022-02-23 NOTE — Progress Notes (Unsigned)
Referring-Anna Jimmye Norman, MD Reason for referral-dyspnea  HPI: 81 year old female for evaluation of dyspnea at request of Stana Bunting, MD.  Laboratories August 2023 showed creatinine 0.89, normal hemoglobin, chest x-ray without infiltrate by report.  Current Outpatient Medications  Medication Sig Dispense Refill   atorvastatin (LIPITOR) 40 MG tablet Take 40 mg by mouth daily.     Cholecalciferol (VITAMIN D) 50 MCG (2000 UT) tablet Take 2,000 Units by mouth daily. (Patient not taking: Reported on 02/15/2022)     docusate sodium (COLACE) 100 MG capsule Take 1 capsule (100 mg total) by mouth 2 (two) times daily. Take as needed for hard stools 30 capsule 0   doxycycline (VIBRAMYCIN) 100 MG capsule Take 1 capsule (100 mg total) by mouth every 12 (twelve) hours. (Patient not taking: Reported on 02/15/2022) 14 capsule 0   enalapril (VASOTEC) 5 MG tablet Take 5 mg by mouth daily.     escitalopram (LEXAPRO) 20 MG tablet Take 20 mg by mouth daily.     famotidine (PEPCID) 20 MG tablet Take 1 tablet (20 mg total) by mouth daily. (Patient taking differently: Take 20 mg by mouth 2 (two) times daily.) 30 tablet 3   lactose free nutrition (BOOST) LIQD Take 237 mLs by mouth 2 (two) times daily between meals. (Patient not taking: Reported on 02/15/2022) 14220 mL 2   mirabegron ER (MYRBETRIQ) 50 MG TB24 tablet Take 1 tablet (50 mg total) by mouth daily. 30 tablet 11   mirtazapine (REMERON) 30 MG tablet Take 30 mg by mouth at bedtime.     traZODone (DESYREL) 50 MG tablet Take 50 mg by mouth at bedtime. (Patient not taking: Reported on 02/15/2022)     vitamin B-12 (CYANOCOBALAMIN) 500 MCG tablet Take 500 mcg by mouth daily.     Current Facility-Administered Medications  Medication Dose Route Frequency Provider Last Rate Last Admin   0.9 %  sodium chloride infusion  500 mL Intravenous Once Milus Banister, MD        Allergies  Allergen Reactions   Flagyl [Metronidazole Hcl] Itching and Rash      Past Medical History:  Diagnosis Date   Allergy    Anxiety    B12 deficiency    Degenerative joint disease    Depression    GERD (gastroesophageal reflux disease)    EGD 07/02/2003 Dr. Gala Romney   Hearing loss    Hyperlipemia    Hypertension    IBS (irritable bowel syndrome)    Colonoscopy normal Dr. Gala Romney 06/2003   Insomnia    Parkinson's disease 2001   Peripheral neuropathy    Restless leg syndrome    RP (retinitis pigmentosa)    Sleep apnea    TIA (transient ischemic attack)    Urinary incontinence     Past Surgical History:  Procedure Laterality Date   Benign breast biopsy     left (remotely) and right (06/2012)   BIOPSY N/A 11/29/2012   Procedure: BIOPSY;  Surgeon: Daneil Dolin, MD;  [facility];  Service: Endoscopy;  Laterality: N/A;  RANDOM BIOPSY   CHOLECYSTECTOMY  2000   COLONOSCOPY N/A 11/29/2012   EGB:[id number] rectal polyps-removed Otherwise normal examination distal terminal ileum appeared normal. Random colon biopsies were negative for microscopic colitis. Rectum polyp, tubular adenoma. Next TCS 11/2019.   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   Procedure: TOTAL KNEE ARTHROPLASTY;  Surgeon: Earlie Server, MD;  [facility];  Service: Orthopedics;  Laterality: Right;    Social History   Socioeconomic History   Marital status: Divorced    Spouse name: Not on file   Number of children: 3   Years of education: Not on file   Highest education level: Not on file  Occupational History   Occupation: disabled    Comment: Previously worked at [employer] Use   Smoking status: Former   Smokeless tobacco: Never  Scientific laboratory technician Use: Never used  Substance and Sexual Activity   Alcohol use: No   Drug use: No   Sexual activity: Not Currently  Other Topics Concern   Not on file  Social History Narrative   Not on file   Social Determinants of Health   Financial Resource  Strain: Not on file  Food Insecurity: Not on file  Transportation Needs: Not on file  Physical Activity: Not on file  Stress: Not on file  Social Connections: Not on file  Intimate Partner Violence: Not on file    Family History  Problem Relation Age of Onset   Lymphoma Mother    Coronary artery disease Father    Esophageal cancer Brother    Lung cancer Brother    Ovarian cancer Daughter 63   Colon cancer Neg Hx    Rectal cancer Neg Hx    Stomach cancer Neg Hx    Parkinson's disease Neg Hx     ROS: no fevers or chills, productive cough, hemoptysis, dysphasia, odynophagia, melena, hematochezia, dysuria, hematuria, rash, seizure activity, orthopnea, PND, pedal edema, claudication. Remaining systems are negative.  Physical Exam:   There were no vitals taken for this visit.  General:  Well developed/well nourished in NAD Skin warm/dry Patient not depressed No peripheral clubbing Back-normal HEENT-normal/normal eyelids Neck supple/normal carotid upstroke bilaterally; no bruits; no JVD; no thyromegaly chest - CTA/ normal expansion CV - RRR/normal S1 and S2; no murmurs, rubs or gallops;  PMI nondisplaced Abdomen -NT/ND, no HSM, no mass, + bowel sounds, no bruit 2+ femoral pulses, no bruits Ext-no edema, chords, 2+ DP Neuro-grossly nonfocal  ECG -February 17, 2022-normal sinus rhythm with no ST changes.  Personally reviewed  A/P  1 dyspnea-  2 hyperlipidemia-  3 hypertension-  Kirk Ruths, MD

## 2022-02-24 ENCOUNTER — Ambulatory Visit: Payer: 59 | Attending: Cardiology | Admitting: Cardiology

## 2022-02-24 ENCOUNTER — Encounter: Payer: Self-pay | Admitting: Cardiology

## 2022-02-24 VITALS — BP 140/80 | HR 80 | Ht 61.0 in | Wt 146.2 lb

## 2022-02-24 DIAGNOSIS — I10 Essential (primary) hypertension: Secondary | ICD-10-CM | POA: Diagnosis not present

## 2022-02-24 DIAGNOSIS — R0602 Shortness of breath: Secondary | ICD-10-CM

## 2022-02-24 DIAGNOSIS — E7800 Pure hypercholesterolemia, unspecified: Secondary | ICD-10-CM

## 2022-02-24 NOTE — Patient Instructions (Addendum)
  Testing/Procedures:  Your physician has requested that you have a lexiscan myoview. For further information please visit [email or web address]. Please follow instruction sheet, as given. [facility] has requested that you have an echocardiogram. Echocardiography is a painless test that uses sound waves to create images of your heart. It provides your doctor with information about the size and shape of your heart and how well your heart's chambers and valves are working. This procedure takes approximately one hour. There are no restrictions for this procedure. Anna Jacobs, you and your health needs are our priority.  As part of our continuing mission to provide you with exceptional heart care, we have created designated Provider Care Teams.  These Care Teams include your primary Cardiologist (physician) and Advanced Practice Providers (APPs -  Physician Assistants and Nurse Practitioners) who all work together to provide you with the care you need, when you need it.  We recommend signing up for the patient portal called "MyChart".  Sign up information is provided on this After Visit Summary.  MyChart is used to connect with patients for Virtual Visits (Telemedicine).  Patients are able to view lab/test results, encounter notes, upcoming appointments, etc.  Non-urgent messages can be sent to your provider as well.   To learn more about what you can do with MyChart, go to [email or web address].    Your next appointment:    AS NEEDED

## 2022-02-25 ENCOUNTER — Encounter: Payer: Self-pay | Admitting: *Deleted

## 2022-02-25 ENCOUNTER — Ambulatory Visit (HOSPITAL_COMMUNITY)
Admission: RE | Admit: 2022-02-25 | Discharge: 2022-02-25 | Disposition: A | Discharge status: Home / Self Care | Payer: 59 | Source: Ambulatory Visit | Attending: Cardiology | Admitting: Cardiology

## 2022-02-25 DIAGNOSIS — R0602 Shortness of breath: Secondary | ICD-10-CM | POA: Diagnosis present

## 2022-02-25 LAB — ECHOCARDIOGRAM COMPLETE
Area-P 1/2: 3.77 cm2
S' Lateral: 2.8 cm

## 2022-02-25 NOTE — Progress Notes (Signed)
*  PRELIMINARY RESULTS* Echocardiogram 2D Echocardiogram has been performed.  Anna Jacobs 02/25/2022, 2:07 PM

## 2022-03-01 ENCOUNTER — Telehealth (HOSPITAL_COMMUNITY): Payer: Self-pay | Admitting: *Deleted

## 2022-03-01 NOTE — Telephone Encounter (Signed)
Left message on voicemail in reference to upcoming appointment scheduled for  03/08/22 Phone number given for a call back so details instructions can be given.  Anna Jacobs

## 2022-03-04 ENCOUNTER — Telehealth (HOSPITAL_COMMUNITY): Payer: Self-pay | Admitting: *Deleted

## 2022-03-04 NOTE — Telephone Encounter (Signed)
Left message on voicemail in reference to upcoming appointment scheduled for  03/08/22 Phone number given for a call back so details instructions can be given.  Anna Jacobs

## 2022-03-08 ENCOUNTER — Ambulatory Visit (HOSPITAL_COMMUNITY): Payer: 59

## 2022-03-11 ENCOUNTER — Telehealth (HOSPITAL_COMMUNITY): Payer: Self-pay

## 2022-03-11 ENCOUNTER — Other Ambulatory Visit: Payer: Self-pay | Admitting: *Deleted

## 2022-03-11 ENCOUNTER — Encounter (HOSPITAL_COMMUNITY): Payer: Self-pay

## 2022-03-11 ENCOUNTER — Ambulatory Visit (HOSPITAL_COMMUNITY): Payer: 59

## 2022-03-11 DIAGNOSIS — R0602 Shortness of breath: Secondary | ICD-10-CM

## 2022-03-15 ENCOUNTER — Other Ambulatory Visit: Payer: Self-pay

## 2022-03-15 DIAGNOSIS — R0602 Shortness of breath: Secondary | ICD-10-CM

## 2022-03-17 ENCOUNTER — Encounter (HOSPITAL_COMMUNITY)
Admission: RE | Admit: 2022-03-17 | Discharge: 2022-03-17 | Disposition: A | Discharge status: Home / Self Care | Payer: 59 | Source: Ambulatory Visit | Attending: Cardiology | Admitting: Cardiology

## 2022-03-17 ENCOUNTER — Ambulatory Visit (HOSPITAL_COMMUNITY): Admission: RE | Admit: 2022-03-17 | Payer: 59 | Source: Ambulatory Visit

## 2022-03-17 DIAGNOSIS — R0602 Shortness of breath: Secondary | ICD-10-CM | POA: Insufficient documentation

## 2022-03-18 ENCOUNTER — Encounter (HOSPITAL_COMMUNITY): Payer: Self-pay

## 2022-03-18 ENCOUNTER — Ambulatory Visit (HOSPITAL_COMMUNITY)
Admission: RE | Admit: 2022-03-18 | Discharge: 2022-03-18 | Disposition: A | Discharge status: Home / Self Care | Payer: 59 | Source: Ambulatory Visit | Attending: Cardiology | Admitting: Cardiology

## 2022-03-18 DIAGNOSIS — R0602 Shortness of breath: Secondary | ICD-10-CM | POA: Diagnosis present

## 2022-03-18 LAB — NM MYOCAR MULTI W/SPECT W/WALL MOTION / EF
LV dias vol: 80 mL (ref 46–106)
LV sys vol: 26 mL
Nuc Stress EF: 67 %
Peak HR: 115 {beats}/min
RATE: 0.4
Rest HR: 65 {beats}/min
Rest Nuclear Isotope Dose: 11 mCi
SDS: 3
SRS: 1
SSS: 4
ST Depression (mm): 0 mm
Stress Nuclear Isotope Dose: 32 mCi
TID: 1.05

## 2022-03-18 MED ORDER — TECHNETIUM TC 99M TETROFOSMIN IV KIT
10.0000 | PACK | Freq: Once | INTRAVENOUS | Status: AC | PRN
  Administered 2022-03-18: 11 via INTRAVENOUS

## 2022-03-18 MED ORDER — REGADENOSON 0.4 MG/5ML IV SOLN
INTRAVENOUS | Status: AC
  Administered 2022-03-18: 0.4 mg
  Filled 2022-03-18: qty 5

## 2022-03-18 MED ORDER — TECHNETIUM TC 99M TETROFOSMIN IV KIT
30.0000 | PACK | Freq: Once | INTRAVENOUS | Status: AC | PRN
  Administered 2022-03-18: 32 via INTRAVENOUS

## 2022-03-18 MED ORDER — SODIUM CHLORIDE FLUSH 0.9 % IV SOLN
INTRAVENOUS | Status: AC
  Administered 2022-03-18: 10 mL
  Filled 2022-03-18: qty 10

## 2022-05-10 ENCOUNTER — Ambulatory Visit (INDEPENDENT_AMBULATORY_CARE_PROVIDER_SITE_OTHER): Payer: 59 | Admitting: Internal Medicine

## 2022-05-10 ENCOUNTER — Ambulatory Visit (HOSPITAL_COMMUNITY)
Admission: RE | Admit: 2022-05-10 | Discharge: 2022-05-10 | Disposition: A | Discharge status: Home / Self Care | Payer: 59 | Source: Ambulatory Visit | Attending: Internal Medicine | Admitting: Internal Medicine

## 2022-05-10 ENCOUNTER — Encounter: Payer: Self-pay | Admitting: Internal Medicine

## 2022-05-10 VITALS — BP 132/68 | HR 84 | Temp 98.2°F | Ht 61.0 in

## 2022-05-10 DIAGNOSIS — I10 Essential (primary) hypertension: Secondary | ICD-10-CM | POA: Diagnosis not present

## 2022-05-10 DIAGNOSIS — R0609 Other forms of dyspnea: Secondary | ICD-10-CM | POA: Diagnosis not present

## 2022-05-10 MED ORDER — VALSARTAN 40 MG PO TABS: 40.0000 mg | ORAL_TABLET | Freq: Every day | ORAL | 11 refills | Status: DC

## 2022-05-10 NOTE — Assessment & Plan Note (Signed)
Stopped smoking 1970s s apparent sequelae - onset 2023  - Echo 02/25/93  Grade 1 diastolic dysfunction/ mild MR  - Try off acei 05/10/2022 due to assoc globus    When respiratory symptoms begin or become refractory well after a patient reports complete smoking cessation,  Especially when this wasn't the case while they were smoking, a red flag is raised based on the work of Dr Kris Mouton which states:  if you quit smoking when your best day FEV1 is still well preserved it is highly unlikely you will progress to severe disease.  That is to say, once the smoking stops,  the symptoms should not suddenly erupt or markedly worsen.  If so, the differential diagnosis should include  obesity/deconditioning,  LPR/Reflux/Aspiration syndromes,  occult CHF(excluded by echo above) , or  especially side effect of medications commonly used in this population, esp ACEi (see separate a/p)   Rec try off acei and see if globus/ perceived need for saba resolve and if not return for pfts

## 2022-05-10 NOTE — Patient Instructions (Addendum)
Stop enalapril and start valsartan 40 mg one daily - ok to take twice daily if needed and all your upper respiratory symptoms should should resolve over the next 4-6 weeks, if not return here.   Please remember to go to the  x-ray department  @  [facility] for your tests - we will call you with the results when they are available      If you are satisfied with your treatment plan,  let your doctor know and he/she can either refill your medications or you can return here when your prescription runs out.     If in any way you are not 100% satisfied,  please tell us.  If 100% better, tell your friends!  Pulmonary follow up is as needed

## 2022-05-10 NOTE — Assessment & Plan Note (Signed)
D/c vasotec and replace with Valsartan 40 mg one daily   In the best review of chronic cough to date ( NEJM 2016 375 [id number]) ,  ACEi are now felt to cause cough in up to  20% of pts which is a 4 fold increase from previous reports and does not include the variety of non-specific complaints we see in pulmonary clinic in pts on ACEi but previously attributed to another dx like  Copd/asthma and  include PNDS, throat   congestion= globus , "bronchitis", unexplained dyspnea and noct "strangling" sensations, and hoarseness, but also  atypical /refractory GERD symptoms like dysphagia and "bad heartburn" / choking on food  The only way I know  to prove this is not an "ACEi Case" is a trial off ACEi x a minimum of 6 weeks then regroup.   Try valsartan 40 mg titrate to adequate bp and f/u with PCP if better and if not then return here for full pfts rather than emprical bronchodilator trials given the challenge in a blind pt trying to achieve optimal technique and med reconciliation  Discussed in detail all the  indications, usual  risks and alternatives  relative to the benefits with patient who agrees to proceed with Rx as outlined.             Each maintenance medication was reviewed in detail including emphasizing most importantly the difference between maintenance and prns and under what circumstances the prns are to be triggered using an action plan format where appropriate.  Total time for H and P, chart review, counseling, r and generating customized AVS unique to this office visit / same day charting = 40 min

## 2022-05-10 NOTE — Progress Notes (Signed)
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   Sleeping  without nocturnal  or early am exacerbation  of respiratory  c/o's or need for noct saba. Also denies any obvious fluctuation of symptoms with weather or environmental changes or other aggravating or alleviating factors except as outlined above   No unusual exposure hx or h/o childhood pna/ asthma or knowledge of premature birth.  Current Allergies, Complete Past Medical History, Past Surgical History, Family History, and Social History were reviewed in [employer] record.  ROS  The following are not active complaints unless bolded Hoarseness, sore throat, dysphagia/globus, dental problems, itching, sneezing,  nasal congestion or discharge of excess mucus or purulent secretions, ear ache,   fever, chills, sweats, unintended wt loss or wt gain, classically pleuritic or exertional cp, orthopnea pnd or arm/hand swelling  or leg swelling, presyncope, palpitations, abdominal pain, anorexia, nausea, vomiting, diarrhea  or change  in bowel habits or change in bladder habits, change in stools or change in urine, dysuria, hematuria,  rash, arthralgias, visual complaints, headache, numbness, weakness or ataxia or problems with walking or coordination   change in mood or  memory.           Past Medical History:  Diagnosis Date   Allergy    Anxiety    B12 deficiency    Degenerative joint disease    Depression    GERD (gastroesophageal reflux disease)    EGD 07/02/2003 Dr. Gala Romney   Hearing loss    Hyperlipemia    Hypertension    IBS (irritable bowel syndrome)    Colonoscopy normal Dr. Gala Romney 06/2003   Insomnia    Parkinson's disease 2001   Peripheral neuropathy    Restless leg syndrome    RP (retinitis pigmentosa)    Sleep apnea    TIA (transient ischemic attack)    Urinary incontinence     Outpatient Medications Prior to Visit  Medication Sig Dispense Refill   atorvastatin (LIPITOR) 40 MG tablet Take 40 mg by mouth daily.     Cholecalciferol (VITAMIN D) 50 MCG (2000 UT) tablet Take 2,000 Units by mouth daily.     docusate sodium (COLACE) 100 MG capsule Take 1 capsule (100 mg total) by mouth 2 (two) times daily. Take as needed for hard stools 30 capsule 0   enalapril (VASOTEC) 5 MG tablet Take 5 mg by mouth daily.     escitalopram (LEXAPRO) 20 MG tablet Take  20 mg by mouth daily.     famotidine (PEPCID) 20 MG tablet Take 1 tablet (20 mg total) by mouth daily. (Patient taking differently: Take 20 mg by mouth 2 (two) times daily.) 30 tablet 3   lactose free nutrition (BOOST) LIQD Take 237 mLs by mouth 2 (two) times daily between meals. 14220 mL 2   mirabegron ER (MYRBETRIQ) 50 MG TB24 tablet Take 1 tablet (50 mg total) by mouth daily. 30 tablet 11   mirtazapine (REMERON) 30 MG tablet Take 30 mg by mouth at bedtime.     traZODone (DESYREL) 50 MG tablet Take 50 mg by mouth at bedtime.     vitamin B-12 (CYANOCOBALAMIN) 500 MCG tablet Take 500 mcg by mouth daily.     doxycycline (VIBRAMYCIN) 100 MG capsule Take 1  capsule (100 mg total) by mouth every 12 (twelve) hours. 14 capsule 0   Facility-Administered Medications Prior to Visit  Medication Dose Route Frequency Provider Last Rate Last Admin   0.9 %  sodium chloride infusion  500 mL Intravenous Once Milus Banister, MD         Objective:     BP 132/68   Pulse 84   Temp 98.2 F (36.8 C)   Ht '5\' 1"'$  (1.549 m)   SpO2 97% Comment: ra  BMI 27.59 kg/m   SpO2: 97 % (ra)  Elderly wf w/c bound nad/ R shoulder in sling a recent fall    HEENT : Oropharynx  clear         NECK :  without  apparent JVD/ palpable Nodes/TM    LUNGS: no acc muscle use,  Nl contour chest which is clear to A and P bilaterally without cough on insp or exp maneuvers   CV:  RRR  no s3 or murmur or increase in P2, and no edema   ABD:  soft and nontender    MS:  ext warm without deformities Or obvious joint restrictions  calf tenderness, cyanosis or clubbing    SKIN: warm and dry without lesions    NEURO:  alert, approp, nl sensorium with  no motor or cerebellar deficits apparent.   CXR PA and Lateral:   05/10/2022 :    I personally reviewed images and agree with radiology impression as follows:    Emphysematous changes without acute infiltrate.     Assessment   Dyspnea on exertion Stopped smoking 1970s s apparent sequelae - onset 2023  - Echo 07/24/53  Grade 1 diastolic dysfunction/ mild MR  - Try off acei 05/10/2022 due to assoc globus    When respiratory symptoms begin or become refractory well after a patient reports complete smoking cessation,  Especially when this wasn't the case while they were smoking, a red flag is raised based on the work of Dr Kris Mouton which states:  if you quit smoking when your best day FEV1 is still well preserved it is highly unlikely you will progress to severe disease.  That is to say, once the smoking stops,  the symptoms should not suddenly erupt or markedly worsen.  If so, the differential diagnosis should include   obesity/deconditioning,  LPR/Reflux/Aspiration syndromes,  occult CHF(excluded by echo above) , or  especially side effect of medications commonly used in this population, esp ACEi (see separate a/p)   Rec try off acei and see if globus/ perceived need for saba resolve and if not return for pfts   Essential hypertension D/c vasotec and replace with Valsartan 40 mg one daily  In the best review of chronic cough to date ( NEJM 2016 375 [id number]) ,  ACEi are now felt to cause cough in up to  20% of pts which is a 4 fold increase from previous reports and does not include the variety of non-specific complaints we see in pulmonary clinic in pts on ACEi but previously attributed to another dx like  Copd/asthma and  include PNDS, throat   congestion= globus , "bronchitis", unexplained dyspnea and noct "strangling" sensations, and hoarseness, but also  atypical /refractory GERD symptoms like dysphagia and "bad heartburn" / choking on food  The only way I know  to prove this is not an "ACEi Case" is a trial off ACEi x a minimum of 6 weeks then regroup.   Try valsartan 40 mg titrate to adequate bp and f/u with PCP if better and if not then return here for full pfts rather than emprical bronchodilator trials given the challenge in a blind pt trying to achieve optimal technique and med reconciliation  Discussed in detail all the  indications, usual  risks and alternatives  relative to the benefits with patient who agrees to proceed with Rx as outlined.             Each maintenance medication was reviewed in detail including emphasizing most importantly the difference between maintenance and prns and under what circumstances the prns are to be triggered using an action plan format where appropriate.  Total time for H and P, chart review, counseling, r and generating customized AVS unique to this office visit / same day charting = 40 min           Christinia Gully, MD 05/10/2022

## 2022-09-03 ENCOUNTER — Ambulatory Visit (INDEPENDENT_AMBULATORY_CARE_PROVIDER_SITE_OTHER): Payer: 59 | Admitting: Urology

## 2022-09-03 ENCOUNTER — Encounter: Payer: Self-pay | Admitting: Urology

## 2022-09-03 VITALS — BP 112/72 | HR 88

## 2022-09-03 DIAGNOSIS — N3000 Acute cystitis without hematuria: Secondary | ICD-10-CM | POA: Diagnosis not present

## 2022-09-03 DIAGNOSIS — R339 Retention of urine, unspecified: Secondary | ICD-10-CM | POA: Diagnosis not present

## 2022-09-03 DIAGNOSIS — R350 Frequency of micturition: Secondary | ICD-10-CM | POA: Diagnosis not present

## 2022-09-03 DIAGNOSIS — N3941 Urge incontinence: Secondary | ICD-10-CM

## 2022-09-03 LAB — BLADDER SCAN AMB NON-IMAGING: Scan Result: 91

## 2022-09-03 MED ORDER — MIRABEGRON ER 50 MG PO TB24: 50.0000 mg | ORAL_TABLET | Freq: Every day | ORAL | 11 refills | Status: DC

## 2022-09-03 NOTE — Patient Instructions (Signed)
Urinary Tract Infection, Adult  A urinary tract infection (UTI) is an infection of any part of the urinary tract. The urinary tract includes the kidneys, ureters, bladder, and urethra. These organs make, store, and get rid of urine in the body. An upper UTI affects the ureters and kidneys. A lower UTI affects the bladder and urethra. What are the causes? Most urinary tract infections are caused by bacteria in your genital area around your urethra, where urine leaves your body. These bacteria grow and cause inflammation of your urinary tract. What increases the risk? You are more likely to develop this condition if: You have a urinary catheter that stays in place. You are not able to control when you urinate or have a bowel movement (incontinence). You are female and you: Use a spermicide or diaphragm for birth control. Have low estrogen levels. Are pregnant. You have certain genes that increase your risk. You are sexually active. You take antibiotic medicines. You have a condition that causes your flow of urine to slow down, such as: An enlarged prostate, if you are female. Blockage in your urethra. A kidney stone. A nerve condition that affects your bladder control (neurogenic bladder). Not getting enough to drink, or not urinating often. You have certain medical conditions, such as: Diabetes. A weak disease-fighting system (immunesystem). Sickle cell disease. Gout. Spinal cord injury. What are the signs or symptoms? Symptoms of this condition include: Needing to urinate right away (urgency). Frequent urination. This may include small amounts of urine each time you urinate. Pain or burning with urination. Blood in the urine. Urine that smells bad or unusual. Trouble urinating. Cloudy urine. Vaginal discharge, if you are female. Pain in the abdomen or the lower back. You may also have: Vomiting or a decreased appetite. Confusion. Irritability or tiredness. A fever or  chills. Diarrhea. The first symptom in older adults may be confusion. In some cases, they may not have any symptoms until the infection has worsened. How is this diagnosed? This condition is diagnosed based on your medical history and a physical exam. You may also have other tests, including: Urine tests. Blood tests. Tests for STIs (sexually transmitted infections). If you have had more than one UTI, a cystoscopy or imaging studies may be done to determine the cause of the infections. How is this treated? Treatment for this condition includes: Antibiotic medicine. Over-the-counter medicines to treat discomfort. Drinking enough water to stay hydrated. If you have frequent infections or have other conditions such as a kidney stone, you may need to see a health care provider who specializes in the urinary tract (urologist). In rare cases, urinary tract infections can cause sepsis. Sepsis is a life-threatening condition that occurs when the body responds to an infection. Sepsis is treated in the hospital with IV antibiotics, fluids, and other medicines. Follow these instructions at home:  Medicines Take over-the-counter and prescription medicines only as told by your health care provider. If you were prescribed an antibiotic medicine, take it as told by your health care provider. Do not stop using the antibiotic even if you start to feel better. General instructions Make sure you: Empty your bladder often and completely. Do not hold urine for long periods of time. Empty your bladder after sex. Wipe from front to back after urinating or having a bowel movement if you are female. Use each tissue only one time when you wipe. Drink enough fluid to keep your urine pale yellow. Keep all follow-up visits. This is important. Contact a health   care provider if: Your symptoms do not get better after 1-2 days. Your symptoms go away and then return. Get help right away if: You have severe pain in  your back or your lower abdomen. You have a fever or chills. You have nausea or vomiting. Summary A urinary tract infection (UTI) is an infection of any part of the urinary tract, which includes the kidneys, ureters, bladder, and urethra. Most urinary tract infections are caused by bacteria in your genital area. Treatment for this condition often includes antibiotic medicines. If you were prescribed an antibiotic medicine, take it as told by your health care provider. Do not stop using the antibiotic even if you start to feel better. Keep all follow-up visits. This is important. This information is not intended to replace advice given to you by your health care provider. Make sure you discuss any questions you have with your health care provider. Document Revised: 01/18/2020 Document Reviewed: 01/18/2020 Elsevier Patient Education  2023 Elsevier Inc.  

## 2022-09-03 NOTE — Progress Notes (Signed)
post void residual=91 

## 2022-09-03 NOTE — Progress Notes (Signed)
09/03/2022 11:06 AM   Anna Jacobs 10/18/1940 [id number]  Referring provider: Monico Blitz, MD [address] [place],  [place] [address]  Followup urinary urgency   HPI: Anna Jacobs is a 82yo here for followup for OAB and UTI. She has had 2 UTIs in the past month. Both were e coli. She was started on antibiotics yesterday. We are awaiting urine culture results.She is currently on doxycycline. She has stable LUTS on mirabegron 50mg  daily. She notes increased urinary frequency and urgency since the UTi started. No other complaints today     PMH: Past Medical History:  Diagnosis Date   Allergy    Anxiety    B12 deficiency    Degenerative joint disease    Depression    GERD (gastroesophageal reflux disease)    EGD 07/02/2003 Dr. Gala Romney   Hearing loss    Hyperlipemia    Hypertension    IBS (irritable bowel syndrome)    Colonoscopy normal Dr. Gala Romney 06/2003   Insomnia    Parkinson's disease 2001   Peripheral neuropathy    Restless leg syndrome    RP (retinitis pigmentosa)    Sleep apnea    TIA (transient ischemic attack)    Urinary incontinence     Surgical History: Past Surgical History:  Procedure Laterality Date   Benign breast biopsy     left (remotely) and right (06/2012)   BIOPSY N/A 11/29/2012   Procedure: BIOPSY;  Surgeon: Daneil Dolin, MD;  Location: [facility];  Service: Endoscopy;  Laterality: N/A;  RANDOM BIOPSY   CHOLECYSTECTOMY  2000   COLONOSCOPY N/A 11/29/2012   [id number] rectal polyps-removed Otherwise normal examination distal terminal ileum appeared normal. Random colon biopsies were negative for microscopic colitis. Rectum polyp, tubular adenoma. Next TCS 11/2019.   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 If you have any questions, ask your nurse or doctor.          aspirin 81 MG chewable tablet Chew by mouth daily.   atorvastatin 40 MG tablet Commonly known as: LIPITOR Take 40 mg by mouth daily.   docusate sodium 100 MG capsule Commonly known as: Colace Take 1 capsule (100 mg total) by mouth 2 (two) times daily. Take as needed for hard stools   DULoxetine 60 MG capsule Commonly known as: CYMBALTA Take 60 mg by mouth daily.   escitalopram 20 MG tablet Commonly known as: LEXAPRO Take 20 mg by mouth daily.   famotidine 20 MG tablet Commonly known as: Pepcid Take 1 tablet (20 mg total) by mouth daily. What changed: when to take this   lactose free nutrition Liqd Take 237 mLs by mouth 2 (two) times daily between meals.   mirabegron ER 50 MG Tb24 tablet Commonly known as: Myrbetriq Take 1 tablet (50 mg total) by mouth daily.   mirtazapine 30 MG tablet Commonly known as: REMERON Take 30 mg by mouth at bedtime.   traZODone 50 MG tablet Commonly known as: DESYREL Take 50 mg by mouth at  bedtime.   valsartan 40 MG tablet Commonly known as: Diovan Take 1 tablet (40 mg total) by mouth daily.   vitamin B-12 500 MCG tablet Commonly known as: CYANOCOBALAMIN Take 500 mcg by mouth daily.   Vitamin D 50 MCG (2000 UT) tablet Take 2,000 Units by mouth daily.        Allergies:  Allergies  Allergen Reactions   Metronidazole    Flagyl [Metronidazole Hcl] Itching and Rash    Family History: Family History  Problem Relation Age of Onset   Lymphoma Mother    Coronary artery disease Father    Esophageal cancer Brother    Lung cancer Brother    Ovarian cancer Daughter 77   Colon cancer Neg Hx    Rectal cancer Neg Hx    Stomach cancer Neg Hx    Parkinson's disease Neg Hx     Social History:  reports that she has quit  smoking. She has never used smokeless tobacco. She reports that she does not drink alcohol and does not use drugs.  ROS: All other review of systems were reviewed and are negative except what is noted above in HPI  Physical Exam: BP 112/72   Pulse 88   Constitutional:  Alert and oriented, No acute distress. HEENT: [place] AT, moist mucus membranes.  Trachea midline, no masses. Cardiovascular: No clubbing, cyanosis, or edema. Respiratory: Normal respiratory effort, no increased work of breathing. GI: Abdomen is soft, nontender, nondistended, no abdominal masses GU: No CVA tenderness.  Lymph: No cervical or inguinal lymphadenopathy. Skin: No rashes, bruises or suspicious lesions. Neurologic: Grossly intact, no focal deficits, moving all 4 extremities. Psychiatric: Normal mood and affect.  Laboratory Data: Lab Results  Component Value Date   WBC 5.4 06/24/2020   HGB 14.0 06/24/2020   HCT 43.3 06/24/2020   MCV 92.7 06/24/2020   PLT 333 06/24/2020    Lab Results  Component Value Date   CREATININE 1.01 (H) 08/18/2021    No results found for: "PSA"  No results found for: "TESTOSTERONE"  No results found for: "HGBA1C"  Urinalysis    Component Value Date/Time   APPEARANCEUR Clear 02/15/2022 1309   GLUCOSEU Negative 02/15/2022 1309   BILIRUBINUR Negative 02/15/2022 1309   PROTEINUR Negative 02/15/2022 1309   NITRITE Negative 02/15/2022 1309   LEUKOCYTESUR Negative 02/15/2022 1309    Lab Results  Component Value Date   LABMICR See below: 02/15/2022   WBCUA 0-5 02/15/2022   LABEPIT 0-10 02/15/2022   BACTERIA Few 02/15/2022    Pertinent Imaging:  No results found for this or any previous visit.  No results found for this or any previous visit.  No results found for this or any previous visit.  No results found for this or any previous visit.  No results found for this or any previous visit.  No valid procedures specified. No results found for this or any previous  visit.  No results found for this or any previous visit.   Assessment & Plan:    1. Incomplete emptying of bladder -resolved - BLADDER SCAN AMB NON-IMAGING  2. Urine frequency -continue mirabegron 50mg   3. Acute cystitis without hematuria -continue doxycycline 100mg  BID    No follow-ups on file.  Nicolette Bang, MD  [facility] [place]

## 2022-11-03 IMAGING — NM NM DATSCAN
3 series · 28 of 35 positions shown · non-contrast
Comparison: None.

CLINICAL DATA: 80-year-old female with bilateral hand tremors.
Auditory hallucination.

EXAM:
NUCLEAR MEDICINE BRAIN IMAGING WITH SPECT  (DaTscan )
TECHNIQUE: SPECT images of the brain were obtained after intravenous injection
of radiopharmaceutical. 4 hour post injection imaging. Appropriate
positioning.
130 mg ZOOM STAT given orally for thyroid blockade.
RADIOPHARMACEUTICALS:  0.8 millicuries I 123 Ioflupane

[Series 1: dat scan · 4.14mm/px · 5 of 120 frames shown]
[frame 11/120  full-range]
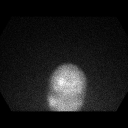
[frame 31/120  full-range]
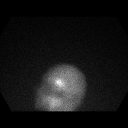
[frame 71/120  full-range]
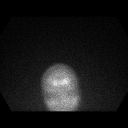
[frame 91/120  full-range]
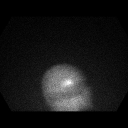
[frame 111/120  full-range]
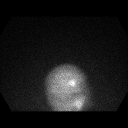

[Series 7: datquant results · 4.1mm · 4.14mm/px · 5 of 128 frames shown]
[frame 11/128]
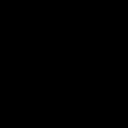
[frame 54/128]
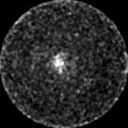
[frame 75/128]
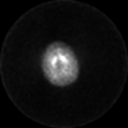
[frame 96/128]
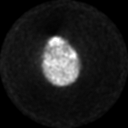
[frame 118/128]
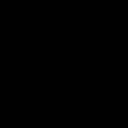

[Series 1014: mpr (id) range · 0.50mm/px · 18 of 33 slices shown]
[im 2/33]
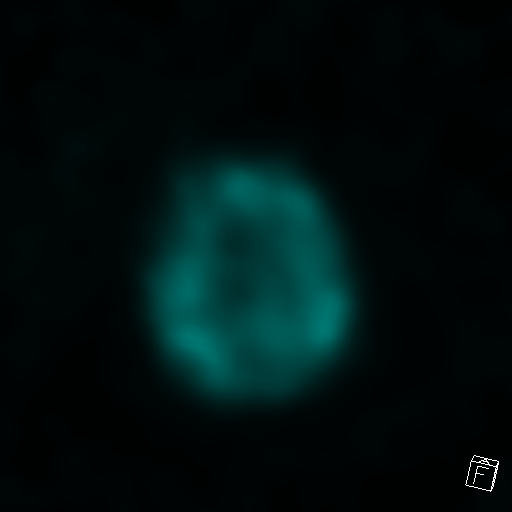
[im 3/33]
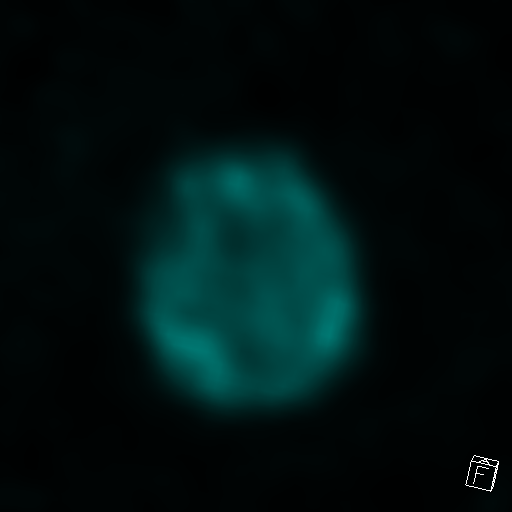
[im 5/33]
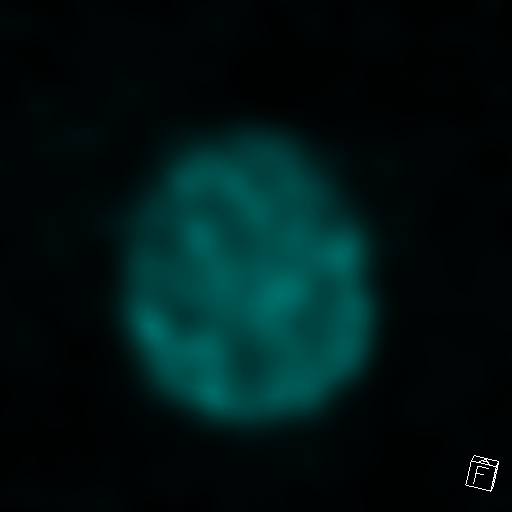
[im 6/33]
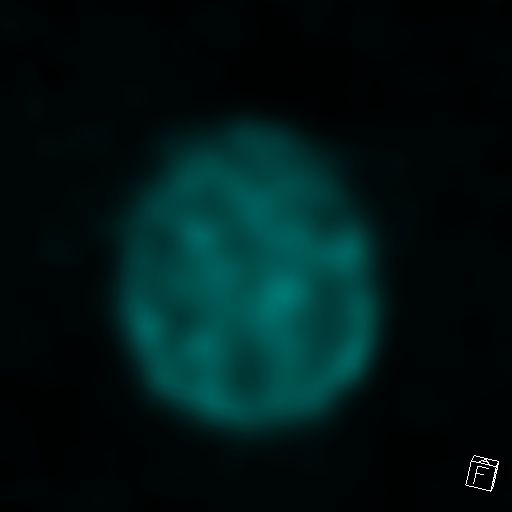
[im 9/33]
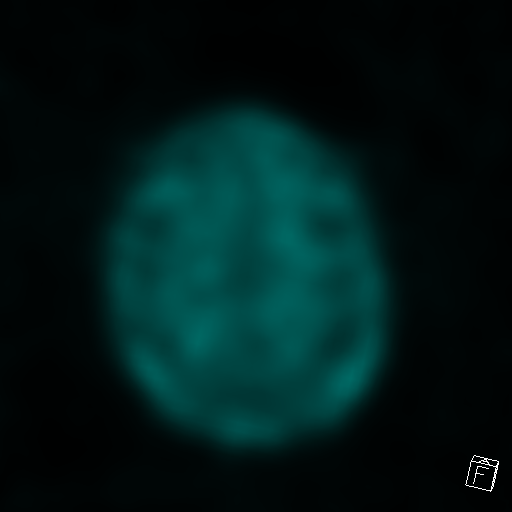
[im 11/33]
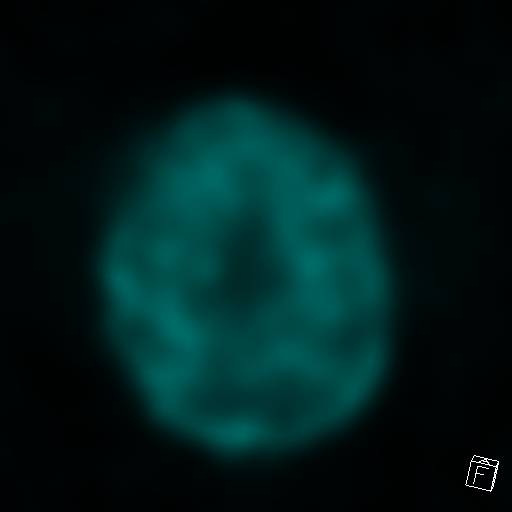
[im 12/33]
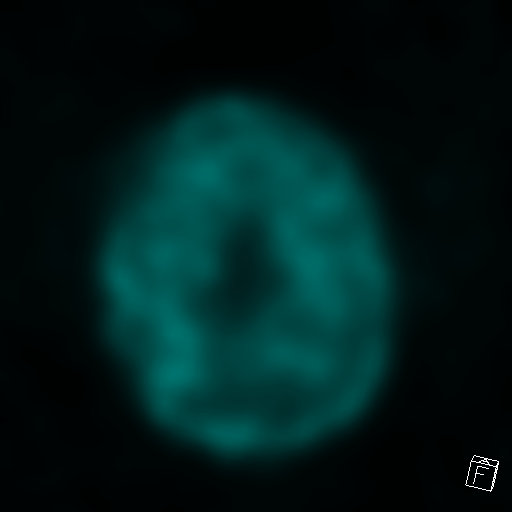
[im 14/33]
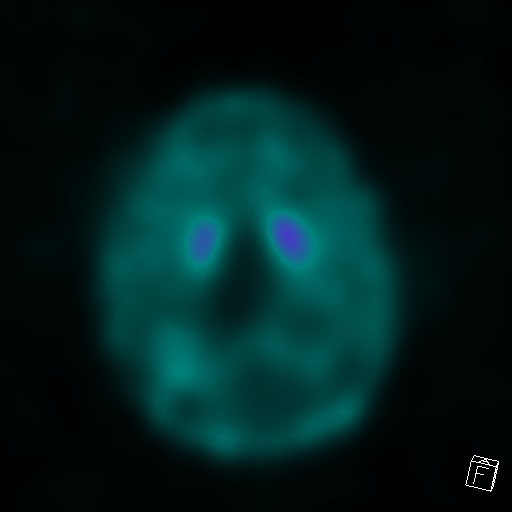
[im 17/33]
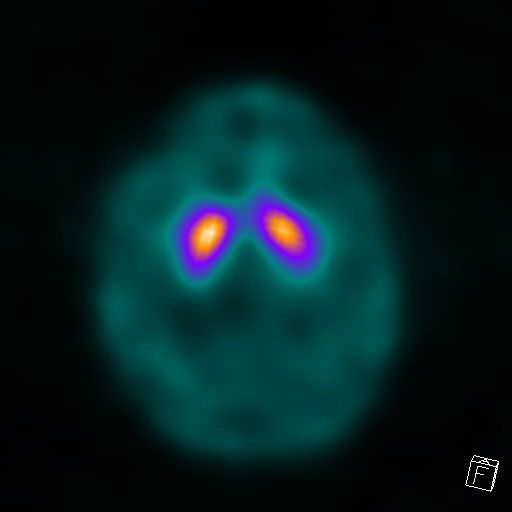
[im 18/33]
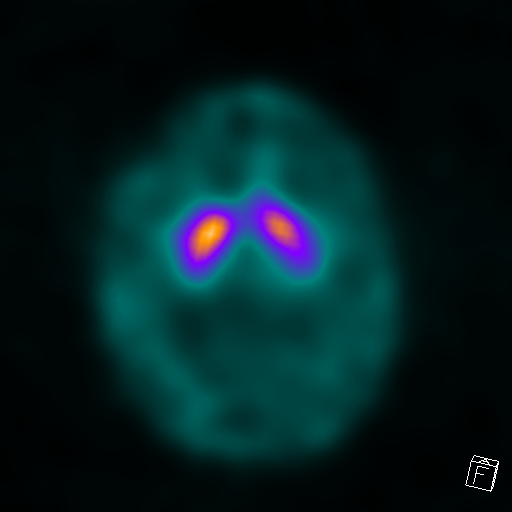
[im 19/33]
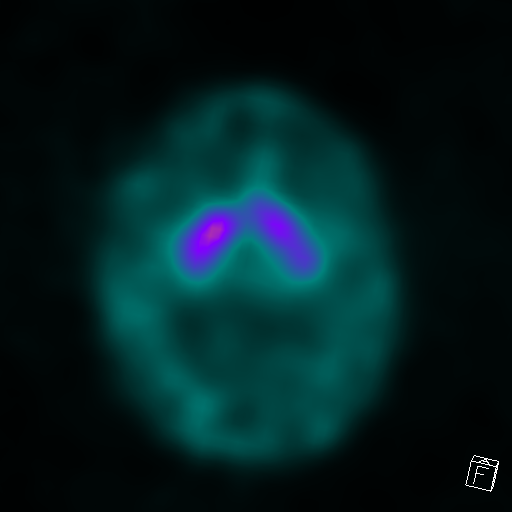
[im 21/33]
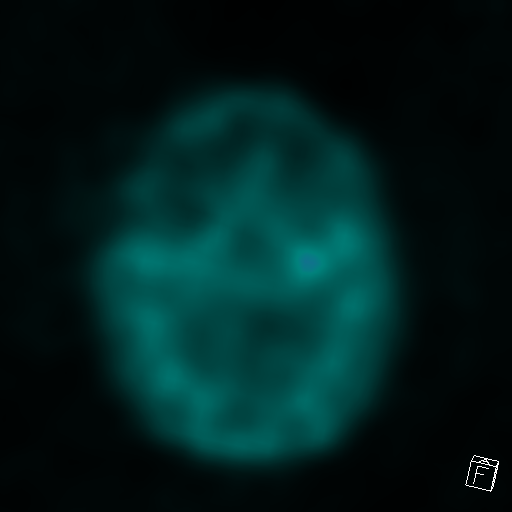
[im 24/33]
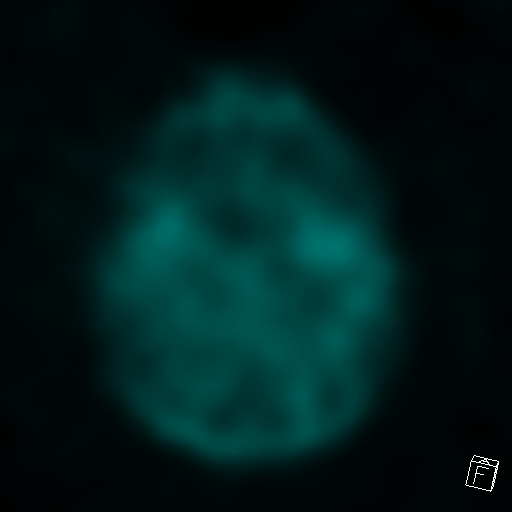
[im 25/33]
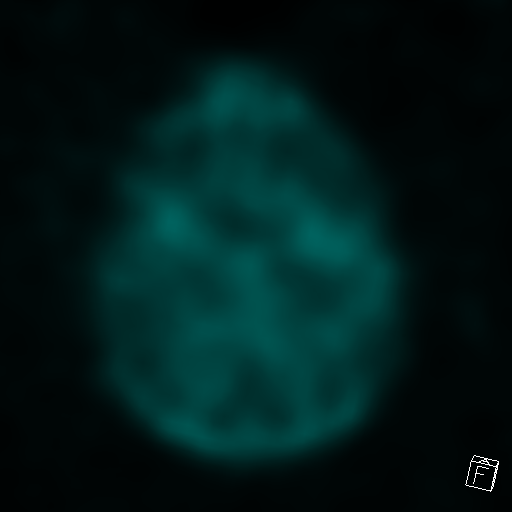
[im 27/33]
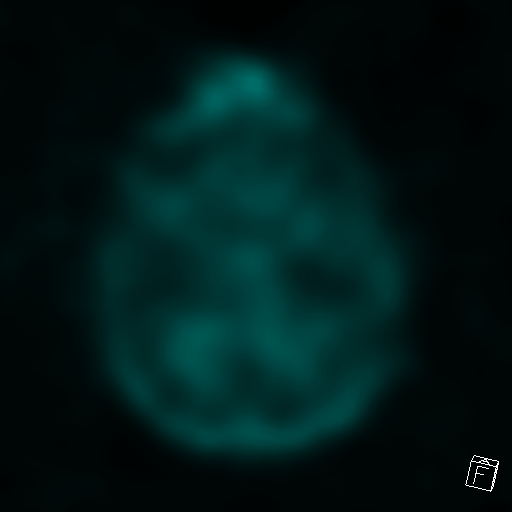
[im 28/33]
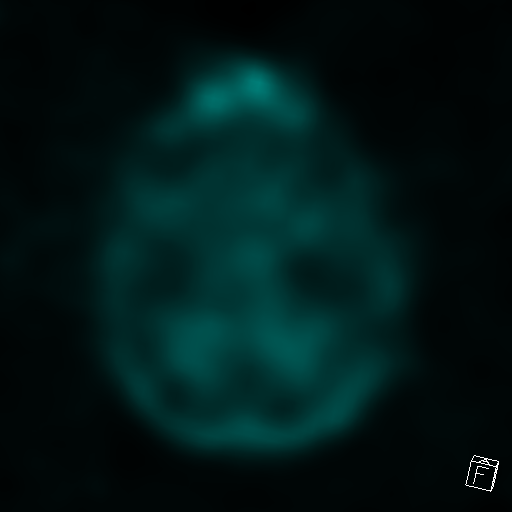
[im 31/33]
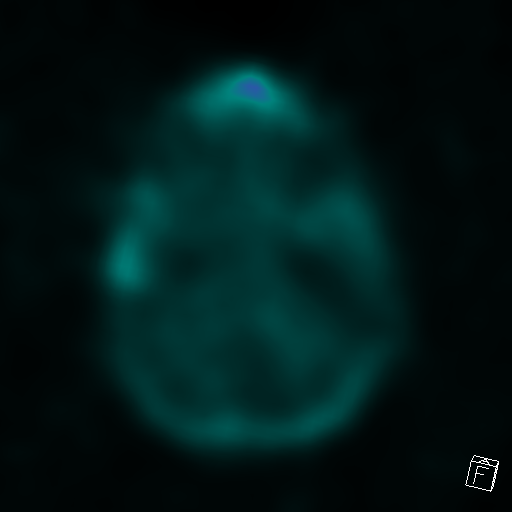
[im 33/33]
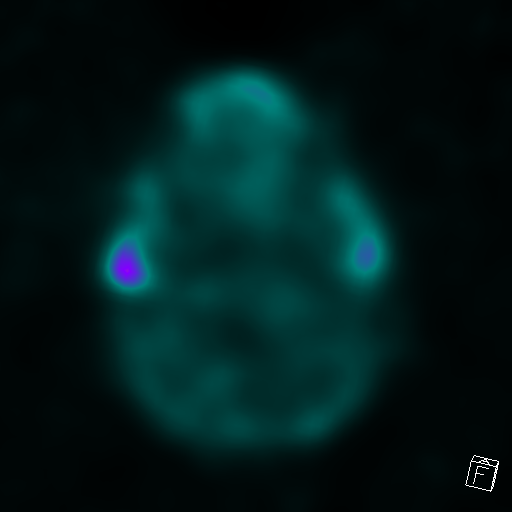

[28 of 35 positions shown; findings below may reference images not displayed]

FINDINGS: Symmetric intense uptake within LEFT and RIGHT striata. The heads of
the caudate nuclei and the posterior striata (putamen) are normal
shape. No evidence of loss of dopamine transport populations in the
basal ganglia.
IMPRESSION: Normal Ioflupane scan. No reduced radiotracer activity in basal
ganglia to suggest Parkinson's syndrome pathology.

Of note, DaTSCAN is not diagnostic of Parkinsonian syndromes, which
remains a clinical diagnosis. DaTscan is an adjuvant test to aid in
the clinical diagnosis of Parkinsonian syndromes.

## 2022-12-16 ENCOUNTER — Telehealth: Payer: Self-pay | Admitting: Neurology

## 2022-12-16 NOTE — Telephone Encounter (Signed)
Pt son called. Stated pt needs to be seen sooner because she is seeing people and having a conversation with them. Stated these people are telling her to go outside at 3 am in the morning. He is requesting a call back from nurse.

## 2022-12-16 NOTE — Telephone Encounter (Signed)
Pt saw Dr Frances Furbish on 08/18/21

## 2022-12-16 NOTE — Telephone Encounter (Signed)
I tried to call the patient's daughter's (on DPR) first but Amy's number was out of service and Victoria's phone rang multiple times with no VM. I called the pt's son back. I let him know that since he is not on her DPR I cannot provide any information but given the information he provided, I advised that patient be scheduled an appointment here to discuss further and also see primary care in the meantime and certainly emergency department if she is any immediate danger.  He verbalized understanding and we scheduled an appointment with Dr. Frances Furbish for 03/14/23 (next available after I offered a 9/19 appt).  The patient's son said he has been staying with her for the last month. At night after she says her prayers, she starts having conversations with the voices that are telling her to go outside. The patient's son said she saw primary care, Dr. Mayford Knife, the day before yesterday.  She was prescribed Zoloft 25 mg daily (was on this years ago) and she was told by Dr. Mayford Knife that she needed to see a psychiatrist but he said she would have to see neurology for this and he would not send a referral.  I put her on our wait list.

## 2022-12-21 NOTE — Telephone Encounter (Signed)
We have received the office visit from Dr Norris Cross office. I have placed it in Dr Teofilo Pod office for review.

## 2022-12-21 NOTE — Telephone Encounter (Signed)
I called the patient's primary care office.  They will fax over the recent office visit for our review.  The staff member said she did not see any mention of psychiatry in the office note but that the recommendation to see neurology was sparked because of worsened memory.  She said she would send a note seeking clarification to Dr. Mayford Knife just to be sure.     do have the patient on Dr Teofilo Pod wait list.

## 2023-03-04 ENCOUNTER — Ambulatory Visit: Payer: 59 | Admitting: Urology

## 2023-03-14 ENCOUNTER — Ambulatory Visit: Payer: 59 | Admitting: Neurology

## 2023-04-06 ENCOUNTER — Ambulatory Visit: Payer: 59 | Admitting: Urology

## 2023-04-06 ENCOUNTER — Encounter: Payer: Self-pay | Admitting: Urology

## 2023-04-06 VITALS — BP 146/75 | HR 73

## 2023-04-06 DIAGNOSIS — R82998 Other abnormal findings in urine: Secondary | ICD-10-CM | POA: Diagnosis not present

## 2023-04-06 DIAGNOSIS — N3941 Urge incontinence: Secondary | ICD-10-CM | POA: Diagnosis not present

## 2023-04-06 DIAGNOSIS — R350 Frequency of micturition: Secondary | ICD-10-CM

## 2023-04-06 DIAGNOSIS — Z87440 Personal history of urinary (tract) infections: Secondary | ICD-10-CM | POA: Diagnosis not present

## 2023-04-06 DIAGNOSIS — R8271 Bacteriuria: Secondary | ICD-10-CM

## 2023-04-06 DIAGNOSIS — N3000 Acute cystitis without hematuria: Secondary | ICD-10-CM

## 2023-04-06 LAB — URINALYSIS, ROUTINE W REFLEX MICROSCOPIC
Bilirubin, UA: NEGATIVE
Glucose, UA: NEGATIVE
Ketones, UA: NEGATIVE
Nitrite, UA: POSITIVE — AB
Protein,UA: NEGATIVE
RBC, UA: NEGATIVE
Specific Gravity, UA: 1.02 (ref 1.005–1.030)
Urobilinogen, Ur: 0.2 mg/dL (ref 0.2–1.0)
pH, UA: 6 (ref 5.0–7.5)

## 2023-04-06 LAB — MICROSCOPIC EXAMINATION

## 2023-04-06 NOTE — Patient Instructions (Signed)

## 2023-04-06 NOTE — Progress Notes (Signed)
04/06/2023 3:11 PM   TRANISHA TISSUE February 28, 1941 [id number]  Referring provider: Donetta Potts, MD [address] [place],  [place] [address]  Followup recurrent UTI and OAB.    HPI: Anna Jacobs is a 82yo here for followup for recurrent UTI and OAB. No UTIs in the past 6 months. UA today shows WBC and bacteria. She denies any urinary urgency and urinary frequency. She has stopped mirabegron. She uses 1 pad per day. She has rare urge incontinence.    PMH: Past Medical History:  Diagnosis Date   Allergy    Anxiety    B12 deficiency    Degenerative joint disease    Depression    GERD (gastroesophageal reflux disease)    EGD 07/02/2003 Dr. Jena Gauss   Hearing loss    Hyperlipemia    Hypertension    IBS (irritable bowel syndrome)    Colonoscopy normal Dr. Jena Gauss 06/2003   Insomnia    Parkinson's disease 2001   Peripheral neuropathy    Restless leg syndrome    RP (retinitis pigmentosa)    Sleep apnea    TIA (transient ischemic attack)    Urinary incontinence     Surgical History: Past Surgical History:  Procedure Laterality Date   Benign breast biopsy     left (remotely) and right (06/2012)   BIOPSY N/A 11/29/2012   Procedure: BIOPSY;  Surgeon: Corbin Ade, MD;  [facility];  Service: Endoscopy;  Laterality: N/A;  RANDOM BIOPSY   CHOLECYSTECTOMY  2000   COLONOSCOPY N/A 11/29/2012   [id number] rectal polyps-removed Otherwise normal examination distal terminal ileum appeared normal. Random colon biopsies were negative for microscopic colitis. Rectum polyp, tubular adenoma. Next TCS 11/2019.   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 If you have any questions, ask your nurse or doctor.          aspirin 81 MG chewable tablet Chew by mouth daily.   atorvastatin 40 MG tablet Commonly known as: LIPITOR Take 40 mg by mouth daily.   docusate sodium 100 MG capsule Commonly known as: Colace Take 1 capsule (100 mg total) by mouth 2 (two) times daily. Take as needed for hard stools   DULoxetine 60 MG capsule Commonly known as: CYMBALTA Take 60 mg by mouth daily.   escitalopram 20 MG tablet Commonly known as: LEXAPRO Take 20 mg by mouth daily.   famotidine 20 MG tablet Commonly known as: Pepcid Take 1 tablet (20 mg total) by mouth daily. What changed: when to take this   lactose free nutrition Liqd Take 237 mLs by mouth 2 (two) times daily between meals.   mirabegron ER 50 MG Tb24 tablet Commonly known as: Myrbetriq Take 1 tablet (50 mg total) by mouth daily.   mirtazapine 30 MG tablet Commonly known as: REMERON Take 30 mg by mouth at bedtime.   traZODone 50 MG tablet Commonly known as: DESYREL Take 50 mg by mouth at bedtime.   valsartan 40 MG tablet Commonly known as:  Diovan Take 1 tablet (40 mg total) by mouth daily.   vitamin B-12 500 MCG tablet Commonly known as: CYANOCOBALAMIN Take 500 mcg by mouth daily.   Vitamin D 50 MCG (2000 UT) tablet Take 2,000 Units by mouth daily.        Allergies:  Allergies  Allergen Reactions   Metronidazole    Flagyl [Metronidazole Hcl] Itching and Rash    Family History: Family History  Problem Relation Age of Onset   Lymphoma Mother    Coronary artery disease Father    Esophageal cancer Brother    Lung cancer Brother    Ovarian cancer Daughter 64   Colon cancer Neg Hx    Rectal cancer Neg Hx    Stomach cancer Neg Hx    Parkinson's disease Neg Hx     Social History:  reports that she has quit smoking. She has never used smokeless tobacco. She reports that she does not  drink alcohol and does not use drugs.  ROS: All other review of systems were reviewed and are negative except what is noted above in HPI  Physical Exam: BP (!) 146/75   Pulse 73   Constitutional:  Alert and oriented, No acute distress. HEENT: [place] AT, moist mucus membranes.  Trachea midline, no masses. Cardiovascular: No clubbing, cyanosis, or edema. Respiratory: Normal respiratory effort, no increased work of breathing. GI: Abdomen is soft, nontender, nondistended, no abdominal masses GU: No CVA tenderness.  Lymph: No cervical or inguinal lymphadenopathy. Skin: No rashes, bruises or suspicious lesions. Neurologic: Grossly intact, no focal deficits, moving all 4 extremities. Psychiatric: Normal mood and affect.  Laboratory Data: Lab Results  Component Value Date   WBC 5.4 06/24/2020   HGB 14.0 06/24/2020   HCT 43.3 06/24/2020   MCV 92.7 06/24/2020   PLT 333 06/24/2020    Lab Results  Component Value Date   CREATININE 1.01 (H) 08/18/2021    No results found for: "PSA"  No results found for: "TESTOSTERONE"  No results found for: "HGBA1C"  Urinalysis    Component Value Date/Time   APPEARANCEUR Clear 02/15/2022 1309   GLUCOSEU Negative 02/15/2022 1309   BILIRUBINUR Negative 02/15/2022 1309   PROTEINUR Negative 02/15/2022 1309   NITRITE Negative 02/15/2022 1309   LEUKOCYTESUR Negative 02/15/2022 1309    Lab Results  Component Value Date   LABMICR See below: 02/15/2022   WBCUA 0-5 02/15/2022   LABEPIT 0-10 02/15/2022   BACTERIA Few 02/15/2022    Pertinent Imaging:  No results found for this or any previous visit.  No results found for this or any previous visit.  No results found for this or any previous visit.  No results found for this or any previous visit.  No results found for this or any previous visit.  No valid procedures specified. No results found for this or any previous visit.  No results found for this or any previous  visit.   Assessment & Plan:    1. Urine frequency -patient defers therapy at this time - Urinalysis, Routine w reflex microscopic  2. Acute cystitis without hematuria -urine for culure, will call with results - Urinalysis, Routine w reflex microscopic   No follow-ups on file.  Wilkie Aye, MD  [facility] [place]

## 2023-04-11 LAB — URINE CULTURE

## 2023-04-14 ENCOUNTER — Telehealth: Payer: Self-pay

## 2023-04-14 MED ORDER — NITROFURANTOIN MONOHYD MACRO 100 MG PO CAPS: 100.0000 mg | ORAL_CAPSULE | Freq: Two times a day (BID) | ORAL | 0 refills | Status: DC

## 2023-04-14 NOTE — Telephone Encounter (Signed)
-----   Message from Wilkie Aye sent at 04/14/2023  2:19 PM EDT ----- Macrobid 100mg  BID for 7 days ----- Message ----- From: Gustavus Messing, LPN Sent: 16/03/9603  11:51 AM EDT To: Malen Gauze, MD  No txt started

## 2023-04-14 NOTE — Telephone Encounter (Signed)
Daughter made aware of positive urine culture and abt sent to pharmacy.

## 2023-08-04 ENCOUNTER — Encounter: Payer: Self-pay | Admitting: Urology

## 2023-08-07 ENCOUNTER — Emergency Department (HOSPITAL_COMMUNITY): Payer: 59

## 2023-08-07 ENCOUNTER — Encounter (HOSPITAL_COMMUNITY): Payer: Self-pay | Admitting: Emergency Medicine

## 2023-08-07 ENCOUNTER — Other Ambulatory Visit: Payer: Self-pay

## 2023-08-07 ENCOUNTER — Emergency Department (HOSPITAL_COMMUNITY)
Admission: EM | Admit: 2023-08-07 | Discharge: 2023-08-07 | Disposition: A | Discharge status: Home / Self Care | Payer: 59 | Attending: Emergency Medicine | Admitting: Emergency Medicine

## 2023-08-07 DIAGNOSIS — W19XXXA Unspecified fall, initial encounter: Secondary | ICD-10-CM | POA: Insufficient documentation

## 2023-08-07 DIAGNOSIS — G20A1 Parkinson's disease without dyskinesia, without mention of fluctuations: Secondary | ICD-10-CM | POA: Insufficient documentation

## 2023-08-07 DIAGNOSIS — S0990XA Unspecified injury of head, initial encounter: Secondary | ICD-10-CM | POA: Insufficient documentation

## 2023-08-07 DIAGNOSIS — Z96651 Presence of right artificial knee joint: Secondary | ICD-10-CM | POA: Insufficient documentation

## 2023-08-07 DIAGNOSIS — F0390 Unspecified dementia without behavioral disturbance: Secondary | ICD-10-CM | POA: Diagnosis not present

## 2023-08-07 DIAGNOSIS — I10 Essential (primary) hypertension: Secondary | ICD-10-CM | POA: Insufficient documentation

## 2023-08-07 NOTE — ED Notes (Signed)
 Patient transported to CT

## 2023-08-07 NOTE — ED Triage Notes (Signed)
Pt presents to the ED via [facility] from home for a witnessed ground level fall. 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 Pt currently in a c-collar applied by EMS. Of note, the patient has a hx of dementia and is unsure why she has come to the hospital and has no complaints at this present time. Unsure of blood thinner use - has baby ASA on her med rec. A&Ox3 at this time. Denies headache, CP or SOB.

## 2023-08-07 NOTE — ED Provider Notes (Signed)
[facility] Emergency Department Provider Note MRN:  [id number]  Arrival date & time: 08/07/23     Chief Complaint   Fall   History of Present Illness   Anna Jacobs is a 83 y.o. year-old female with a history of dementia presenting to the ED with chief complaint of fall.  Witnessed fall, possible head trauma.  Patient denies pain currently.  Review of Systems  I was unable to obtain an accurate HPI, PMH, or ROS due to the patient's dementia.   Patient's Health History    Past Medical History:  Diagnosis Date   Allergy    Anxiety    B12 deficiency    Degenerative joint disease    Depression    GERD (gastroesophageal reflux disease)    EGD 07/02/2003 Dr. Jena Gauss   Hearing loss    Hyperlipemia    Hypertension    IBS (irritable bowel syndrome)    Colonoscopy normal Dr. Jena Gauss 06/2003   Insomnia    Parkinson's disease 2001   Peripheral neuropathy    Restless leg syndrome    RP (retinitis pigmentosa)    Sleep apnea    TIA (transient ischemic attack)    Urinary incontinence     Past Surgical History:  Procedure Laterality Date   Benign breast biopsy     left (remotely) and right (06/2012)   BIOPSY N/A 11/29/2012   Procedure: BIOPSY;  Surgeon: Corbin Ade, MD;  [facility];  Service: Endoscopy;  Laterality: N/A;  RANDOM BIOPSY   CHOLECYSTECTOMY  2000   COLONOSCOPY N/A 11/29/2012   [id number] rectal polyps-removed Otherwise normal examination distal terminal ileum appeared normal. Random colon biopsies were negative for microscopic colitis. Rectum polyp, tubular adenoma. Next TCS 11/2019.   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   Procedure: TOTAL KNEE ARTHROPLASTY;  Surgeon: Frederico Hamman, MD;  Location: [facility];  Service: Orthopedics;  Laterality: Right;    Family History  Problem Relation Age of Onset   Lymphoma Mother    Coronary artery disease Father    Esophageal  cancer Brother    Lung cancer Brother    Ovarian cancer Daughter 48   Colon cancer Neg Hx    Rectal cancer Neg Hx    Stomach cancer Neg Hx    Parkinson's disease Neg Hx     Social History   Socioeconomic History   Marital status: Divorced    Spouse name: Not on file   Number of children: 3   Years of education: Not on file   Highest education level: Not on file  Occupational History   Occupation: disabled    Comment: Previously worked at [employer]  Tobacco Use   Smoking status: Former   Smokeless tobacco: Never  Advertising account planner   Vaping status: Never Used  Substance and Sexual Activity   Alcohol use: No   Drug use: No   Sexual activity: Not Currently  Other Topics Concern   Not on file  Social History Narrative   Not on file   Social Drivers of Health   Financial Resource Strain: Not on file  Food Insecurity: Not on file  Transportation Needs: No Transportation Needs (03/10/2022)   PRAPARE - Administrator, Civil Service (Medical): No    Lack of Transportation (Non-Medical): No  Physical Activity: Not on file  Stress: Not on file  Social Connections: Not on file  Intimate Partner Violence:  Not on file     Physical Exam   Vitals:   08/07/23 0600 08/07/23 0715  BP: (!) 147/76 133/80  Pulse: 68 68  Resp: 17 (!) 25  Temp:    SpO2: 98% 100%    CONSTITUTIONAL:  well-appearing, NAD NEURO/PSYCH:  awake and alert, oriented to name, moves all extremities EYES:  eyes equal and reactive ENT/NECK:  no LAD, no JVD CARDIO:  regular rate, well-perfused, normal S1 and S2 PULM:  CTAB no wheezing or rhonchi GI/GU:  non-distended, non-tender MSK/SPINE:  No gross deformities, no edema SKIN:  no rash, atraumatic   *Additional and/or pertinent findings included in MDM below  Diagnostic and Interventional Summary    EKG Interpretation Date/Time:  Sunday August 07 2023 05:55:44 EST Ventricular Rate:  74 PR Interval:    QRS Duration:  99 QT Interval:  392 QTC  Calculation: 441 R Axis:   62  Text Interpretation: Accelerated junctional rhythm Anteroseptal infarct, age indeterminate Lateral leads are also involved Artifact in lead(s) I II III aVR aVL aVF V1 V2 Confirmed by Kennis Carina [phone]) on 08/07/2023 6:49:56 AM       Labs Reviewed - No data to display  CT HEAD WO CONTRAST ( )  Final Result    CT Cervical Spine Wo Contrast  Final Result      Medications - No data to display   Procedures  /  Critical Care Procedures  ED Course and Medical Decision Making  Initial Impression and Ddx Ddx includes intracranial bleeding, cervical spinal fracture.  No T or L spine tendereness, moves all extremities without pain, no chest tenderness, clear and present lung sounds, soft abdomen.  Doubt significant traumatic injury.  Past medical/surgical history that increases complexity of ED encounter: Dementia  Interpretation of Diagnostics CT head and cervical spine are without acute traumatic injury  Patient Reassessment and Ultimate Disposition/Management     Appropriate for discharge.  Tried reaching out to patient's daughter/emergency contact, no answer.  Patient management required discussion with the following services or consulting groups:  None  Complexity of Problems Addressed Acute illness or injury that poses threat of life of bodily function  Additional Data Reviewed and Analyzed Further history obtained from: EMS on arrival  Additional Factors Impacting ED Encounter Risk None  Elmer Sow. Pilar Plate, MD [facility] Emergency Medicine [facility] [email or web address]  Final Clinical Impressions(s) / ED Diagnoses     ICD-10-CM   1. Fall, initial encounter  W19.[facility]       ED Discharge Orders     None        Discharge Instructions Discussed with and Provided to Patient:     Discharge Instructions      You were evaluated in the Emergency Department and after careful evaluation, we did not find any  emergent condition requiring admission or further testing in the hospital.  Your exam/testing today is overall reassuring.  Imaging reveals no significant injuries.  Please return to the Emergency Department if you experience any worsening of your condition.   Thank you for allowing [place] to be a part of your care.       Sabas Sous, MD 08/07/23 [phone]

## 2023-08-07 NOTE — ED Notes (Signed)
Pt discharged home with daughter.

## 2023-08-07 NOTE — Discharge Instructions (Signed)
You were evaluated in the Emergency Department and after careful evaluation, we did not find any emergent condition requiring admission or further testing in the hospital.  Your exam/testing today is overall reassuring.  Imaging reveals no significant injuries.  Please return to the Emergency Department if you experience any worsening of your condition.   Thank you for allowing [place] to be a part of your care.

## 2023-09-20 DIAGNOSIS — R262 Difficulty in walking, not elsewhere classified: Secondary | ICD-10-CM | POA: Diagnosis not present

## 2023-09-20 DIAGNOSIS — I10 Essential (primary) hypertension: Secondary | ICD-10-CM | POA: Diagnosis not present

## 2023-09-20 DIAGNOSIS — Z299 Encounter for prophylactic measures, unspecified: Secondary | ICD-10-CM | POA: Diagnosis not present

## 2023-09-20 DIAGNOSIS — I700 Atherosclerosis of aorta: Secondary | ICD-10-CM | POA: Diagnosis not present

## 2023-09-20 DIAGNOSIS — I739 Peripheral vascular disease, unspecified: Secondary | ICD-10-CM | POA: Diagnosis not present

## 2023-09-21 DIAGNOSIS — R350 Frequency of micturition: Secondary | ICD-10-CM | POA: Diagnosis not present

## 2023-09-29 DIAGNOSIS — H548 Legal blindness, as defined in USA: Secondary | ICD-10-CM | POA: Diagnosis not present

## 2023-10-03 DIAGNOSIS — I739 Peripheral vascular disease, unspecified: Secondary | ICD-10-CM | POA: Diagnosis not present

## 2023-10-03 DIAGNOSIS — Z7189 Other specified counseling: Secondary | ICD-10-CM | POA: Diagnosis not present

## 2023-10-03 DIAGNOSIS — I10 Essential (primary) hypertension: Secondary | ICD-10-CM | POA: Diagnosis not present

## 2023-10-03 DIAGNOSIS — Z299 Encounter for prophylactic measures, unspecified: Secondary | ICD-10-CM | POA: Diagnosis not present

## 2023-10-03 DIAGNOSIS — Z0000 Encounter for general adult medical examination without abnormal findings: Secondary | ICD-10-CM | POA: Diagnosis not present

## 2023-10-05 ENCOUNTER — Ambulatory Visit: Payer: 59 | Admitting: Urology

## 2023-10-10 DIAGNOSIS — M1990 Unspecified osteoarthritis, unspecified site: Secondary | ICD-10-CM | POA: Diagnosis not present

## 2023-10-10 DIAGNOSIS — G459 Transient cerebral ischemic attack, unspecified: Secondary | ICD-10-CM | POA: Diagnosis not present

## 2023-10-10 DIAGNOSIS — H548 Legal blindness, as defined in USA: Secondary | ICD-10-CM | POA: Diagnosis not present

## 2023-10-11 DIAGNOSIS — Z743 Need for continuous supervision: Secondary | ICD-10-CM | POA: Diagnosis not present

## 2023-10-11 DIAGNOSIS — H548 Legal blindness, as defined in USA: Secondary | ICD-10-CM | POA: Diagnosis not present

## 2023-10-11 DIAGNOSIS — R9089 Other abnormal findings on diagnostic imaging of central nervous system: Secondary | ICD-10-CM | POA: Diagnosis not present

## 2023-10-11 DIAGNOSIS — Z79899 Other long term (current) drug therapy: Secondary | ICD-10-CM | POA: Diagnosis not present

## 2023-10-11 DIAGNOSIS — E162 Hypoglycemia, unspecified: Secondary | ICD-10-CM | POA: Diagnosis not present

## 2023-10-11 DIAGNOSIS — I6523 Occlusion and stenosis of bilateral carotid arteries: Secondary | ICD-10-CM | POA: Diagnosis not present

## 2023-10-11 DIAGNOSIS — R6889 Other general symptoms and signs: Secondary | ICD-10-CM | POA: Diagnosis not present

## 2023-10-11 DIAGNOSIS — Z9889 Other specified postprocedural states: Secondary | ICD-10-CM | POA: Diagnosis not present

## 2023-10-11 DIAGNOSIS — R410 Disorientation, unspecified: Secondary | ICD-10-CM | POA: Diagnosis not present

## 2023-10-11 DIAGNOSIS — X58XXXA Exposure to other specified factors, initial encounter: Secondary | ICD-10-CM | POA: Diagnosis not present

## 2023-10-11 DIAGNOSIS — R404 Transient alteration of awareness: Secondary | ICD-10-CM | POA: Diagnosis not present

## 2023-10-11 DIAGNOSIS — G9389 Other specified disorders of brain: Secondary | ICD-10-CM | POA: Diagnosis not present

## 2023-10-11 DIAGNOSIS — R569 Unspecified convulsions: Secondary | ICD-10-CM | POA: Diagnosis not present

## 2023-10-11 DIAGNOSIS — R5381 Other malaise: Secondary | ICD-10-CM | POA: Diagnosis not present

## 2023-10-11 DIAGNOSIS — M47812 Spondylosis without myelopathy or radiculopathy, cervical region: Secondary | ICD-10-CM | POA: Diagnosis not present

## 2023-10-11 DIAGNOSIS — E785 Hyperlipidemia, unspecified: Secondary | ICD-10-CM | POA: Diagnosis not present

## 2023-10-11 DIAGNOSIS — N390 Urinary tract infection, site not specified: Secondary | ICD-10-CM | POA: Diagnosis not present

## 2023-10-11 DIAGNOSIS — Z1152 Encounter for screening for COVID-19: Secondary | ICD-10-CM | POA: Diagnosis not present

## 2023-10-11 DIAGNOSIS — R259 Unspecified abnormal involuntary movements: Secondary | ICD-10-CM | POA: Diagnosis not present

## 2023-10-11 DIAGNOSIS — H9190 Unspecified hearing loss, unspecified ear: Secondary | ICD-10-CM | POA: Diagnosis not present

## 2023-10-11 DIAGNOSIS — G9341 Metabolic encephalopathy: Secondary | ICD-10-CM | POA: Diagnosis not present

## 2023-10-11 DIAGNOSIS — R162 Hepatomegaly with splenomegaly, not elsewhere classified: Secondary | ICD-10-CM | POA: Diagnosis not present

## 2023-10-11 DIAGNOSIS — Z792 Long term (current) use of antibiotics: Secondary | ICD-10-CM | POA: Diagnosis not present

## 2023-10-11 DIAGNOSIS — R030 Elevated blood-pressure reading, without diagnosis of hypertension: Secondary | ICD-10-CM | POA: Diagnosis not present

## 2023-10-11 DIAGNOSIS — H547 Unspecified visual loss: Secondary | ICD-10-CM | POA: Diagnosis not present

## 2023-10-11 DIAGNOSIS — R9082 White matter disease, unspecified: Secondary | ICD-10-CM | POA: Diagnosis not present

## 2023-10-11 DIAGNOSIS — S0101XA Laceration without foreign body of scalp, initial encounter: Secondary | ICD-10-CM | POA: Diagnosis not present

## 2023-10-11 DIAGNOSIS — Z7982 Long term (current) use of aspirin: Secondary | ICD-10-CM | POA: Diagnosis not present

## 2023-10-11 DIAGNOSIS — I10 Essential (primary) hypertension: Secondary | ICD-10-CM | POA: Diagnosis not present

## 2023-10-11 DIAGNOSIS — I6501 Occlusion and stenosis of right vertebral artery: Secondary | ICD-10-CM | POA: Diagnosis not present

## 2023-10-11 DIAGNOSIS — Z888 Allergy status to other drugs, medicaments and biological substances status: Secondary | ICD-10-CM | POA: Diagnosis not present

## 2023-10-12 DIAGNOSIS — Z79899 Other long term (current) drug therapy: Secondary | ICD-10-CM | POA: Diagnosis not present

## 2023-10-12 DIAGNOSIS — R030 Elevated blood-pressure reading, without diagnosis of hypertension: Secondary | ICD-10-CM | POA: Diagnosis not present

## 2023-10-12 DIAGNOSIS — N390 Urinary tract infection, site not specified: Secondary | ICD-10-CM | POA: Diagnosis not present

## 2023-10-13 DIAGNOSIS — R030 Elevated blood-pressure reading, without diagnosis of hypertension: Secondary | ICD-10-CM | POA: Diagnosis not present

## 2023-10-13 DIAGNOSIS — N390 Urinary tract infection, site not specified: Secondary | ICD-10-CM | POA: Diagnosis not present

## 2023-10-14 DIAGNOSIS — R030 Elevated blood-pressure reading, without diagnosis of hypertension: Secondary | ICD-10-CM | POA: Diagnosis not present

## 2023-10-14 DIAGNOSIS — N390 Urinary tract infection, site not specified: Secondary | ICD-10-CM | POA: Diagnosis not present

## 2023-10-14 DIAGNOSIS — Z79899 Other long term (current) drug therapy: Secondary | ICD-10-CM | POA: Diagnosis not present

## 2023-10-15 DIAGNOSIS — I6523 Occlusion and stenosis of bilateral carotid arteries: Secondary | ICD-10-CM | POA: Diagnosis not present

## 2023-10-15 DIAGNOSIS — R030 Elevated blood-pressure reading, without diagnosis of hypertension: Secondary | ICD-10-CM | POA: Diagnosis not present

## 2023-10-15 DIAGNOSIS — E162 Hypoglycemia, unspecified: Secondary | ICD-10-CM | POA: Diagnosis not present

## 2023-10-15 DIAGNOSIS — N390 Urinary tract infection, site not specified: Secondary | ICD-10-CM | POA: Diagnosis not present

## 2023-10-15 DIAGNOSIS — G9341 Metabolic encephalopathy: Secondary | ICD-10-CM | POA: Diagnosis not present

## 2023-10-15 DIAGNOSIS — Z79899 Other long term (current) drug therapy: Secondary | ICD-10-CM | POA: Diagnosis not present

## 2023-10-15 DIAGNOSIS — R569 Unspecified convulsions: Secondary | ICD-10-CM | POA: Diagnosis not present

## 2023-10-15 DIAGNOSIS — Z792 Long term (current) use of antibiotics: Secondary | ICD-10-CM | POA: Diagnosis not present

## 2023-10-15 DIAGNOSIS — H547 Unspecified visual loss: Secondary | ICD-10-CM | POA: Diagnosis not present

## 2023-10-15 DIAGNOSIS — H9190 Unspecified hearing loss, unspecified ear: Secondary | ICD-10-CM | POA: Diagnosis not present

## 2023-10-16 DIAGNOSIS — Z792 Long term (current) use of antibiotics: Secondary | ICD-10-CM | POA: Diagnosis not present

## 2023-10-16 DIAGNOSIS — R569 Unspecified convulsions: Secondary | ICD-10-CM | POA: Diagnosis not present

## 2023-10-16 DIAGNOSIS — N390 Urinary tract infection, site not specified: Secondary | ICD-10-CM | POA: Diagnosis not present

## 2023-10-16 DIAGNOSIS — I6523 Occlusion and stenosis of bilateral carotid arteries: Secondary | ICD-10-CM | POA: Diagnosis not present

## 2023-10-16 DIAGNOSIS — Z79899 Other long term (current) drug therapy: Secondary | ICD-10-CM | POA: Diagnosis not present

## 2023-10-16 DIAGNOSIS — H9190 Unspecified hearing loss, unspecified ear: Secondary | ICD-10-CM | POA: Diagnosis not present

## 2023-10-16 DIAGNOSIS — H547 Unspecified visual loss: Secondary | ICD-10-CM | POA: Diagnosis not present

## 2023-10-16 DIAGNOSIS — E162 Hypoglycemia, unspecified: Secondary | ICD-10-CM | POA: Diagnosis not present

## 2023-10-16 DIAGNOSIS — R030 Elevated blood-pressure reading, without diagnosis of hypertension: Secondary | ICD-10-CM | POA: Diagnosis not present

## 2023-10-16 DIAGNOSIS — G9341 Metabolic encephalopathy: Secondary | ICD-10-CM | POA: Diagnosis not present

## 2023-10-17 DIAGNOSIS — H547 Unspecified visual loss: Secondary | ICD-10-CM | POA: Diagnosis not present

## 2023-10-17 DIAGNOSIS — R030 Elevated blood-pressure reading, without diagnosis of hypertension: Secondary | ICD-10-CM | POA: Diagnosis not present

## 2023-10-17 DIAGNOSIS — G9341 Metabolic encephalopathy: Secondary | ICD-10-CM | POA: Diagnosis not present

## 2023-10-17 DIAGNOSIS — H9190 Unspecified hearing loss, unspecified ear: Secondary | ICD-10-CM | POA: Diagnosis not present

## 2023-10-17 DIAGNOSIS — I6523 Occlusion and stenosis of bilateral carotid arteries: Secondary | ICD-10-CM | POA: Diagnosis not present

## 2023-10-17 DIAGNOSIS — N390 Urinary tract infection, site not specified: Secondary | ICD-10-CM | POA: Diagnosis not present

## 2023-10-17 DIAGNOSIS — E162 Hypoglycemia, unspecified: Secondary | ICD-10-CM | POA: Diagnosis not present

## 2023-10-18 DIAGNOSIS — E162 Hypoglycemia, unspecified: Secondary | ICD-10-CM | POA: Diagnosis not present

## 2023-10-18 DIAGNOSIS — G9341 Metabolic encephalopathy: Secondary | ICD-10-CM | POA: Diagnosis not present

## 2023-10-18 DIAGNOSIS — R030 Elevated blood-pressure reading, without diagnosis of hypertension: Secondary | ICD-10-CM | POA: Diagnosis not present

## 2023-10-18 DIAGNOSIS — I6523 Occlusion and stenosis of bilateral carotid arteries: Secondary | ICD-10-CM | POA: Diagnosis not present

## 2023-10-18 DIAGNOSIS — H9190 Unspecified hearing loss, unspecified ear: Secondary | ICD-10-CM | POA: Diagnosis not present

## 2023-10-18 DIAGNOSIS — N390 Urinary tract infection, site not specified: Secondary | ICD-10-CM | POA: Diagnosis not present

## 2023-10-18 DIAGNOSIS — H547 Unspecified visual loss: Secondary | ICD-10-CM | POA: Diagnosis not present

## 2023-10-19 DIAGNOSIS — R5381 Other malaise: Secondary | ICD-10-CM | POA: Diagnosis not present

## 2023-10-19 DIAGNOSIS — R030 Elevated blood-pressure reading, without diagnosis of hypertension: Secondary | ICD-10-CM | POA: Diagnosis not present

## 2023-10-19 DIAGNOSIS — N390 Urinary tract infection, site not specified: Secondary | ICD-10-CM | POA: Diagnosis not present

## 2023-10-19 DIAGNOSIS — R569 Unspecified convulsions: Secondary | ICD-10-CM | POA: Diagnosis not present

## 2023-10-19 DIAGNOSIS — E162 Hypoglycemia, unspecified: Secondary | ICD-10-CM | POA: Diagnosis not present

## 2023-10-19 DIAGNOSIS — Z79899 Other long term (current) drug therapy: Secondary | ICD-10-CM | POA: Diagnosis not present

## 2023-10-19 DIAGNOSIS — H548 Legal blindness, as defined in USA: Secondary | ICD-10-CM | POA: Diagnosis not present

## 2023-10-19 DIAGNOSIS — G9341 Metabolic encephalopathy: Secondary | ICD-10-CM | POA: Diagnosis not present

## 2023-10-19 DIAGNOSIS — I6523 Occlusion and stenosis of bilateral carotid arteries: Secondary | ICD-10-CM | POA: Diagnosis not present

## 2023-10-21 DIAGNOSIS — R569 Unspecified convulsions: Secondary | ICD-10-CM | POA: Diagnosis not present

## 2023-10-21 DIAGNOSIS — N390 Urinary tract infection, site not specified: Secondary | ICD-10-CM | POA: Diagnosis not present

## 2023-10-21 DIAGNOSIS — Z79899 Other long term (current) drug therapy: Secondary | ICD-10-CM | POA: Diagnosis not present

## 2023-10-21 DIAGNOSIS — R162 Hepatomegaly with splenomegaly, not elsewhere classified: Secondary | ICD-10-CM | POA: Diagnosis not present

## 2023-10-21 DIAGNOSIS — I6523 Occlusion and stenosis of bilateral carotid arteries: Secondary | ICD-10-CM | POA: Diagnosis not present

## 2023-10-21 DIAGNOSIS — G9341 Metabolic encephalopathy: Secondary | ICD-10-CM | POA: Diagnosis not present

## 2023-10-21 NOTE — Discharge Summary (Signed)
 Hospitalist Discharge Summary   Discharge date:   Oct 21, 2023 Length of stay:    LOS: 10 days    Discharge Service:   [facility] Hospitalists Discharge Attending Physician: Margart Elsie Dragon, DO Discharge to:    To Home with Home Health Condition at Discharge:  fair   Mental Status On day of Discharge:  The patient is Alert and oriented to PERSON The patient is not Alert And oriented to TIME The patient is not Alert and oriented to [facility] course based on timeline of significant events after admission (by date): 4/22: Patient admitted to the hospital for possible new onset seizure.  Patient was loaded with Keppra . 4/23: Patient pretty somnolent.  Unable to work physical therapy occupational therapy. Episode of hypoglycemia required dextrose  infusion and D50. 4/24: Patient continues to be somnolent in the morning.  Another episode of hypoglycemia with amp of D50 required.  In afternoon, patient was agitated, talking with nurses.  4/25: Patient much improved.  Able to work with physical therapy.   ______________________________________   Admission HPI   Patient admitted on: 10/11/2023 10:44 AM  Patient admitted by: Casimiro Charlyne Seidel, MD    CHIEF COMPLAINT:  Possible new onset seizure    Day of admission HPI:  83 year old female with history of hypertension, hyperlipidemia, dementia who was brought in by EMS today after what is reported to be seizure-like activity at home.  This was witnessed by her son this morning.  Patient sustained laceration to the back of her head during this episode.  She is not previously known with seizures, there has been no reports of change in her usual baseline.  Son could not give a report of how long the seizure activity lasted, but called EMS who found patient postictal on arrival.  She was also noted to have bitten her tongue with this episode.  A different report suggests patient may have fallen the night before and sustained laceration on  her scalp and that this preceded the seizure activity this morning.  There may have been a similar episode 2 months ago and patient was reportedly treated at a different emergency room and discharged home.   On arrival in the emergency room, she was found with stable vitals.  Examination revealed a 4 cm linear laceration on the posterior scalp with minimal bleeding.  Labs were unremarkable except for slightly elevated CK likely as a result of her seizure, CT head and neck were negative for acute pathology.  Urine analysis suggests UTI.  Teleneurology was consulted who recommended to get a CTA, urine drug screen as well as MRI of the brain with and without contrast.  Patient was loaded with 1 g Keppra  and recommendation is to keep her on 500 mg IV Keppra  every 12 hourly.  She will be followed up as an outpatient for further workup if no further seizure activity after 24 hours.   Patient admitted on Home O2? - no Patient on home anticoagulant? -  no Patient admitted with Chronic home foley catheter? - no Foley catheter placed or replaced by another service prior to admission? - no   Mental Status on Admission: The patient is Alert and oriented to PERSON The patient is not Alert And oriented to TIME The patient is not Alert and oriented to LOCATION   Problem List, Assessment & Plan    ASSESSMENT & PLAN (In order of descending acuity)  No change to current treatment plan, unfortunately patient denied at [facility], will plan  to discharge home with family support tomorrow 5/2, and any available services to help care for patient.  Daughter has talked with social work on steps to transition Medicaid in order for patient to proceed with long-term placement at a facility if she desires.   New onset seizure - Per neurology consult, patient was loaded with 1 g IV Keppra  - MRI brain with and without contrast negative for acute events but shows moderate white matter disease likely ischemic - CTA head  and neck reveals severe stenosis right ICA and moderate to severe stenosis proximal intracranial right vertebral artery as well as moderate atherosclerotic change bilateral cavernous internal carotid arteries - Patient was treated with 500 mg of Keppra  every 12 hours, but patient was very somnolent with this regimen and unable to to participate with staff or physical therapy on 4/23 or 4/24 in the morning - Seroquel was discontinued - Keppra  dose was decreased to 250 mg nightly - Patient interacting with family, will sometimes interact with staff, she is eating well   Acute metabolic encephalopathy - After patient was admitted, patient remained very somnolent on 4/22, 4/23 and 4/24, unable to eat or participate meaningfully with therapy evaluation - Thought to be secondary to patient's UTI and new medications with Keppra  - Seroquel was discontinued in the hospital, but can be resumed at discharge at patient's home dose if needed for agitation at night - There was initial concern the patient may need to be transferred for continuous EEG monitoring if she did not improve - Patient has since improved, she is able to interact with staff, and follow commands, will ask physical therapy to continue to work with patient as able.  Per daughter patient is back to her baseline mental status   Hypoglycemia -This has been recurrent when patient was not eating, episodes on 4/23 and 4/24 - She has required IV fluids with dextrose  and amps of D50 - Will stop glucose checks at this time, patient is tolerating p.o. intake well   Dementia - Patient was taking Seroquel at home - Patient had been on Seroquel while admitted, but due to continued confusion this was discontinued - Continue Keppra  250 mg daily, no recurrent seizure-like episodes while hospitalized, continue low-dose Seroquel at discharge as needed nightly for agitation   Bilateral carotid stenosis - In patient's current state, she is likely not a  candidate for vascular surgery, this was discussed with patient's son and previous provider   High blood pressure - Continue home medications   UTI - Urine cultures growing E. coli, which was sensitive to cephalosporins - Completed course of IV ceftriaxone    Physical debility - Unfortunately not a candidate for short-term rehab, and no accepting facilities - Daughter understands that she can still pursue long-term placement for her mother if needed however will continue to care for her at home.  Social work has discussed that if seeking long-term placement patient would need long-term Medicaid in place which can take several months.  Case management has arranged for patient to have home health and physical therapy through [facility].   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   Deafness - Patient uses hearing aids  ADDITIONAL PATIENT FINDINGS OR OBSERVATIONS  Patient remains at baseline mental status, she is intermittently cooperative with care She is in no acute distress Lungs are clear to auscultation bilaterally Heart regular rate and rhythm She moves all extremities well She has chronic visual and hearing deficits, legally blind  She is oriented to self, she has underlying dementia and mental status at baseline at time of discharge  DVT prophylaxis while in hospital:  enoxaparin   _____________________________________  Vital Signs: BP 125/79   Pulse 60   Temp 36.4 C (97.5 F) (Axillary)   Resp 18   Ht 160 cm (5' 3)   Wt 48.8 kg (107 lb 9.6 oz)   SpO2 100%   BMI 19.06 kg/m   Nutrition:                                  Diet Instructions     Discharge diet (specify)     Discharge Nutrition Therapy: Regular       Unable to complete Malnutrition Assessment at this time due to (comment) (10/19/23 1045)              CODE STATUS :                    Full Code   Patient discharged on Home O2? - no Patient discharged on home  anticoagulant? -  no Patient discharged with Chronic home foley catheter? - no  Time Spent on Discharge I spent greater than 30 minutes counseling and coordinating care for the discharge of this patient. The daughter and I discussed the importance of outpatient follow-up as well as concerning signs and symptoms that would require immediate evaluation by a medical professional. The aforementioned conversation participants understand  and did show insight. I did use teachback to ensure understanding. The above participant/s is aware that not following the discussed plan, recommendations, and follow up can lead to severe negative effects on the patient's health, up to and including death.    Discharge Medications     Your Medication List     START taking these medications    levETIRAcetam  250 MG tablet Commonly known as: KEPPRA  Take 1 tablet (250 mg total) by mouth nightly.       CONTINUE taking these medications    aspirin  81 MG chewable tablet Chew 1 tablet (81 mg total) daily.   Seroquel 25 mg tablet Generic drug: QUEtiapine Take 1 tablet (25 mg total) by mouth nightly.       ____________________________________________  Discharge Instructions   Nutrition:                                  Diet Instructions     Discharge diet (specify)     Discharge Nutrition Therapy: Regular       Activity:                                   Activity Instructions     Activity as tolerated         Appointments:                         Appointments which have been scheduled for you   YOU HAVE BEEN REFERRED TO [facility]. THEY WILL CALL YOU WITH A DETERMINATION OF CARE WITHIN A WEEK.  YOU WILL RECEIVE SERVICES FROM [facility] FOR PT/OT UPON DISCHARGE. THEY WILL CALL YOU WITH A DATE/TIME OF ADMISSION.      Follow Up:  Follow Up instructions and Outpatient Referrals    Ambulatory Referral to  Palliative Care     Reason for referral: Dementia; debility   Is this a palliative referral for:  symptom management coping support     Ambulatory Referral to Home Health     Reason for referral: Recent fall with scalp laceration; dementia;  debility   Disciplines requested:  Physical Therapy Occupational Therapy     Physical Therapy requested:  Home safety evaluation Ambulation training Transfer training Evaluate and treat Strengthening exercises     Occupational Therapy Requested:  Home safety evaluation Transfer training ADL or IADL training Cognitive training Evaluate and treat Strengthening exercises     Physician to follow patient's care: PCP Comment - Dr. Maree   Call MD for:  difficulty breathing, headache or visual disturbances     Call MD for:  extreme fatigue     Call MD for:  persistent dizziness or light-headedness     Call MD for:  persistent nausea or vomiting     Call MD for:  severe uncontrolled pain     Call MD for: Temperature > 38.5 Celsius ( > 101.3 Fahrenheit)     Discharge instructions         Allergies   Allergies  Allergen Reactions  . Metronidazole Itching and Rash      Past Medical History   Past Medical History:  Diagnosis Date  . Dementia (CMS-HCC)   . Depression   . Hyperlipidemia   . Hypertension        Lab Results   Recent Labs    Units 10/20/23 0539  WBC 10*9/L 4.6  HGB g/dL 86.9  HCT % 61.4  PLT 89*0/O 293   Recent Labs    Units 10/20/23 0539  NA mmol/L 141  K mmol/L 3.4*  CL mmol/L 105  CO2 mmol/L 27.7  BUN mg/dL 33*  CREATININE mg/dL 9.31  GLU mg/dL 81  CALCIUM  mg/dL 8.9  ALBUMIN g/dL 3.4*  PROT g/dL 6.3  BILITOT mg/dL 0.9  AST U/L 20  ALT U/L 28  ALKPHOS U/L 93   No results for input(s): CKTOTAL, TROPONINI, TROPONINT, CKMB, EDTPNI, BNP, CHOL, LDL, HDL, TRIG in the last 168 hours. No results for input(s): INR, LABPROT, APTT, DDIMER in the last 168 hours. No results for  input(s): TSH, ETOH, ACETAMIN, SALICYLATE, FREET3, T4FREE, A1C, TTR, ESR, CRP, HSCRP, ANA, RF, VITAMINB12, FOLATE, IRON, LABIRON, TIBC, FERRITIN, RETIC, RETICCTPCT, C3, C4, LDH, HAPTM, URICACID, HEPP4, CEA, RAPSCRN, CDIFRPCR, CDIFFNAP1, HIV12SCRN, HIVCP, TBCELLQN in the last 168 hours.  Invalid input(s): HIVSCRN  No results for input(s): HEPAIGM, HEPBSAG, HEPBIGM, HEPCAB, MITOAB in the last 168 hours. No results for input(s): WBCUA, NITRITE, LEUKOCYTESUR, BACTERIA, RBCUA, BLOODU, GLUCOSEU, PROTEINUA, KETONESU in the last 168 hours. No results for input(s): KETUR, PREGTESTUR, PREGPOC, NAURINE, LABOSMO, OSMOFT, PROTEINUR, CREATUR, PCRATIOUR, OPIAU, BENZU, TRICYCLIC, PCPU, AMPHU, COCAU, CANNAU, BARBU, URPROTELEC in the last 168 hours. No results for input(s): FTYP1, WBCFLUID, FNEUT, LYMPHSFL, FMONO, EOSFL, RBCFL, CLARITYFLUID, COLORFL in the last 168 hours. No results for input(s): O2SOUR, FIO2ART, PHART, PCO2ART, PO2ART, HCO3ART, O2SATART, BEART in the last 72 hours.  Imaging  CTA Head And Neck W Contrast Result Date: 10/11/2023 Exam: CT Angiogram of the Neck and Head with Contrast  History:  83 year old. Seizure-like activity.  Technique: CTA through the head and neck with IV contrast.  Image postprocessing and 3-D reconstructions at an independent workstation is performed and the rendered images that are generated are sent to the patient's  image file in PACS. AEC (automated exposure control) and/or manual techniques such as size-specific kV and mAs are employed where appropriate to reduce radiation exposure for all CT exams.  Comparison:  Brain MRI. Same day.  Findings:  AORTIC ARCH:  The most common configuration of arch anatomy is present. The proximal great vessels are normal in appearance.  VERTEBRAL ARTERIES:  Both vertebral arteries  are normal in appearance. Right vertebral artery is dominant.  RIGHT CAROTID SYSTEM:  Severe stenosis at the origin of the right ICA.  LEFT CAROTID SYSTEM:  The left common carotid artery, internal carotid artery, and external carotid artery appear normal.  COMMENT:  All measurements of stenosis in the report above were calculated using the NASCET [facility] American Symptomatic Carotid Endarterectomy Trial) methodology for measuring carotid artery stenosis, whereby the narrowest transverse measurement of luminal dimension at the point of the maximum stenosis is stated relative to the diameter of the closest normal-appearing portion of the same vessel beyond the stenosis.  INTRACRANIAL: moderate-severe stenosis of the intracranial right vertebral artery. Moderate atherosclerotic irregularity of the cavernous internal carotid arteries bilaterally. No large vessel occlusion. No CTA evidence of aneurysm formation around the circle of Willis..    1. Examination compromised by patient motion and associated artifact. 2. Severe stenosis right ICA origin. 3. Moderate-severe stenosis proximal intracranial right vertebral artery. Moderate atherosclerotic irregularity cavernous internal carotid arteries bilaterally.    Signed (Electronic Signature): 10/11/2023 9:42 PM Signed By: Fairy KATHEE Shipper, MD  MRI brain with and without contrast Result Date: 10/11/2023 Exam:  MRI Brain without and with Contrast  History: 83 year old. Seizure-like activity.  Technique: Complete brain MRI with and without IV contrast.  Comparison: Head CT. October 11, 2023. Brain MRI. March 07, 2023.  Findings: No evidence of an acute ischemic infarction, hemorrhage, edema, mass, or mass effect. Confluent FLAIR and T2 hyperintense signal change is present affecting the periventricular, central, and peripheral white matter of both hemispheres. This is unchanged when  compared with the prior study. Moderate central and peripheral volume loss, unchanged,  age-appropriate. No abnormal intracranial contrast enhancement. Small focus of susceptibility artifact is present in the right parietal lobe, unchanged. Cavernoma versus a small remote petechial hemorrhage.  Orbits and sellar/suprasellar region are without focal abnormality. Major intracranial vascular flow voids are patent. Calvarium, midline structures, and imaged extra cranial soft tissues unremarkable. Sinuses and mastoid air cells are clear.    1. No acute intracranial abnormality. 2. Moderate white matter disease, likely ischemic and due to microvascular disease.  Signed (Electronic Signature): 10/11/2023 5:45 PM Signed By: Fairy KATHEE Shipper, MD  CT cervical spine without contrast Result Date: 10/11/2023 Exam:  CT Cervical Spine without Contrast  History:  Seizure.  Fall.  Technique: Routine cervical spine CT without IV contrast. AEC (automated exposure control) and/or manual techniques such as size-specific kV and mAs are employed where appropriate to reduce radiation exposure for all CT exams.  Comparison:  05/08/2022.  Findings:   CERVICAL AND UPPER THORACIC SPINE:  Normal alignment. No fractures or destructive osseous lesions.  Moderate degenerative changes, similar to prior.  NECK SOFT TISSUES:  Negative. No prevertebral edema.  SKULL BASE AND POSTERIOR FOSSA:  Negative.  LUNG APICES AND SUPERIOR MEDIASTINUM:  Negative.    No acute fracture or malalignment.  Signed (Electronic Signature): 10/11/2023 2:13 PM Signed By: Thresa DELENA Goldmann, MD  CT head without contrast Result Date: 10/11/2023 Exam:  CT Head without Contrast  History:  Seizure.  Fall.  Technique: Routine brain CT  without IV contrast.  AEC (automated exposure control) and/or manual techniques such as size-specific kV and mAs are employed where appropriate to reduce radiation exposure for all CT exams.  Comparison:  10/15/2022.  Findings:   BRAIN:  No CT evidence of acute infarction, hemorrhage, edema, mass, or mass effect. Moderate  supratentorial white matter areas of hypoattenuation; otherwise, no abnormal attenuation throughout the brain parenchyma. Moderate cerebral volume loss accounts for ventricular enlargement. Basilar cisterns patent.  SOFT TISSUES:  Carotid siphon calcifications.  CALVARIUM:  No fracture. No suspicious lesions.  SINUSES AND MASTOIDS:  No mucosal thickening or fluid.    No acute pathology with chronic changes as above.  Signed (Electronic Signature): 10/11/2023 2:05 PM Signed By: Thresa DELENA Goldmann, MD   Margart Dragon, DO Hospitalist, [facility] 10/21/23, 9:20 AM

## 2023-10-24 DIAGNOSIS — G301 Alzheimer's disease with late onset: Secondary | ICD-10-CM | POA: Diagnosis not present

## 2023-10-24 DIAGNOSIS — I6523 Occlusion and stenosis of bilateral carotid arteries: Secondary | ICD-10-CM | POA: Diagnosis not present

## 2023-10-24 DIAGNOSIS — I10 Essential (primary) hypertension: Secondary | ICD-10-CM | POA: Diagnosis not present

## 2023-10-24 DIAGNOSIS — G9341 Metabolic encephalopathy: Secondary | ICD-10-CM | POA: Diagnosis not present

## 2023-10-26 DIAGNOSIS — G9341 Metabolic encephalopathy: Secondary | ICD-10-CM | POA: Diagnosis not present

## 2023-10-26 DIAGNOSIS — I10 Essential (primary) hypertension: Secondary | ICD-10-CM | POA: Diagnosis not present

## 2023-10-26 DIAGNOSIS — I6523 Occlusion and stenosis of bilateral carotid arteries: Secondary | ICD-10-CM | POA: Diagnosis not present

## 2023-10-26 DIAGNOSIS — G301 Alzheimer's disease with late onset: Secondary | ICD-10-CM | POA: Diagnosis not present

## 2023-10-28 DIAGNOSIS — G9341 Metabolic encephalopathy: Secondary | ICD-10-CM | POA: Diagnosis not present

## 2023-10-28 DIAGNOSIS — Z299 Encounter for prophylactic measures, unspecified: Secondary | ICD-10-CM | POA: Diagnosis not present

## 2023-10-28 DIAGNOSIS — I6523 Occlusion and stenosis of bilateral carotid arteries: Secondary | ICD-10-CM | POA: Diagnosis not present

## 2023-10-28 DIAGNOSIS — G301 Alzheimer's disease with late onset: Secondary | ICD-10-CM | POA: Diagnosis not present

## 2023-10-28 DIAGNOSIS — I10 Essential (primary) hypertension: Secondary | ICD-10-CM | POA: Diagnosis not present

## 2023-10-28 DIAGNOSIS — R569 Unspecified convulsions: Secondary | ICD-10-CM | POA: Diagnosis not present

## 2023-10-28 DIAGNOSIS — I779 Disorder of arteries and arterioles, unspecified: Secondary | ICD-10-CM | POA: Diagnosis not present

## 2023-10-31 DIAGNOSIS — I10 Essential (primary) hypertension: Secondary | ICD-10-CM | POA: Diagnosis not present

## 2023-10-31 DIAGNOSIS — G9341 Metabolic encephalopathy: Secondary | ICD-10-CM | POA: Diagnosis not present

## 2023-10-31 DIAGNOSIS — I6523 Occlusion and stenosis of bilateral carotid arteries: Secondary | ICD-10-CM | POA: Diagnosis not present

## 2023-10-31 DIAGNOSIS — G301 Alzheimer's disease with late onset: Secondary | ICD-10-CM | POA: Diagnosis not present

## 2023-11-01 DIAGNOSIS — G301 Alzheimer's disease with late onset: Secondary | ICD-10-CM | POA: Diagnosis not present

## 2023-11-01 DIAGNOSIS — H548 Legal blindness, as defined in USA: Secondary | ICD-10-CM | POA: Diagnosis not present

## 2023-11-01 DIAGNOSIS — M1990 Unspecified osteoarthritis, unspecified site: Secondary | ICD-10-CM | POA: Diagnosis not present

## 2023-11-01 DIAGNOSIS — G459 Transient cerebral ischemic attack, unspecified: Secondary | ICD-10-CM | POA: Diagnosis not present

## 2023-11-01 DIAGNOSIS — R2681 Unsteadiness on feet: Secondary | ICD-10-CM | POA: Diagnosis not present

## 2023-11-01 DIAGNOSIS — I10 Essential (primary) hypertension: Secondary | ICD-10-CM | POA: Diagnosis not present

## 2023-11-01 DIAGNOSIS — G9341 Metabolic encephalopathy: Secondary | ICD-10-CM | POA: Diagnosis not present

## 2023-11-01 DIAGNOSIS — I6523 Occlusion and stenosis of bilateral carotid arteries: Secondary | ICD-10-CM | POA: Diagnosis not present

## 2023-11-03 DIAGNOSIS — I6523 Occlusion and stenosis of bilateral carotid arteries: Secondary | ICD-10-CM | POA: Diagnosis not present

## 2023-11-03 DIAGNOSIS — I10 Essential (primary) hypertension: Secondary | ICD-10-CM | POA: Diagnosis not present

## 2023-11-03 DIAGNOSIS — G9341 Metabolic encephalopathy: Secondary | ICD-10-CM | POA: Diagnosis not present

## 2023-11-03 DIAGNOSIS — G301 Alzheimer's disease with late onset: Secondary | ICD-10-CM | POA: Diagnosis not present

## 2023-11-04 DIAGNOSIS — I6523 Occlusion and stenosis of bilateral carotid arteries: Secondary | ICD-10-CM | POA: Diagnosis not present

## 2023-11-04 DIAGNOSIS — G9341 Metabolic encephalopathy: Secondary | ICD-10-CM | POA: Diagnosis not present

## 2023-11-04 DIAGNOSIS — G301 Alzheimer's disease with late onset: Secondary | ICD-10-CM | POA: Diagnosis not present

## 2023-11-07 DIAGNOSIS — G301 Alzheimer's disease with late onset: Secondary | ICD-10-CM | POA: Diagnosis not present

## 2023-11-07 DIAGNOSIS — G9341 Metabolic encephalopathy: Secondary | ICD-10-CM | POA: Diagnosis not present

## 2023-11-07 DIAGNOSIS — I10 Essential (primary) hypertension: Secondary | ICD-10-CM | POA: Diagnosis not present

## 2023-11-07 DIAGNOSIS — I6523 Occlusion and stenosis of bilateral carotid arteries: Secondary | ICD-10-CM | POA: Diagnosis not present

## 2023-11-08 DIAGNOSIS — I6523 Occlusion and stenosis of bilateral carotid arteries: Secondary | ICD-10-CM | POA: Diagnosis not present

## 2023-11-08 DIAGNOSIS — G301 Alzheimer's disease with late onset: Secondary | ICD-10-CM | POA: Diagnosis not present

## 2023-11-08 DIAGNOSIS — G9341 Metabolic encephalopathy: Secondary | ICD-10-CM | POA: Diagnosis not present

## 2023-11-08 DIAGNOSIS — I10 Essential (primary) hypertension: Secondary | ICD-10-CM | POA: Diagnosis not present

## 2023-11-09 DIAGNOSIS — M1990 Unspecified osteoarthritis, unspecified site: Secondary | ICD-10-CM | POA: Diagnosis not present

## 2023-11-09 DIAGNOSIS — G301 Alzheimer's disease with late onset: Secondary | ICD-10-CM | POA: Diagnosis not present

## 2023-11-09 DIAGNOSIS — H548 Legal blindness, as defined in USA: Secondary | ICD-10-CM | POA: Diagnosis not present

## 2023-11-09 DIAGNOSIS — I6523 Occlusion and stenosis of bilateral carotid arteries: Secondary | ICD-10-CM | POA: Diagnosis not present

## 2023-11-09 DIAGNOSIS — I10 Essential (primary) hypertension: Secondary | ICD-10-CM | POA: Diagnosis not present

## 2023-11-09 DIAGNOSIS — G459 Transient cerebral ischemic attack, unspecified: Secondary | ICD-10-CM | POA: Diagnosis not present

## 2023-11-09 DIAGNOSIS — G9341 Metabolic encephalopathy: Secondary | ICD-10-CM | POA: Diagnosis not present

## 2023-11-10 DIAGNOSIS — G301 Alzheimer's disease with late onset: Secondary | ICD-10-CM | POA: Diagnosis not present

## 2023-11-10 DIAGNOSIS — I6523 Occlusion and stenosis of bilateral carotid arteries: Secondary | ICD-10-CM | POA: Diagnosis not present

## 2023-11-10 DIAGNOSIS — G9341 Metabolic encephalopathy: Secondary | ICD-10-CM | POA: Diagnosis not present

## 2023-11-10 DIAGNOSIS — I10 Essential (primary) hypertension: Secondary | ICD-10-CM | POA: Diagnosis not present

## 2023-11-16 DIAGNOSIS — I6523 Occlusion and stenosis of bilateral carotid arteries: Secondary | ICD-10-CM | POA: Diagnosis not present

## 2023-11-16 DIAGNOSIS — I10 Essential (primary) hypertension: Secondary | ICD-10-CM | POA: Diagnosis not present

## 2023-11-16 DIAGNOSIS — G301 Alzheimer's disease with late onset: Secondary | ICD-10-CM | POA: Diagnosis not present

## 2023-11-16 DIAGNOSIS — G9341 Metabolic encephalopathy: Secondary | ICD-10-CM | POA: Diagnosis not present

## 2023-11-21 DIAGNOSIS — Z7409 Other reduced mobility: Secondary | ICD-10-CM | POA: Diagnosis not present

## 2023-11-21 DIAGNOSIS — Z515 Encounter for palliative care: Secondary | ICD-10-CM | POA: Diagnosis not present

## 2023-11-21 DIAGNOSIS — G301 Alzheimer's disease with late onset: Secondary | ICD-10-CM | POA: Diagnosis not present

## 2023-11-21 DIAGNOSIS — M6281 Muscle weakness (generalized): Secondary | ICD-10-CM | POA: Diagnosis not present

## 2023-11-21 DIAGNOSIS — R634 Abnormal weight loss: Secondary | ICD-10-CM | POA: Diagnosis not present

## 2023-11-21 DIAGNOSIS — I6523 Occlusion and stenosis of bilateral carotid arteries: Secondary | ICD-10-CM | POA: Diagnosis not present

## 2023-11-21 DIAGNOSIS — G9341 Metabolic encephalopathy: Secondary | ICD-10-CM | POA: Diagnosis not present

## 2023-11-21 DIAGNOSIS — R296 Repeated falls: Secondary | ICD-10-CM | POA: Diagnosis not present

## 2023-11-21 DIAGNOSIS — I10 Essential (primary) hypertension: Secondary | ICD-10-CM | POA: Diagnosis not present

## 2023-11-22 DIAGNOSIS — G301 Alzheimer's disease with late onset: Secondary | ICD-10-CM | POA: Diagnosis not present

## 2023-11-22 DIAGNOSIS — I6523 Occlusion and stenosis of bilateral carotid arteries: Secondary | ICD-10-CM | POA: Diagnosis not present

## 2023-11-22 DIAGNOSIS — G9341 Metabolic encephalopathy: Secondary | ICD-10-CM | POA: Diagnosis not present

## 2023-11-22 DIAGNOSIS — I10 Essential (primary) hypertension: Secondary | ICD-10-CM | POA: Diagnosis not present

## 2023-11-23 DIAGNOSIS — I10 Essential (primary) hypertension: Secondary | ICD-10-CM | POA: Diagnosis not present

## 2023-11-23 DIAGNOSIS — I6523 Occlusion and stenosis of bilateral carotid arteries: Secondary | ICD-10-CM | POA: Diagnosis not present

## 2023-11-23 DIAGNOSIS — G301 Alzheimer's disease with late onset: Secondary | ICD-10-CM | POA: Diagnosis not present

## 2023-11-23 DIAGNOSIS — G9341 Metabolic encephalopathy: Secondary | ICD-10-CM | POA: Diagnosis not present

## 2023-11-28 ENCOUNTER — Ambulatory Visit (INDEPENDENT_AMBULATORY_CARE_PROVIDER_SITE_OTHER): Payer: 59 | Admitting: Urology

## 2023-11-28 ENCOUNTER — Encounter: Payer: Self-pay | Admitting: Urology

## 2023-11-28 VITALS — BP 128/77 | HR 83

## 2023-11-28 DIAGNOSIS — R339 Retention of urine, unspecified: Secondary | ICD-10-CM | POA: Diagnosis not present

## 2023-11-28 DIAGNOSIS — N3941 Urge incontinence: Secondary | ICD-10-CM

## 2023-11-28 DIAGNOSIS — N3000 Acute cystitis without hematuria: Secondary | ICD-10-CM

## 2023-11-28 DIAGNOSIS — Z87440 Personal history of urinary (tract) infections: Secondary | ICD-10-CM

## 2023-11-28 LAB — URINALYSIS, ROUTINE W REFLEX MICROSCOPIC
Bilirubin, UA: NEGATIVE
Glucose, UA: NEGATIVE
Ketones, UA: NEGATIVE
Nitrite, UA: NEGATIVE
Protein,UA: NEGATIVE
Specific Gravity, UA: 1.025 (ref 1.005–1.030)
Urobilinogen, Ur: 1 mg/dL (ref 0.2–1.0)
pH, UA: 6 (ref 5.0–7.5)

## 2023-11-28 LAB — MICROSCOPIC EXAMINATION

## 2023-11-28 MED ORDER — NITROFURANTOIN MACROCRYSTAL 50 MG PO CAPS
50.0000 mg | ORAL_CAPSULE | Freq: Every day | ORAL | 11 refills | Status: AC
Start: 2023-11-28 — End: ?

## 2023-11-28 MED ORDER — NITROFURANTOIN MONOHYD MACRO 100 MG PO CAPS
100.0000 mg | ORAL_CAPSULE | Freq: Two times a day (BID) | ORAL | 0 refills | Status: DC
Start: 2023-11-28 — End: 2024-02-26

## 2023-11-28 NOTE — Patient Instructions (Signed)

## 2023-11-28 NOTE — Progress Notes (Signed)
 11/28/2023 2:59 PM   Anna Jacobs 22-Oct-1940 [id number]  Referring provider: Lauran Pollard, MD [address] [place],  [place] [address]  dysuria   HPI: Anna Jacobs is a 82yo here for followup for OAB and frequent UTI. She was hospitalized for a UTI in 09/2023. UA today is concerning for infection. She stopped mirabegron  1 year ago. She uses 3-4 pads per day. She occasionally soaks the bed at night. She has a hx of seizures and is on keppra.    PMH: Past Medical History:  Diagnosis Date   Allergy    Anxiety    B12 deficiency    Degenerative joint disease    Depression    GERD (gastroesophageal reflux disease)    EGD 07/02/2003 Dr. Riley Cheadle   Hearing loss    Hyperlipemia    Hypertension    IBS (irritable bowel syndrome)    Colonoscopy normal Dr. Riley Cheadle 06/2003   Insomnia    Parkinson's disease 2001   Peripheral neuropathy    Restless leg syndrome    RP (retinitis pigmentosa)    Sleep apnea    TIA (transient ischemic attack)    Urinary incontinence     Surgical History: Past Surgical History:  Procedure Laterality Date   Benign breast biopsy     left (remotely) and right (06/2012)   BIOPSY N/A 11/29/2012   Procedure: BIOPSY;  Surgeon: Suzette Espy, MD;  Location: [facility];  Service: Endoscopy;  Laterality: N/A;  RANDOM BIOPSY   CHOLECYSTECTOMY  2000   COLONOSCOPY N/A 11/29/2012   VQQ:[id number] rectal polyps-removed Otherwise normal examination distal terminal ileum appeared normal. Random colon biopsies were negative for microscopic colitis. Rectum polyp, tubular adenoma. Next TCS 11/2019.   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 If you have any questions, ask your nurse or doctor.          STOP taking these medications    atorvastatin  40 MG tablet Commonly known as: LIPITOR Stopped by: Johnie Nailer   DULoxetine 60 MG capsule Commonly known as: CYMBALTA Stopped by: Johnie Nailer   famotidine  20 MG tablet Commonly known as: Pepcid  Stopped by: Johnie Nailer   mirtazapine 30 MG tablet Commonly known as: REMERON Stopped by: Johnie Nailer   nitrofurantoin  (macrocrystal-monohydrate) 100 MG capsule Commonly known as: MACROBID  Stopped by: Johnie Nailer   traZODone  50 MG tablet Commonly known as: DESYREL  Stopped by: Johnie Nailer   valsartan  40 MG tablet Commonly known as: Diovan  Stopped by: Johnie Nailer       TAKE these medications    aspirin  81 MG chewable tablet Chew by mouth daily.   docusate sodium  100 MG capsule Commonly known as: Colace Take 1 capsule (100 mg total) by mouth 2 (two) times daily. Take as needed for hard stools   escitalopram  20 MG tablet Commonly known as: LEXAPRO  Take 20 mg by mouth daily.   lactose free nutrition Liqd Take 237 mLs by mouth 2 (two) times daily between meals.   levETIRAcetam  250 MG tablet Commonly known as: KEPPRA Take 250 mg by mouth 2 (two) times daily.   vitamin B-12 500 MCG tablet Commonly known as: CYANOCOBALAMIN Take 500 mcg by mouth daily.   Vitamin D 50 MCG (2000 UT) tablet Take 2,000 Units by mouth daily.        Allergies:  Allergies  Allergen Reactions   Metronidazole    Flagyl [Metronidazole Hcl] Itching and Rash    Family History: Family History  Problem Relation Age of Onset   Lymphoma Mother    Coronary artery disease Father    Esophageal cancer Brother    Lung cancer Brother    Ovarian cancer Daughter 84   Colon cancer Neg Hx    Rectal cancer Neg Hx    Stomach cancer Neg Hx    Parkinson's disease Neg Hx     Social  History:  reports that she has quit smoking. She has never used smokeless tobacco. She reports that she does not drink alcohol and does not use drugs.  ROS: All other review of systems were reviewed and are negative except what is noted above in HPI  Physical Exam: BP 128/77   Pulse 83   Constitutional:  Alert and oriented, No acute distress. HEENT:  AT, moist mucus membranes.  Trachea midline, no masses. Cardiovascular: No clubbing, cyanosis, or edema. Respiratory: Normal respiratory effort, no increased work of breathing. GI: Abdomen is soft, nontender, nondistended, no abdominal masses GU: No CVA tenderness.  Lymph: No cervical or inguinal lymphadenopathy. Skin: No rashes, bruises or suspicious lesions. Neurologic: Grossly intact, no focal deficits, moving all 4 extremities. Psychiatric: Normal mood and affect.  Laboratory Data: Lab Results  Component Value Date   WBC 5.4 06/24/2020   HGB 14.0 06/24/2020   HCT 43.3 06/24/2020   MCV 92.7 06/24/2020   PLT 333 06/24/2020    Lab Results  Component Value Date   CREATININE 1.01 (H) 08/18/2021    No results found for: "PSA"  No results found for: "TESTOSTERONE"  No results found for: "HGBA1C"  Urinalysis    Component Value Date/Time   APPEARANCEUR Cloudy (A) 04/06/2023 1438   GLUCOSEU Negative 04/06/2023 1438   BILIRUBINUR Negative 04/06/2023 1438   PROTEINUR Negative 04/06/2023 1438   NITRITE Positive (A) 04/06/2023 1438   LEUKOCYTESUR 1+ (A) 04/06/2023 1438    Lab Results  Component Value Date   LABMICR See below: 04/06/2023   WBCUA 11-30 (A) 04/06/2023   LABEPIT 0-10 04/06/2023   BACTERIA Many (A) 04/06/2023    Pertinent Imaging:  No results found for this or any previous visit.  No results found for this or any previous visit.  No results found for this or any previous visit.  No results found for this or any previous visit.  No results found for this or any previous visit.  No results found  for this or any previous visit.  No results found for this or any previous visit.  No results found for this or any previous visit.   Assessment & Plan:    1. Acute cystitis without hematuria (Primary) Urine for culture Macrobid  100mg  BID for 7 days We will start macrodantin  50mg  at bedtime for prophylaxis - Urinalysis, Routine w reflex microscopic  2. Urge incontinence -patient defers therapy at this time.    No follow-ups on file.  Johnie Nailer, MD  [facility] [place]

## 2023-12-01 DIAGNOSIS — G9341 Metabolic encephalopathy: Secondary | ICD-10-CM | POA: Diagnosis not present

## 2023-12-01 DIAGNOSIS — I6523 Occlusion and stenosis of bilateral carotid arteries: Secondary | ICD-10-CM | POA: Diagnosis not present

## 2023-12-01 DIAGNOSIS — G301 Alzheimer's disease with late onset: Secondary | ICD-10-CM | POA: Diagnosis not present

## 2023-12-01 DIAGNOSIS — I10 Essential (primary) hypertension: Secondary | ICD-10-CM | POA: Diagnosis not present

## 2023-12-01 LAB — URINE CULTURE

## 2023-12-06 DIAGNOSIS — I6523 Occlusion and stenosis of bilateral carotid arteries: Secondary | ICD-10-CM | POA: Diagnosis not present

## 2023-12-06 DIAGNOSIS — G9341 Metabolic encephalopathy: Secondary | ICD-10-CM | POA: Diagnosis not present

## 2023-12-06 DIAGNOSIS — I10 Essential (primary) hypertension: Secondary | ICD-10-CM | POA: Diagnosis not present

## 2023-12-06 DIAGNOSIS — G301 Alzheimer's disease with late onset: Secondary | ICD-10-CM | POA: Diagnosis not present

## 2023-12-07 ENCOUNTER — Ambulatory Visit: Payer: Self-pay

## 2023-12-07 DIAGNOSIS — L110 Acquired keratosis follicularis: Secondary | ICD-10-CM | POA: Diagnosis not present

## 2023-12-07 DIAGNOSIS — M79674 Pain in right toe(s): Secondary | ICD-10-CM | POA: Diagnosis not present

## 2023-12-07 DIAGNOSIS — I739 Peripheral vascular disease, unspecified: Secondary | ICD-10-CM | POA: Diagnosis not present

## 2023-12-07 DIAGNOSIS — L609 Nail disorder, unspecified: Secondary | ICD-10-CM | POA: Diagnosis not present

## 2023-12-07 DIAGNOSIS — M79672 Pain in left foot: Secondary | ICD-10-CM | POA: Diagnosis not present

## 2023-12-07 DIAGNOSIS — M79675 Pain in left toe(s): Secondary | ICD-10-CM | POA: Diagnosis not present

## 2023-12-07 DIAGNOSIS — M79671 Pain in right foot: Secondary | ICD-10-CM | POA: Diagnosis not present

## 2023-12-07 MED ORDER — SULFAMETHOXAZOLE-TRIMETHOPRIM 800-160 MG PO TABS: 1.0000 | ORAL_TABLET | Freq: Two times a day (BID) | ORAL | 0 refills | Status: DC

## 2023-12-07 NOTE — Telephone Encounter (Signed)
 Patient's daughter called and made aware of urine culture results and new antibiotic sent to pharmacy.

## 2023-12-07 NOTE — Telephone Encounter (Signed)
-----   Message from Johnie Nailer sent at 12/06/2023  8:51 AM EDT ----- Bactrim DS BID for 7 days ----- Message ----- From: Gar[facility] Lab Results In Sent: 11/28/2023   3:35 PM EDT To: Marco Severs, MD

## 2023-12-10 DIAGNOSIS — G459 Transient cerebral ischemic attack, unspecified: Secondary | ICD-10-CM | POA: Diagnosis not present

## 2023-12-10 DIAGNOSIS — H548 Legal blindness, as defined in USA: Secondary | ICD-10-CM | POA: Diagnosis not present

## 2023-12-10 DIAGNOSIS — M1990 Unspecified osteoarthritis, unspecified site: Secondary | ICD-10-CM | POA: Diagnosis not present

## 2023-12-29 DIAGNOSIS — Z515 Encounter for palliative care: Secondary | ICD-10-CM | POA: Diagnosis not present

## 2023-12-29 DIAGNOSIS — M6281 Muscle weakness (generalized): Secondary | ICD-10-CM | POA: Diagnosis not present

## 2023-12-29 DIAGNOSIS — R296 Repeated falls: Secondary | ICD-10-CM | POA: Diagnosis not present

## 2023-12-29 DIAGNOSIS — Z7409 Other reduced mobility: Secondary | ICD-10-CM | POA: Diagnosis not present

## 2024-01-03 DIAGNOSIS — H548 Legal blindness, as defined in USA: Secondary | ICD-10-CM | POA: Diagnosis not present

## 2024-01-03 DIAGNOSIS — Z299 Encounter for prophylactic measures, unspecified: Secondary | ICD-10-CM | POA: Diagnosis not present

## 2024-01-03 DIAGNOSIS — Z515 Encounter for palliative care: Secondary | ICD-10-CM | POA: Diagnosis not present

## 2024-01-03 DIAGNOSIS — I10 Essential (primary) hypertension: Secondary | ICD-10-CM | POA: Diagnosis not present

## 2024-01-03 DIAGNOSIS — R569 Unspecified convulsions: Secondary | ICD-10-CM | POA: Diagnosis not present

## 2024-01-09 DIAGNOSIS — M1990 Unspecified osteoarthritis, unspecified site: Secondary | ICD-10-CM | POA: Diagnosis not present

## 2024-01-09 DIAGNOSIS — H548 Legal blindness, as defined in USA: Secondary | ICD-10-CM | POA: Diagnosis not present

## 2024-01-09 DIAGNOSIS — G459 Transient cerebral ischemic attack, unspecified: Secondary | ICD-10-CM | POA: Diagnosis not present

## 2024-01-25 DIAGNOSIS — L609 Nail disorder, unspecified: Secondary | ICD-10-CM | POA: Diagnosis not present

## 2024-01-25 DIAGNOSIS — M79671 Pain in right foot: Secondary | ICD-10-CM | POA: Diagnosis not present

## 2024-01-25 DIAGNOSIS — M79674 Pain in right toe(s): Secondary | ICD-10-CM | POA: Diagnosis not present

## 2024-01-25 DIAGNOSIS — M79672 Pain in left foot: Secondary | ICD-10-CM | POA: Diagnosis not present

## 2024-01-25 DIAGNOSIS — I739 Peripheral vascular disease, unspecified: Secondary | ICD-10-CM | POA: Diagnosis not present

## 2024-01-25 DIAGNOSIS — L110 Acquired keratosis follicularis: Secondary | ICD-10-CM | POA: Diagnosis not present

## 2024-01-25 DIAGNOSIS — M79675 Pain in left toe(s): Secondary | ICD-10-CM | POA: Diagnosis not present

## 2024-01-30 DIAGNOSIS — R059 Cough, unspecified: Secondary | ICD-10-CM | POA: Diagnosis not present

## 2024-02-08 DIAGNOSIS — R5383 Other fatigue: Secondary | ICD-10-CM | POA: Diagnosis not present

## 2024-02-08 DIAGNOSIS — E559 Vitamin D deficiency, unspecified: Secondary | ICD-10-CM | POA: Diagnosis not present

## 2024-02-08 DIAGNOSIS — Z0000 Encounter for general adult medical examination without abnormal findings: Secondary | ICD-10-CM | POA: Diagnosis not present

## 2024-02-08 DIAGNOSIS — I10 Essential (primary) hypertension: Secondary | ICD-10-CM | POA: Diagnosis not present

## 2024-02-08 DIAGNOSIS — Z299 Encounter for prophylactic measures, unspecified: Secondary | ICD-10-CM | POA: Diagnosis not present

## 2024-02-08 DIAGNOSIS — Z79899 Other long term (current) drug therapy: Secondary | ICD-10-CM | POA: Diagnosis not present

## 2024-02-08 DIAGNOSIS — E7800 Pure hypercholesterolemia, unspecified: Secondary | ICD-10-CM | POA: Diagnosis not present

## 2024-02-09 DIAGNOSIS — G459 Transient cerebral ischemic attack, unspecified: Secondary | ICD-10-CM | POA: Diagnosis not present

## 2024-02-09 DIAGNOSIS — H548 Legal blindness, as defined in USA: Secondary | ICD-10-CM | POA: Diagnosis not present

## 2024-02-09 DIAGNOSIS — M1990 Unspecified osteoarthritis, unspecified site: Secondary | ICD-10-CM | POA: Diagnosis not present

## 2024-02-10 DIAGNOSIS — Z7409 Other reduced mobility: Secondary | ICD-10-CM | POA: Diagnosis not present

## 2024-02-10 DIAGNOSIS — R296 Repeated falls: Secondary | ICD-10-CM | POA: Diagnosis not present

## 2024-02-10 DIAGNOSIS — Z515 Encounter for palliative care: Secondary | ICD-10-CM | POA: Diagnosis not present

## 2024-02-10 DIAGNOSIS — M6281 Muscle weakness (generalized): Secondary | ICD-10-CM | POA: Diagnosis not present

## 2024-02-16 DIAGNOSIS — S0101XA Laceration without foreign body of scalp, initial encounter: Secondary | ICD-10-CM | POA: Diagnosis not present

## 2024-02-16 DIAGNOSIS — S0181XA Laceration without foreign body of other part of head, initial encounter: Secondary | ICD-10-CM | POA: Diagnosis not present

## 2024-02-16 DIAGNOSIS — S065X0A Traumatic subdural hemorrhage without loss of consciousness, initial encounter: Secondary | ICD-10-CM | POA: Diagnosis not present

## 2024-02-16 DIAGNOSIS — W19XXXA Unspecified fall, initial encounter: Secondary | ICD-10-CM | POA: Diagnosis not present

## 2024-02-16 DIAGNOSIS — Z9989 Dependence on other enabling machines and devices: Secondary | ICD-10-CM | POA: Diagnosis not present

## 2024-02-16 DIAGNOSIS — S199XXA Unspecified injury of neck, initial encounter: Secondary | ICD-10-CM | POA: Diagnosis not present

## 2024-02-16 DIAGNOSIS — I6782 Cerebral ischemia: Secondary | ICD-10-CM | POA: Diagnosis not present

## 2024-02-16 DIAGNOSIS — R519 Headache, unspecified: Secondary | ICD-10-CM | POA: Diagnosis not present

## 2024-02-16 DIAGNOSIS — W1830XA Fall on same level, unspecified, initial encounter: Secondary | ICD-10-CM | POA: Diagnosis not present

## 2024-02-16 DIAGNOSIS — S066XAA Traumatic subarachnoid hemorrhage with loss of consciousness status unknown, initial encounter: Secondary | ICD-10-CM | POA: Diagnosis not present

## 2024-02-16 DIAGNOSIS — S066X1A Traumatic subarachnoid hemorrhage with loss of consciousness of 30 minutes or less, initial encounter: Secondary | ICD-10-CM | POA: Diagnosis not present

## 2024-02-16 DIAGNOSIS — S0990XA Unspecified injury of head, initial encounter: Secondary | ICD-10-CM | POA: Diagnosis not present

## 2024-02-16 DIAGNOSIS — S066X0A Traumatic subarachnoid hemorrhage without loss of consciousness, initial encounter: Secondary | ICD-10-CM | POA: Diagnosis not present

## 2024-02-17 DIAGNOSIS — R451 Restlessness and agitation: Secondary | ICD-10-CM | POA: Diagnosis not present

## 2024-02-25 ENCOUNTER — Emergency Department (HOSPITAL_COMMUNITY)

## 2024-02-25 ENCOUNTER — Encounter (HOSPITAL_COMMUNITY): Payer: Self-pay | Admitting: Emergency Medicine

## 2024-02-25 ENCOUNTER — Other Ambulatory Visit: Payer: Self-pay

## 2024-02-25 ENCOUNTER — Observation Stay (HOSPITAL_COMMUNITY)
Admission: EM | Admit: 2024-02-25 | Discharge: 2024-02-29 | Disposition: A | Discharge status: Skilled Nursing Facility | Source: Skilled Nursing Facility | Attending: Family Medicine | Admitting: Family Medicine

## 2024-02-25 DIAGNOSIS — Z87891 Personal history of nicotine dependence: Secondary | ICD-10-CM | POA: Insufficient documentation

## 2024-02-25 DIAGNOSIS — N179 Acute kidney failure, unspecified: Secondary | ICD-10-CM | POA: Diagnosis not present

## 2024-02-25 DIAGNOSIS — W19XXXA Unspecified fall, initial encounter: Secondary | ICD-10-CM | POA: Diagnosis not present

## 2024-02-25 DIAGNOSIS — Z8673 Personal history of transient ischemic attack (TIA), and cerebral infarction without residual deficits: Secondary | ICD-10-CM | POA: Insufficient documentation

## 2024-02-25 DIAGNOSIS — Z7982 Long term (current) use of aspirin: Secondary | ICD-10-CM | POA: Insufficient documentation

## 2024-02-25 DIAGNOSIS — G20C Parkinsonism, unspecified: Secondary | ICD-10-CM | POA: Insufficient documentation

## 2024-02-25 DIAGNOSIS — N3000 Acute cystitis without hematuria: Secondary | ICD-10-CM | POA: Diagnosis not present

## 2024-02-25 DIAGNOSIS — I10 Essential (primary) hypertension: Secondary | ICD-10-CM | POA: Insufficient documentation

## 2024-02-25 DIAGNOSIS — S066X0A Traumatic subarachnoid hemorrhage without loss of consciousness, initial encounter: Secondary | ICD-10-CM | POA: Diagnosis not present

## 2024-02-25 DIAGNOSIS — H547 Unspecified visual loss: Secondary | ICD-10-CM | POA: Diagnosis not present

## 2024-02-25 DIAGNOSIS — G40909 Epilepsy, unspecified, not intractable, without status epilepticus: Secondary | ICD-10-CM | POA: Insufficient documentation

## 2024-02-25 DIAGNOSIS — R8281 Pyuria: Secondary | ICD-10-CM | POA: Insufficient documentation

## 2024-02-25 DIAGNOSIS — Z9181 History of falling: Secondary | ICD-10-CM | POA: Diagnosis not present

## 2024-02-25 DIAGNOSIS — S0990XA Unspecified injury of head, initial encounter: Secondary | ICD-10-CM | POA: Diagnosis not present

## 2024-02-25 DIAGNOSIS — R2681 Unsteadiness on feet: Secondary | ICD-10-CM | POA: Insufficient documentation

## 2024-02-25 DIAGNOSIS — F0390 Unspecified dementia without behavioral disturbance: Secondary | ICD-10-CM | POA: Diagnosis not present

## 2024-02-25 DIAGNOSIS — G3184 Mild cognitive impairment, so stated: Secondary | ICD-10-CM | POA: Diagnosis not present

## 2024-02-25 DIAGNOSIS — R2689 Other abnormalities of gait and mobility: Secondary | ICD-10-CM | POA: Insufficient documentation

## 2024-02-25 DIAGNOSIS — Z8669 Personal history of other diseases of the nervous system and sense organs: Secondary | ICD-10-CM | POA: Diagnosis not present

## 2024-02-25 DIAGNOSIS — E782 Mixed hyperlipidemia: Secondary | ICD-10-CM | POA: Insufficient documentation

## 2024-02-25 DIAGNOSIS — S0083XA Contusion of other part of head, initial encounter: Secondary | ICD-10-CM | POA: Diagnosis not present

## 2024-02-25 DIAGNOSIS — R569 Unspecified convulsions: Secondary | ICD-10-CM

## 2024-02-25 DIAGNOSIS — M6281 Muscle weakness (generalized): Secondary | ICD-10-CM | POA: Insufficient documentation

## 2024-02-25 DIAGNOSIS — Z87898 Personal history of other specified conditions: Secondary | ICD-10-CM

## 2024-02-25 DIAGNOSIS — R55 Syncope and collapse: Secondary | ICD-10-CM | POA: Diagnosis not present

## 2024-02-25 DIAGNOSIS — R000 Tachycardia, unspecified: Secondary | ICD-10-CM | POA: Diagnosis not present

## 2024-02-25 DIAGNOSIS — S0181XA Laceration without foreign body of other part of head, initial encounter: Secondary | ICD-10-CM | POA: Diagnosis not present

## 2024-02-25 DIAGNOSIS — I609 Nontraumatic subarachnoid hemorrhage, unspecified: Secondary | ICD-10-CM

## 2024-02-25 LAB — CBC WITH DIFFERENTIAL/PLATELET
Abs Immature Granulocytes: 0.01 K/uL (ref 0.00–0.07)
Basophils Absolute: 0 K/uL (ref 0.0–0.1)
Basophils Relative: 1 %
Eosinophils Absolute: 0 K/uL (ref 0.0–0.5)
Eosinophils Relative: 0 %
HCT: 43 % (ref 36.0–46.0)
Hemoglobin: 14 g/dL (ref 12.0–15.0)
Immature Granulocytes: 0 %
Lymphocytes Relative: 8 %
Lymphs Abs: 0.5 K/uL — ABNORMAL LOW (ref 0.7–4.0)
MCH: 30 pg (ref 26.0–34.0)
MCHC: 32.6 g/dL (ref 30.0–36.0)
MCV: 92.3 fL (ref 80.0–100.0)
Monocytes Absolute: 0.4 K/uL (ref 0.1–1.0)
Monocytes Relative: 6 %
Neutro Abs: 5.5 K/uL (ref 1.7–7.7)
Neutrophils Relative %: 85 %
Platelets: 249 K/uL (ref 150–400)
RBC: 4.66 MIL/uL (ref 3.87–5.11)
RDW: 13.1 % (ref 11.5–15.5)
WBC: 6.5 K/uL (ref 4.0–10.5)
nRBC: 0 % (ref 0.0–0.2)

## 2024-02-25 LAB — COMPREHENSIVE METABOLIC PANEL WITH GFR
ALT: 14 U/L (ref 0–44)
AST: 19 U/L (ref 15–41)
Albumin: 4.3 g/dL (ref 3.5–5.0)
Alkaline Phosphatase: 55 U/L (ref 38–126)
Anion gap: 12 (ref 5–15)
BUN: 53 mg/dL — ABNORMAL HIGH (ref 8–23)
CO2: 24 mmol/L (ref 22–32)
Calcium: 9.5 mg/dL (ref 8.9–10.3)
Chloride: 103 mmol/L (ref 98–111)
Creatinine, Ser: 1.2 mg/dL — ABNORMAL HIGH (ref 0.44–1.00)
GFR, Estimated: 45 mL/min — ABNORMAL LOW (ref >60)
Glucose, Bld: 105 mg/dL — ABNORMAL HIGH (ref 70–99)
Potassium: 4 mmol/L (ref 3.5–5.1)
Sodium: 139 mmol/L (ref 135–145)
Total Bilirubin: 1.5 mg/dL — ABNORMAL HIGH (ref 0.0–1.2)
Total Protein: 6.8 g/dL (ref 6.5–8.1)

## 2024-02-25 MED ORDER — MUPIROCIN 2 % EX OINT
TOPICAL_OINTMENT | Freq: Two times a day (BID) | CUTANEOUS | Status: DC
  Filled 2024-02-25 (×3): qty 22

## 2024-02-25 MED ORDER — MUPIROCIN 2 % EX OINT: 1.0000 | TOPICAL_OINTMENT | Freq: Two times a day (BID) | CUTANEOUS | 0 refills | Status: DC

## 2024-02-25 MED ORDER — LEVETIRACETAM 250 MG PO TABS: 250.0000 mg | ORAL_TABLET | Freq: Two times a day (BID) | ORAL | 0 refills | Status: DC

## 2024-02-25 MED ORDER — LEVETIRACETAM (KEPPRA) 500 MG/5 ML ADULT IV PUSH
1000.0000 mg | Freq: Once | INTRAVENOUS | Status: AC
  Administered 2024-02-25: 1000 mg via INTRAVENOUS
  Filled 2024-02-25: qty 10

## 2024-02-25 NOTE — ED Triage Notes (Signed)
 Pt from [name] after falling out of a chair face first onto floor. Pt with laceration to forehead area. Unknown whether pt is on any blood thinners as facility did not send medication list with her. Pt with hx of dementia.

## 2024-02-25 NOTE — ED Provider Notes (Signed)
 [place] EMERGENCY DEPARTMENT AT [facility] Provider Note   CSN: [id number] Arrival date & time: 02/25/24  2210     Patient presents with: Fall and Laceration   Anna Jacobs is a 83 y.o. female.    Fall  Laceration  This patient is a 83 year old female, according to the medical record the patient has a history of seizure disorder that was recently diagnosed within the last year and started on Keppra , she has a history of recurrent urinary tract infections and a history of hypertension and hyperlipidemia.  She comes from a nursing facility where she has been for the last 2 weeks although the report is that she does not have any medications on record with them.  The nurse called the facility and they stated that they did not have any more information.  I can access information from the outside medical record where the patient was admitted to another hospital in April of this year and at which time she was started on Keppra .  She was also seen at the urology office in June and had a urinary tract infection.  The patient is not able to give me any information.  The patient had a fall evidently out of a chair and went face first onto the floor, she suffered a laceration of her forehead on the right, the patient is not able to give me any information, evidently she does have a history of dementia.  I tried to call all 3 family members on record here with the demographics at the medical record here at the hospital and nobody had a phone number that was still connected.    Prior to Admission medications   Medication Sig Start Date End Date Taking? Authorizing Provider  aspirin  81 MG chewable tablet Chew by mouth daily.    [provider]  Cholecalciferol (VITAMIN D) 50 MCG (2000 UT) tablet Take 2,000 Units by mouth daily.    [provider]  docusate sodium  (COLACE) 100 MG capsule Take 1 capsule (100 mg total) by mouth 2 (two) times daily. Take as needed for  hard stools 02/15/22   Summerlin, Julienne Annette, PA-C  escitalopram  (LEXAPRO ) 20 MG tablet Take 20 mg by mouth daily. Patient not taking: Reported on 09/03/2022    [provider]  lactose free nutrition (BOOST) LIQD Take 237 mLs by mouth 2 (two) times daily between meals. 02/23/19   Teressa Toribio SQUIBB, MD  levETIRAcetam  (KEPPRA ) 250 MG tablet Take 250 mg by mouth 2 (two) times daily. 11/21/23   [provider]  nitrofurantoin  (MACRODANTIN ) 50 MG capsule Take 1 capsule (50 mg total) by mouth at bedtime. 11/28/23   McKenzie, Belvie CROME, MD  nitrofurantoin , macrocrystal-monohydrate, (MACROBID ) 100 MG capsule Take 1 capsule (100 mg total) by mouth every 12 (twelve) hours. 11/28/23   McKenzie, Belvie CROME, MD  sulfamethoxazole -trimethoprim  (BACTRIM  DS) 800-160 MG tablet Take 1 tablet by mouth 2 (two) times daily. 12/07/23   McKenzie, Belvie CROME, MD  vitamin B-12 (CYANOCOBALAMIN ) 500 MCG tablet Take 500 mcg by mouth daily.    [provider]    Allergies: Metronidazole and Flagyl [metronidazole hcl]    Review of Systems  All other systems reviewed and are negative.   Updated Vital Signs BP (!) 130/96 (BP Location: Left Arm)   Pulse (!) 102   Temp 99 F (37.2 C) (Axillary)   Resp 20   Ht 1.549 m (5' 1)   Wt 66.3 kg   SpO2 98%  BMI 27.62 kg/m   Physical Exam Vitals and nursing note reviewed.  Constitutional:      Appearance: She is well-developed.  HENT:     Head: Normocephalic.     Comments: Laceration to the right forehead    Mouth/Throat:     Mouth: Mucous membranes are moist.     Pharynx: No oropharyngeal exudate.  Eyes:     General: No scleral icterus.       Right eye: No discharge.        Left eye: No discharge.     Conjunctiva/sclera: Conjunctivae normal.     Pupils: Pupils are equal, round, and reactive to light.  Neck:     Thyroid : No thyromegaly.     Vascular: No JVD.  Cardiovascular:     Rate and Rhythm: Normal rate and regular rhythm.     Heart  sounds: Normal heart sounds. No murmur heard.    No friction rub. No gallop.  Pulmonary:     Effort: Pulmonary effort is normal. No respiratory distress.     Breath sounds: Normal breath sounds. No wheezing or rales.  Abdominal:     General: Bowel sounds are normal. There is no distension.     Palpations: Abdomen is soft. There is no mass.     Tenderness: There is no abdominal tenderness.  Musculoskeletal:        General: No tenderness. Normal range of motion.     Cervical back: Normal range of motion and neck supple.     Right lower leg: No edema.     Left lower leg: No edema.  Lymphadenopathy:     Cervical: No cervical adenopathy.  Skin:    General: Skin is warm and dry.     Findings: No erythema or rash.     Comments: 4 cm laceration to the right forehead, mildly gaping and mildly bleeding, no underlying bony abnormalities, no injury to the nose or the mouth  Neurological:     Mental Status: She is alert.     Coordination: Coordination normal.     Comments: The patient is able to curse eloquently, she is able to move all 4 extremities and is pushing people away with both arms and legs when we are trying to examine her.  She will not answer questions without calling me a goddamn bitch    Psychiatric:        Behavior: Behavior normal.     (all labs ordered are listed, but only abnormal results are displayed) Labs Reviewed  URINALYSIS, ROUTINE W REFLEX MICROSCOPIC  CBC WITH DIFFERENTIAL/PLATELET  COMPREHENSIVE METABOLIC PANEL WITH GFR    EKG: None  Radiology: No results found.   .Laceration Repair  Date/Time: 02/25/2024 10:51 PM  Performed by: Cleotilde Rogue, MD Authorized by: Cleotilde Rogue, MD   Consent:    Consent obtained:  Emergent situation Universal protocol:    Immediately prior to procedure, a time out was called: yes     Patient identity confirmed:  Hospital-assigned identification number and provided demographic data Anesthesia:    Anesthesia method:   Local infiltration   Local anesthetic:  Lidocaine  1% WITH epi Laceration details:    Location:  Face   Face location:  Forehead   Length (cm):  4   Depth (mm):  2 Pre-procedure details:    Preparation:  Patient was prepped and draped in usual sterile fashion Exploration:    Limited defect created (wound extended): no     Hemostasis achieved with:  Direct pressure  Wound exploration: wound explored through full range of motion and entire depth of wound visualized     Wound extent: fascia not violated, no foreign body, no signs of injury, no nerve damage, no tendon damage, no underlying fracture and no vascular damage     Contaminated: no   Treatment:    Area cleansed with:  Povidone-iodine  and saline   Amount of cleaning:  Extensive   Irrigation solution:  Sterile saline   Irrigation volume:  50   Debridement:  None   Undermining:  None Skin repair:    Repair method:  Sutures   Suture size:  5-0   Wound skin closure material used: ethilon.   Suture technique:  Simple interrupted   Number of sutures:  5 Approximation:    Approximation:  Close Repair type:    Repair type:  Intermediate Post-procedure details:    Dressing:  Antibiotic ointment and sterile dressing   Procedure completion:  Tolerated with difficulty Comments:     Patient tolerated this procedure with difficulty as she was combative and swinging at the provider and assistants throughout.     Medications Ordered in the ED - No data to display                                  Medical Decision Making Amount and/or Complexity of Data Reviewed Labs: ordered. Radiology: ordered. ECG/medicine tests: ordered.  Risk Prescription drug management.    This patient presents to the ED for concern of fall with a head injury differential diagnosis includes could have been a primary fall from a chair but the patient has seizure disorder and evidently is not on any medication at this time.  Will load with Keppra  while  obtaining CT scan.  Laceration was repaired on arrival with 5 sutures of 5-0 Ethilon    Additional history obtained   Additional history obtained from Electronic Medical Record External records from outside source obtained and reviewed including medical record, prior admission to outside hospital   At change of shift - care signed out to Dr. Theadore, to f/u results and dispo accordingly   Medicines ordered and prescription drug management:  I ordered medication including Keppra , mupirocin , dressing    I have reviewed the patients home medicines and have made adjustments as needed   Problem List / ED Course:  Evidently there was no family available to take the call, all 3 phone numbers either went to voicemail or were disconnected.   Social Determinants of Health:  Dementia, nursing home        Final diagnoses:  None    ED Discharge Orders     None          Cleotilde Rogue, MD 02/25/24 [phone]

## 2024-02-25 NOTE — ED Provider Notes (Signed)
  Provider Note MRN:  [id number]  Arrival date & time: 02/26/24    ED Course and Medical Decision Making  Assumed care of patient at sign-out or upon transfer.  History of dementia here with fall at care facility.  Laceration was repaired.  Awaiting labs, CT imaging.  Reportedly history of seizures but this new care facility does not have her med list on file.  Also history of frequent UTIs.  12:40 AM update: Radiology called me with CT head results, trace traumatic subarachnoid hemorrhage.  I spoke with neurology regarding this injury.  Per Dr. Colon, no need for repeat imaging, from a neurosurgery perspective patient can be discharged.  Remainder of workup reveals urinary tract infection, mild AKI.  Given this in the social concerns will request hospitalist admission.  Procedures  Final Clinical Impressions(s) / ED Diagnoses     ICD-10-CM   1. Facial laceration, initial encounter  S01.81XA     2. SAH (subarachnoid hemorrhage) (HCC)  I60.9     3. Acute cystitis without hematuria  N30.00     4. AKI (acute kidney injury) (HCC)  N17.9       ED Discharge Orders          Ordered    levETIRAcetam  (KEPPRA ) 250 MG tablet  2 times daily        02/25/24 2300    mupirocin  ointment (BACTROBAN ) 2 %  2 times daily        02/25/24 2300            Discharge Instructions   None     Anna Jacobs. Theadore, MD [facility] Emergency Medicine [facility] [email or web address]    Anna Jacobs Anna HERO, MD 02/26/24 [phone]

## 2024-02-26 DIAGNOSIS — I609 Nontraumatic subarachnoid hemorrhage, unspecified: Secondary | ICD-10-CM

## 2024-02-26 DIAGNOSIS — N3000 Acute cystitis without hematuria: Secondary | ICD-10-CM | POA: Diagnosis present

## 2024-02-26 DIAGNOSIS — W19XXXA Unspecified fall, initial encounter: Secondary | ICD-10-CM | POA: Diagnosis not present

## 2024-02-26 DIAGNOSIS — N179 Acute kidney failure, unspecified: Secondary | ICD-10-CM | POA: Insufficient documentation

## 2024-02-26 DIAGNOSIS — S0181XA Laceration without foreign body of other part of head, initial encounter: Secondary | ICD-10-CM

## 2024-02-26 DIAGNOSIS — Y92009 Unspecified place in unspecified non-institutional (private) residence as the place of occurrence of the external cause: Secondary | ICD-10-CM | POA: Diagnosis not present

## 2024-02-26 DIAGNOSIS — Z87898 Personal history of other specified conditions: Secondary | ICD-10-CM | POA: Diagnosis not present

## 2024-02-26 DIAGNOSIS — S066X0A Traumatic subarachnoid hemorrhage without loss of consciousness, initial encounter: Secondary | ICD-10-CM | POA: Diagnosis not present

## 2024-02-26 LAB — COMPREHENSIVE METABOLIC PANEL WITH GFR
ALT: 14 U/L (ref 0–44)
AST: 17 U/L (ref 15–41)
Albumin: 3.9 g/dL (ref 3.5–5.0)
Alkaline Phosphatase: 53 U/L (ref 38–126)
Anion gap: 9 (ref 5–15)
BUN: 47 mg/dL — ABNORMAL HIGH (ref 8–23)
CO2: 22 mmol/L (ref 22–32)
Calcium: 9 mg/dL (ref 8.9–10.3)
Chloride: 106 mmol/L (ref 98–111)
Creatinine, Ser: 0.96 mg/dL (ref 0.44–1.00)
GFR, Estimated: 59 mL/min — ABNORMAL LOW (ref >60)
Glucose, Bld: 87 mg/dL (ref 70–99)
Potassium: 3.5 mmol/L (ref 3.5–5.1)
Sodium: 137 mmol/L (ref 135–145)
Total Bilirubin: 1.2 mg/dL (ref 0.0–1.2)
Total Protein: 6.6 g/dL (ref 6.5–8.1)

## 2024-02-26 LAB — CBC
HCT: 43 % (ref 36.0–46.0)
Hemoglobin: 13.9 g/dL (ref 12.0–15.0)
MCH: 29.8 pg (ref 26.0–34.0)
MCHC: 32.3 g/dL (ref 30.0–36.0)
MCV: 92.3 fL (ref 80.0–100.0)
Platelets: 215 K/uL (ref 150–400)
RBC: 4.66 MIL/uL (ref 3.87–5.11)
RDW: 13.1 % (ref 11.5–15.5)
WBC: 7 K/uL (ref 4.0–10.5)
nRBC: 0 % (ref 0.0–0.2)

## 2024-02-26 LAB — URINALYSIS, ROUTINE W REFLEX MICROSCOPIC
Bilirubin Urine: NEGATIVE
Glucose, UA: NEGATIVE mg/dL
Ketones, ur: 5 mg/dL — AB
Nitrite: POSITIVE — AB
Protein, ur: 100 mg/dL — AB
Specific Gravity, Urine: 1.024 (ref 1.005–1.030)
pH: 5 (ref 5.0–8.0)

## 2024-02-26 LAB — MAGNESIUM: Magnesium: 2.3 mg/dL (ref 1.7–2.4)

## 2024-02-26 LAB — VITAMIN B12: Vitamin B-12: 1890 pg/mL — ABNORMAL HIGH (ref 180–914)

## 2024-02-26 LAB — FOLATE: Folate: 11.8 ng/mL (ref >5.9)

## 2024-02-26 LAB — TSH: TSH: 0.411 u[IU]/mL (ref 0.350–4.500)

## 2024-02-26 LAB — CK: Total CK: 162 U/L (ref 38–234)

## 2024-02-26 LAB — T4, FREE: Free T4: 1.16 ng/dL — ABNORMAL HIGH (ref 0.61–1.12)

## 2024-02-26 LAB — PHOSPHORUS: Phosphorus: 3 mg/dL (ref 2.5–4.6)

## 2024-02-26 MED ORDER — LEVETIRACETAM (KEPPRA) 500 MG/5 ML ADULT IV PUSH
500.0000 mg | INTRAVENOUS | Status: DC
  Administered 2024-02-26 – 2024-02-27 (×2): 500 mg via INTRAVENOUS
  Filled 2024-02-26 (×2): qty 5

## 2024-02-26 MED ORDER — CHLORHEXIDINE GLUCONATE CLOTH 2 % EX PADS
6.0000 | MEDICATED_PAD | Freq: Every day | CUTANEOUS | Status: DC
  Administered 2024-02-26 – 2024-02-28 (×3): 6 via TOPICAL

## 2024-02-26 MED ORDER — SODIUM CHLORIDE 0.9 % IV SOLN
1.0000 g | Freq: Once | INTRAVENOUS | Status: AC
  Administered 2024-02-26: 1 g via INTRAVENOUS
  Filled 2024-02-26: qty 10

## 2024-02-26 MED ORDER — ACETAMINOPHEN 650 MG RE SUPP: 650.0000 mg | Freq: Four times a day (QID) | RECTAL | Status: DC | PRN

## 2024-02-26 MED ORDER — POTASSIUM CHLORIDE IN NACL 20-0.9 MEQ/L-% IV SOLN: INTRAVENOUS | Status: AC

## 2024-02-26 MED ORDER — ONDANSETRON HCL 4 MG PO TABS: 4.0000 mg | ORAL_TABLET | Freq: Four times a day (QID) | ORAL | Status: DC | PRN

## 2024-02-26 MED ORDER — ACETAMINOPHEN 325 MG PO TABS: 650.0000 mg | ORAL_TABLET | Freq: Four times a day (QID) | ORAL | Status: DC | PRN

## 2024-02-26 MED ORDER — SODIUM CHLORIDE 0.9 % IV SOLN
1.0000 g | INTRAVENOUS | Status: DC
  Administered 2024-02-26 – 2024-02-28 (×3): 1 g via INTRAVENOUS
  Filled 2024-02-26 (×4): qty 10

## 2024-02-26 MED ORDER — LACTATED RINGERS IV SOLN: INTRAVENOUS | Status: DC

## 2024-02-26 MED ORDER — ONDANSETRON HCL 4 MG/2ML IJ SOLN
4.0000 mg | Freq: Four times a day (QID) | INTRAMUSCULAR | Status: DC | PRN
Start: 2024-02-26 — End: 2024-02-29

## 2024-02-26 NOTE — Progress Notes (Addendum)
 Pt admitted to [facility] from the ED and moved from gurney to bed by staff. Unable to obtain full VS due to combativeness. Pt agitated, grabbing, and cursing at staff. Mittens applied and physical needs met. Continuous fluids running at 81mL/hr. Yellow armband, floor mats placed for safety. ED reported that pt was In and out cathed at 2330 and then placed on purewick with no output since. Notified patient's daughter Anna Jacobs of patient's room number and stable status.

## 2024-02-26 NOTE — Care Management Obs Status (Signed)
 MEDICARE OBSERVATION STATUS NOTIFICATION   Patient Details  Name: Anna Jacobs MRN: [id number] Date of Birth: 09-27-1940   Medicare Observation Status Notification Given:  Yes    Nena LITTIE Coffee, RN 02/26/2024, 6:15 PM

## 2024-02-26 NOTE — Therapy (Signed)
 Arrived for PT evaluation; patient quite upset when any female speaks to her; she visibly calms when her son in law speaks to her but she is too agitated to complete therapy evaluation today.  Daughter and son in law at bedside. Will attempt again when appropriate  3:24 PM, 02/26/24 Tamina Cyphers Small Davone Shinault MPT [facility] [place] [phone]

## 2024-02-26 NOTE — H&P (Signed)
 History and Physical    Patient: Anna Jacobs FMW:[id number] DOB: [birth year] DOA: 02/25/2024 DOS: the patient was seen and examined on 02/26/2024 PCP: Trudy Vaughn FALCON, MD  Patient coming from: SNF  Chief Complaint:  Chief Complaint  Patient presents with   Fall   Laceration   HPI: Anna Jacobs is an 83 y.o. female with medical history significant of hypertension, hyperlipidemia, dementia, seizure, recurrent UTI who presents to the emergency department from SNF via EMS due to a fall.  At bedside, patient was unable to provide history possibly due to being  somnolent, though arousable, but quickly goes back to sleep, she also has a history of dementia.  History was obtained from EDP and ED medical record.  Per report, patient fell out of chair and landed with face first onto the floor and she sustained laceration of right sided forehead. Apparently, patient was recently admitted to the skilled nursing facility about 2 weeks ago and it was unknown to the nursing staff that patient has home medications, so she has not received any medication since she arrived to this facility.  ED Course:  In the emergency department, BP 130/96, pulse 102 bpm, other vital signs are within normal range.  Workup in the ED showed normal CBC and BMP except for BUN/creatinine 53/1.20 (most recent lab for comparison was 2 years ago with creatinine at 1.0).  Urinalysis was positive for UTI. CT head without contrast showed trace acute subarachnoid hemorrhage in a high right parietal sulcus. No mass effect. CT cervical spine showed no evidence of acute fracture or traumatic malalignment in the cervical spine. Neurologist (Dr. Colon) consulted stated there was no need for repeat imaging per EDP.  Neurosurgeon consulted and states patient can be discharged per EDP medical record.  Suture of laceration of right forehead was done.  She was treated with IV ceftriaxone  for UTI, IV Keppra  1000 mg was given.  [facility] was asked  to admit patient.  Review of Systems: Review of systems as noted in the HPI. All other systems reviewed and are negative.   Past Medical History:  Diagnosis Date   Allergy    Anxiety    B12 deficiency    Degenerative joint disease    Depression    GERD (gastroesophageal reflux disease)    EGD 07/02/2003 Dr. Shaaron   Hearing loss    Hyperlipemia    Hypertension    IBS (irritable bowel syndrome)    Colonoscopy normal Dr. Shaaron 06/2003   Insomnia    Parkinson's disease 2001   Peripheral neuropathy    Restless leg syndrome    RP (retinitis pigmentosa)    Sleep apnea    TIA (transient ischemic attack)    Urinary incontinence    Past Surgical History:  Procedure Laterality Date   Benign breast biopsy     left (remotely) and right (06/2012)   BIOPSY N/A 11/29/2012   Procedure: BIOPSY;  Surgeon: Lamar CHRISTELLA Shaaron, MD;  Location: [facility];  Service: Endoscopy;  Laterality: N/A;  RANDOM BIOPSY   CHOLECYSTECTOMY  2000   COLONOSCOPY N/A 11/29/2012   MFM:Ipfpwlupcz rectal polyps-removed Otherwise normal examination distal terminal ileum appeared normal. Random colon biopsies were negative for microscopic colitis. Rectum polyp, tubular adenoma. Next TCS 11/2019.   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 She has never used smokeless tobacco. She reports that she does not drink alcohol and does not use drugs.   Allergies  Allergen Reactions   Metronidazole    Flagyl [Metronidazole Hcl] Itching and Rash    Family History  Problem Relation Age of Onset   Lymphoma Mother    Coronary artery disease Father    Esophageal cancer Brother    Lung cancer Brother    Ovarian cancer Daughter 46   Colon cancer Neg Hx    Rectal cancer Neg Hx    Stomach  cancer Neg Hx    Parkinson's disease Neg Hx      Prior to Admission medications   Medication Sig Start Date End Date Taking? Authorizing Provider  levETIRAcetam  (KEPPRA ) 250 MG tablet Take 1 tablet (250 mg total) by mouth 2 (two) times daily. 02/25/24 04/25/24 Yes Cleotilde Rogue, MD  mupirocin  ointment (BACTROBAN ) 2 % Apply 1 Application topically 2 (two) times daily. 02/25/24  Yes Cleotilde Rogue, MD  aspirin  81 MG chewable tablet Chew by mouth daily.    [provider]  Cholecalciferol (VITAMIN D) 50 MCG (2000 UT) tablet Take 2,000 Units by mouth daily.    [provider]  docusate sodium  (COLACE) 100 MG capsule Take 1 capsule (100 mg total) by mouth 2 (two) times daily. Take as needed for hard stools 02/15/22   Summerlin, Julienne Annette, PA-C  escitalopram  (LEXAPRO ) 20 MG tablet Take 20 mg by mouth daily. Patient not taking: Reported on 09/03/2022    [provider]  lactose free nutrition (BOOST) LIQD Take 237 mLs by mouth 2 (two) times daily between meals. 02/23/19   Teressa Toribio SQUIBB, MD  nitrofurantoin  (MACRODANTIN ) 50 MG capsule Take 1 capsule (50 mg total) by mouth at bedtime. 11/28/23   McKenzie, Belvie CROME, MD  nitrofurantoin , macrocrystal-monohydrate, (MACROBID ) 100 MG capsule Take 1 capsule (100 mg total) by mouth every 12 (twelve) hours. 11/28/23   McKenzie, Belvie CROME, MD  sulfamethoxazole -trimethoprim  (BACTRIM  DS) 800-160 MG tablet Take 1 tablet by mouth 2 (two) times daily. 12/07/23   McKenzie, Belvie CROME, MD  vitamin B-12 (CYANOCOBALAMIN ) 500 MCG tablet Take 500 mcg by mouth daily.    [provider]    Physical Exam: BP 134/71   Pulse (!) 102   Temp 99 F (37.2 C) (Axillary)   Resp 10   Ht 5' 1 (1.549 m)   Wt 66.3 kg   SpO2 98%   BMI 27.62 kg/m   General: 83 y.o. year-old female somnolent, though easily arousable, but quickly goes back to sleep and was in no acute distress.  HEENT: EOMI, suture of laceration to the right forehead Neck: Supple,  trachea medial Cardiovascular: Regular rate and rhythm with no rubs or gallops.  No thyromegaly or JVD noted.  No lower extremity edema. 2/4 pulses in all 4 extremities. Respiratory: Clear to auscultation with no wheezes or rales. Good inspiratory effort. Abdomen: Soft, nontender nondistended with normal bowel sounds x4 quadrants. Muskuloskeletal: No cyanosis, clubbing or edema noted bilaterally Neuro: No focal neurologic deficit sensation, reflexes intact Skin: No ulcerative lesions noted or rashes Psychiatry: This cannot be assessed at this time due to patient's current condition         Labs on Admission:  Basic Metabolic Panel: Recent Labs  Lab 02/25/24 2325  NA 139  K 4.0  CL 103  CO2 24  GLUCOSE 105*  BUN 53*  CREATININE 1.20*  CALCIUM  9.5  Liver Function Tests: Recent Labs  Lab 02/25/24 2325  AST 19  ALT 14  ALKPHOS 55  BILITOT 1.5*  PROT 6.8  ALBUMIN 4.3   No results for input(s): LIPASE, AMYLASE in the last 168 hours. No results for input(s): AMMONIA in the last 168 hours. CBC: Recent Labs  Lab 02/25/24 2325  WBC 6.5  NEUTROABS 5.5  HGB 14.0  HCT 43.0  MCV 92.3  PLT 249   Cardiac Enzymes: No results for input(s): CKTOTAL, CKMB, CKMBINDEX, TROPONINI in the last 168 hours.  BNP (last 3 results) No results for input(s): BNP in the last 8760 hours.  ProBNP (last 3 results) No results for input(s): PROBNP in the last 8760 hours.  CBG: No results for input(s): GLUCAP in the last 168 hours.  Radiological Exams on Admission: CT Head Wo Contrast Result Date: 02/25/2024 CLINICAL DATA:  Facial trauma, blunt EXAM: CT HEAD WITHOUT CONTRAST CT CERVICAL SPINE WITHOUT CONTRAST TECHNIQUE: Multidetector CT imaging of the head and cervical spine was performed following the standard protocol without intravenous contrast. Multiplanar CT image reconstructions of the cervical spine were also generated. RADIATION DOSE REDUCTION: This exam was  performed according to the departmental dose-optimization program which includes automated exposure control, adjustment of the mA and/or kV according to patient size and/or use of iterative reconstruction technique. COMPARISON:  August 07, 2023 CT head and CT cervical spine. FINDINGS: CT HEAD FINDINGS Brain: Trace subarachnoid hemorrhage in a high right parietal sulcus (see series 3, image 14 and series 6, image 53). No evidence of acute large vascular territory infarct, hydrocephalus, extra-axial collection or mass lesion/mass effect. Patchy white matterHypodensities are nonspecific but compatible with at least moderate chronic microvascular ischemic change. Cerebral atrophy. Vascular: Calcific atherosclerosis.  No hyperdense vessel. Skull: No acute fracture.  Left forehead contusion Sinuses/Orbits: Clear sinuses.  No acute orbital findings. CT CERVICAL SPINE FINDINGS Alignment: Mild grade 1 anterolisthesis of C4 on C5 and C7 on T1 is most likely degenerative in etiology given facet arthropathy at these levels. Otherwise, no substantial sagittal subluxation. Skull base and vertebrae: No evidence of acute fracture. Vertebral body heights are maintained. Soft tissues and spinal canal: No prevertebral fluid or swelling. No visible canal hematoma. Disc levels: Multilevel degenerative disc disease including disc height loss and endplate spurring. Facet and uncovertebral hypertrophy contributes to varying degrees of neural foraminal stenosis, likely severe on the right at C4-C5. Upper chest: Visualized lung apices are clear. IMPRESSION: 1. Trace acute subarachnoid hemorrhage in a high right parietal sulcus. No mass effect. 2. No evidence of acute fracture or traumatic malalignment in the cervical spine. 3. Multilevel degenerative change including likely severe foraminal stenosis on the right at C4-C5. MRI could further characterize if clinically warranted. Findings discussed with Dr. Theadore via telephone at 11:21 PM.  Electronically Signed   By: Gilmore GORMAN Molt M.D.   On: 02/25/2024 23:23   CT Cervical Spine Wo Contrast Result Date: 02/25/2024 CLINICAL DATA:  Facial trauma, blunt EXAM: CT HEAD WITHOUT CONTRAST CT CERVICAL SPINE WITHOUT CONTRAST TECHNIQUE: Multidetector CT imaging of the head and cervical spine was performed following the standard protocol without intravenous contrast. Multiplanar CT image reconstructions of the cervical spine were also generated. RADIATION DOSE REDUCTION: This exam was performed according to the departmental dose-optimization program which includes automated exposure control, adjustment of the mA and/or kV according to patient size and/or use of iterative reconstruction technique. COMPARISON:  August 07, 2023 CT head and CT cervical spine. FINDINGS: CT HEAD FINDINGS Brain: Trace subarachnoid  hemorrhage in a high right parietal sulcus (see series 3, image 14 and series 6, image 53). No evidence of acute large vascular territory infarct, hydrocephalus, extra-axial collection or mass lesion/mass effect. Patchy white matterHypodensities are nonspecific but compatible with at least moderate chronic microvascular ischemic change. Cerebral atrophy. Vascular: Calcific atherosclerosis.  No hyperdense vessel. Skull: No acute fracture.  Left forehead contusion Sinuses/Orbits: Clear sinuses.  No acute orbital findings. CT CERVICAL SPINE FINDINGS Alignment: Mild grade 1 anterolisthesis of C4 on C5 and C7 on T1 is most likely degenerative in etiology given facet arthropathy at these levels. Otherwise, no substantial sagittal subluxation. Skull base and vertebrae: No evidence of acute fracture. Vertebral body heights are maintained. Soft tissues and spinal canal: No prevertebral fluid or swelling. No visible canal hematoma. Disc levels: Multilevel degenerative disc disease including disc height loss and endplate spurring. Facet and uncovertebral hypertrophy contributes to varying degrees of neural  foraminal stenosis, likely severe on the right at C4-C5. Upper chest: Visualized lung apices are clear. IMPRESSION: 1. Trace acute subarachnoid hemorrhage in a high right parietal sulcus. No mass effect. 2. No evidence of acute fracture or traumatic malalignment in the cervical spine. 3. Multilevel degenerative change including likely severe foraminal stenosis on the right at C4-C5. MRI could further characterize if clinically warranted. Findings discussed with Dr. Theadore via telephone at 11:21 PM. Electronically Signed   By: Gilmore GORMAN Molt M.D.   On: 02/25/2024 23:23    EKG: I independently viewed the EKG done and my findings are as followed: Sinus arrhythmia at a rate of 84 bpm with VPC and LVH.  Assessment/Plan Present on Admission:  Acute cystitis  Principal Problem:   Acute cystitis Active Problems:   Fall at home, initial encounter   AKI (acute kidney injury) (HCC)   Subarachnoid hemorrhage (HCC)   History of seizures  Acute cystitis Patient was started on IV ceftriaxone , we shall continue with same at this time Urine culture pending  Fall at home Continue fall precaution Continue PT/OT eval and treat  Laceration to right forehead Patient with sutured laceration to forehead Continue wound care  Acute kidney injury BUN/creatinine 53/1.20 (most recent lab for comparison was 2 years ago with creatinine at 1.0) Continue gentle hydration Renally adjust medications, avoid nephrotoxic agents/dehydration/hypotension  Subarachnoid hemorrhage Stable, neurologist does not think there was need for repeat CT and neurosurgery consulted states patient can be discharged home per EDP.  History of seizures IV 1 g of Keppra  was given in the ED Keppra  level will be checked EEG in the morning Consider neurology consult  Essential hypertension -controlled Patient's meds yet to be reconciliated.  Mixed hyperlipidemia Patient's meds yet to be reconciliated.  DVT prophylaxis:  SCDs  Code Status: Full code  Family Communication: None at bedside  Consults: None  Severity of Illness: The appropriate patient status for this patient is OBSERVATION. Observation status is judged to be reasonable and necessary in order to provide the required intensity of service to ensure the patient's safety. The patient's presenting symptoms, physical exam findings, and initial radiographic and laboratory data in the context of their medical condition is felt to place them at decreased risk for further clinical deterioration. Furthermore, it is anticipated that the patient will be medically stable for discharge from the hospital within 2 midnights of admission.   Author: Zadaya Cuadra, DO 02/26/2024 3:19 AM  For on call review [email or web address].

## 2024-02-26 NOTE — Progress Notes (Signed)
 [facility] called for update on patient. Update given and staff member confirmed the patient has had no medications since moving into their facility. Staff member stated that she was in communication with the patient's daughter and would notify her of status and room number.

## 2024-02-26 NOTE — Progress Notes (Signed)
   02/26/24 1817  TOC Brief Assessment  Insurance and Status Reviewed  Patient has primary care physician Yes  Home environment has been reviewed From [facility]  Prior level of function: Assisted  Prior/Current Home Services No current home services  Social Drivers of Health Review SDOH reviewed no interventions necessary  Readmission risk has been reviewed Yes  Transition of care needs transition of care needs identified, TOC will continue to follow   Transition of Care Department [facility]) has reviewed patient and will continue to monitor patient advancement through interdisciplinary progression rounds.

## 2024-02-26 NOTE — Progress Notes (Addendum)
 PROGRESS NOTE  Anna Jacobs FMW:[id number] DOB: 03-Jul-1940 DOA: 02/25/2024 PCP: Trudy Vaughn FALCON, MD  Brief History:  83 year old female with a history of hypertension, hyperlipidemia, seizure disorder, dementia, overactive bladder presenting from her skilled nursing facility after a fall.  Apparently, she has been at the nursing facility for about 2 weeks.  The patient apparently had a fall of a chair and went face first onto the floor. Due to the patient's dementia, she is unable to provide any significant history.  History is obtained from review of the medical record. Apparently, the patient was admitted to [facility] from 10/11/2023 to 5-25 after family noted seizure-like activity at home.  At that time, she was noted to have bitten her tongue.  She was initially treated with Keppra  500 mg twice daily, but the patient apparently was somnolent and her Keppra  was felt to be contributing.  Notably her Seroquel was discontinued at the same time.  The patient was discharged home with Keppra  250 mg daily.  UTI at that time was felt to be contributing.  Ultimately her mental status improved and the patient interacted with the staff.  She was discharged home at that time because she was denied for SNF.  According to the discharge summary, her only discharge medication at that time was aspirin  81 mg, Keppra  250 mg daily, and Seroquel 25 mg at night.  In seeing the patient on the morning of 02/26/2024 the patient is resting, but arouses easily to voice.  Upon asking the patient questions, the patient states  bothering me.  Upon examining the patient, the patient states  get your goddamn hands off me.  She continued to use multiple expletives expressing her displeasure with a variety of complaints.  In the ED, the patient was afebrile hemodynamically stable with oxygen saturation 90% room air.  WBC 6.5, hemoglobin 14.0, platelet 249.  Sodium 139, potassium 4.0, bicarbonate 24, serum creatinine 1.20.   LFTs are unremarkable.  CT of the brain showed a trace amount of subarachnoid hemorrhage in the right parietal sulcus.  Neurosurgery was consulted.  Dr. Colon felt the patient was stable for discharge.  However because of increased serum creatinine and pyuria, the patient was admitted for further evaluation and treatment. CT of the cervical spine was negative for fracture or listhesis.    Assessment/Plan: Subarachnoid hemorrhage - EDP spoke with neurosurgery, Dr. 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  Greater than 50% spent face to face counseling and coordinating care.    Subjective: Unobtainable due to dementia.  Pt speaks explitives.  Objective: Vitals:   02/26/24 0100 02/26/24 0200 02/26/24 0240 02/26/24 0400  BP: 130/67 123/70 134/71 (!) 142/82  Pulse:    72  Resp: 10   14  Temp:      TempSrc:      SpO2:      Weight:      Height:        Intake/Output Summary (Last 24 hours) at 02/26/2024 9094 Last data filed at 02/25/2024 2330 Gross per 24 hour  Intake --  Output 400 ml  Net -400 ml   Weight change:  Exam:  General:  Pt is  alert, does not follow commands appropriately, not in acute distress HEENT: No icterus, No thrush, No neck mass, [place]/AT Cardiovascular: RRR, S1/S2, no rubs, no gallops Respiratory: bibasilar crackles. No wheeze Abdomen: Soft/+BS, non tender, non distended, no guarding Extremities: No edema, No lymphangitis, No petechiae, No rashes, no synovitis   Data Reviewed: I have personally reviewed following labs and imaging studies Basic Metabolic Panel: Recent Labs  Lab 02/25/24 2325 02/26/24 0343  NA 139 137  K 4.0 3.5  CL 103 106  CO2 24 22  GLUCOSE 105* 87  BUN 53* 47*  CREATININE 1.20* 0.96  CALCIUM  9.5 9.0  MG  --  2.3  PHOS  --  3.0   Liver Function Tests: Recent Labs  Lab 02/25/24 2325 02/26/24 0343  AST 19 17  ALT 14 14  ALKPHOS 55 53  BILITOT 1.5* 1.2  PROT 6.8 6.6  ALBUMIN 4.3 3.9   No results for input(s): LIPASE, AMYLASE in the last 168 hours. No results for input(s): AMMONIA in the last 168 hours. Coagulation Profile: No results for input(s): INR, PROTIME in the last 168 hours. CBC: Recent Labs  Lab 02/25/24 2325 02/26/24 0343  WBC 6.5 7.0  NEUTROABS 5.5  --   HGB 14.0 13.9  HCT 43.0 43.0  MCV 92.3 92.3  PLT 249 215   Cardiac Enzymes: No results for input(s): CKTOTAL, CKMB, CKMBINDEX, TROPONINI in the last 168 hours. BNP: Invalid input(s): POCBNP CBG: No results for input(s): GLUCAP in the last 168 hours. HbA1C: No results for input(s): HGBA1C in the last 72 hours. Urine analysis:    Component Value Date/Time   COLORURINE YELLOW 02/25/2024 2345   APPEARANCEUR HAZY (A) 02/25/2024 2345   APPEARANCEUR Clear 11/28/2023 1435   LABSPEC 1.024 02/25/2024 2345   PHURINE 5.0 02/25/2024 2345   GLUCOSEU NEGATIVE 02/25/2024 2345   HGBUR SMALL (A) 02/25/2024 2345   BILIRUBINUR NEGATIVE 02/25/2024 2345   BILIRUBINUR Negative 11/28/2023 1435   KETONESUR 5 (A) 02/25/2024 2345   PROTEINUR 100 (A) 02/25/2024 2345   NITRITE POSITIVE  (A) 02/25/2024 2345   LEUKOCYTESUR TRACE (A) 02/25/2024 2345   Sepsis Labs: @LABRCNTIP (procalcitonin:4,lacticidven:4) )No results found for this or any previous visit (from the past 240 hours).   Scheduled Meds:  Chlorhexidine  Gluconate Cloth  6 each Topical Q0600   mupirocin  ointment   Nasal BID   Continuous Infusions:  cefTRIAXone  (ROCEPHIN )  IV     lactated ringers  70 mL/hr at 02/26/24 9587    Procedures/Studies: CT Head Wo Contrast Result Date: 02/25/2024 CLINICAL DATA:  Facial trauma, blunt EXAM: CT HEAD WITHOUT CONTRAST CT CERVICAL SPINE WITHOUT CONTRAST TECHNIQUE: Multidetector CT imaging of the head and cervical spine was performed following the standard protocol without intravenous contrast. Multiplanar CT image reconstructions of the cervical spine were also generated. RADIATION DOSE REDUCTION: This exam was performed according to the departmental dose-optimization program which includes automated  exposure control, adjustment of the mA and/or kV according to patient size and/or use of iterative reconstruction technique. COMPARISON:  August 07, 2023 CT head and CT cervical spine. FINDINGS: CT HEAD FINDINGS Brain: Trace subarachnoid hemorrhage in a high right parietal sulcus (see series 3, image 14 and series 6, image 53). No evidence of acute large vascular territory infarct, hydrocephalus, extra-axial collection or mass lesion/mass effect. Patchy white matterHypodensities are nonspecific but compatible with at least moderate chronic microvascular ischemic change. Cerebral atrophy. Vascular: Calcific atherosclerosis.  No hyperdense vessel. Skull: No acute fracture.  Left forehead contusion Sinuses/Orbits: Clear sinuses.  No acute orbital findings. CT CERVICAL SPINE FINDINGS Alignment: Mild grade 1 anterolisthesis of C4 on C5 and C7 on T1 is most likely degenerative in etiology given facet arthropathy at these levels. Otherwise, no substantial sagittal subluxation. Skull base and  vertebrae: No evidence of acute fracture. Vertebral body heights are maintained. Soft tissues and spinal canal: No prevertebral fluid or swelling. No visible canal hematoma. Disc levels: Multilevel degenerative disc disease including disc height loss and endplate spurring. Facet and uncovertebral hypertrophy contributes to varying degrees of neural foraminal stenosis, likely severe on the right at C4-C5. Upper chest: Visualized lung apices are clear. IMPRESSION: 1. Trace acute subarachnoid hemorrhage in a high right parietal sulcus. No mass effect. 2. No evidence of acute fracture or traumatic malalignment in the cervical spine. 3. Multilevel degenerative change including likely severe foraminal stenosis on the right at C4-C5. MRI could further characterize if clinically warranted. Findings discussed with Dr. Theadore via telephone at 11:21 PM. Electronically Signed   By: Gilmore GORMAN Molt M.D.   On: 02/25/2024 23:23   CT Cervical Spine Wo Contrast Result Date: 02/25/2024 CLINICAL DATA:  Facial trauma, blunt EXAM: CT HEAD WITHOUT CONTRAST CT CERVICAL SPINE WITHOUT CONTRAST TECHNIQUE: Multidetector CT imaging of the head and cervical spine was performed following the standard protocol without intravenous contrast. Multiplanar CT image reconstructions of the cervical spine were also generated. RADIATION DOSE REDUCTION: This exam was performed according to the departmental dose-optimization program which includes automated exposure control, adjustment of the mA and/or kV according to patient size and/or use of iterative reconstruction technique. COMPARISON:  August 07, 2023 CT head and CT cervical spine. FINDINGS: CT HEAD FINDINGS Brain: Trace subarachnoid hemorrhage in a high right parietal sulcus (see series 3, image 14 and series 6, image 53). No evidence of acute large vascular territory infarct, hydrocephalus, extra-axial collection or mass lesion/mass effect. Patchy white matterHypodensities are nonspecific but  compatible with at least moderate chronic microvascular ischemic change. Cerebral atrophy. Vascular: Calcific atherosclerosis.  No hyperdense vessel. Skull: No acute fracture.  Left forehead contusion Sinuses/Orbits: Clear sinuses.  No acute orbital findings. CT CERVICAL SPINE FINDINGS Alignment: Mild grade 1 anterolisthesis of C4 on C5 and C7 on T1 is most likely degenerative in etiology given facet arthropathy at these levels. Otherwise, no substantial sagittal subluxation. Skull base and vertebrae: No evidence of acute fracture. Vertebral body heights are maintained. Soft tissues and spinal canal: No prevertebral fluid or swelling. No visible canal hematoma. Disc levels: Multilevel degenerative disc disease including disc height loss and endplate spurring. Facet and uncovertebral hypertrophy contributes to varying degrees of neural foraminal stenosis, likely severe on the right at C4-C5. Upper chest: Visualized lung apices are clear. IMPRESSION: 1. Trace acute subarachnoid hemorrhage in a high right parietal sulcus. No mass effect. 2. No evidence of acute fracture or traumatic malalignment in the cervical spine. 3. Multilevel degenerative change including likely severe  foraminal stenosis on the right at C4-C5. MRI could further characterize if clinically warranted. Findings discussed with Dr. Theadore via telephone at 11:21 PM. Electronically Signed   By: Gilmore GORMAN Molt M.D.   On: 02/25/2024 23:23    Alm Schneider, DO  [facility]  If 7PM-7AM, please contact night-coverage [email or web address] Password [email or web address] 02/26/2024, 9:05 AM   LOS: 0 days

## 2024-02-26 NOTE — Plan of Care (Signed)

## 2024-02-26 NOTE — Hospital Course (Signed)
 83 year old female with a history of hypertension, hyperlipidemia, seizure disorder, dementia, overactive bladder presenting from her skilled nursing facility after a fall.  Apparently, she has been at the nursing facility for about 2 weeks.  The patient apparently had a fall of a chair and went face first onto the floor. Due to the patient's dementia, she is unable to provide any significant history.  History is obtained from review of the medical record. Apparently, the patient was admitted to [facility] from 10/11/2023 to 5-25 after family noted seizure-like activity at home.  At that time, she was noted to have bitten her tongue.  She was initially treated with Keppra  500 mg twice daily, but the patient apparently was somnolent and her Keppra  was felt to be contributing.  Notably her Seroquel was discontinued at the same time.  The patient was discharged home with Keppra  250 mg daily.  UTI at that time was felt to be contributing.  Ultimately her mental status improved and the patient interacted with the staff.  She was discharged home at that time because she was denied for SNF.  According to the discharge summary, her only discharge medication at that time was aspirin  81 mg, Keppra  250 mg daily, and Seroquel 25 mg at night.  In seeing the patient on the morning of 02/26/2024 the patient is resting, but arouses easily to voice.  Upon asking the patient questions, the patient states  bothering me.  Upon examining the patient, the patient states  get your goddamn hands off me.  She continued to use multiple expletives expressing her displeasure with a variety of complaints.  In the ED, the patient was afebrile hemodynamically stable with oxygen saturation 90% room air.  WBC 6.5, hemoglobin 14.0, platelet 249.  Sodium 139, potassium 4.0, bicarbonate 24, serum creatinine 1.20.  LFTs are unremarkable.  CT of the brain showed a trace amount of subarachnoid hemorrhage in the right parietal sulcus.  Neurosurgery was  consulted.  Dr. Colon felt the patient was stable for discharge.  However because of increased serum creatinine and pyuria, the patient was admitted for further evaluation and treatment. CT of the cervical spine was negative for fracture or listhesis.  In the morning 02/27/24, daughter ultimately returned my call.  She states there is concern that pt had not been receiving her keppra  at [facility] for several days.  She states pt was witness having tonic activity before she fell out of her chair.  Daughter states pt is taking keppra  250 mg bid at baseline.  She states that pt is confused at baseline but to interact.  On the morning of 02/28/2024, the patient was awake and alert.  She was interactive although she remained pleasantly confused.  She was less agitated.  The patient was medically cleared for discharge to skilled nursing facility.

## 2024-02-27 ENCOUNTER — Observation Stay (HOSPITAL_COMMUNITY)
Admit: 2024-02-27 | Discharge: 2024-02-27 | Disposition: A | Discharge status: Home / Self Care | Attending: Internal Medicine

## 2024-02-27 DIAGNOSIS — N179 Acute kidney failure, unspecified: Secondary | ICD-10-CM | POA: Diagnosis not present

## 2024-02-27 DIAGNOSIS — I609 Nontraumatic subarachnoid hemorrhage, unspecified: Secondary | ICD-10-CM | POA: Diagnosis not present

## 2024-02-27 DIAGNOSIS — R569 Unspecified convulsions: Secondary | ICD-10-CM

## 2024-02-27 DIAGNOSIS — S066X0A Traumatic subarachnoid hemorrhage without loss of consciousness, initial encounter: Secondary | ICD-10-CM | POA: Diagnosis not present

## 2024-02-27 DIAGNOSIS — N3000 Acute cystitis without hematuria: Secondary | ICD-10-CM | POA: Diagnosis not present

## 2024-02-27 LAB — BASIC METABOLIC PANEL WITH GFR
Anion gap: 7 (ref 5–15)
BUN: 29 mg/dL — ABNORMAL HIGH (ref 8–23)
CO2: 23 mmol/L (ref 22–32)
Calcium: 8.8 mg/dL — ABNORMAL LOW (ref 8.9–10.3)
Chloride: 108 mmol/L (ref 98–111)
Creatinine, Ser: 0.72 mg/dL (ref 0.44–1.00)
GFR, Estimated: >60 mL/min (ref >60)
Glucose, Bld: 85 mg/dL (ref 70–99)
Potassium: 4 mmol/L (ref 3.5–5.1)
Sodium: 138 mmol/L (ref 135–145)

## 2024-02-27 LAB — MAGNESIUM: Magnesium: 2 mg/dL (ref 1.7–2.4)

## 2024-02-27 MED ORDER — LEVETIRACETAM (KEPPRA) 500 MG/5 ML ADULT IV PUSH
250.0000 mg | Freq: Two times a day (BID) | INTRAVENOUS | Status: DC
  Administered 2024-02-27 – 2024-02-28 (×3): 250 mg via INTRAVENOUS
  Filled 2024-02-27 (×4): qty 5

## 2024-02-27 NOTE — TOC Initial Note (Signed)
 Transition of Care [facility]) - Initial/Assessment Note    Patient Details  Name: Anna Jacobs MRN: [id number] Date of Birth: [birth year]  [facility]) CM/SW Contact:    Anna [name], LCSW Phone Number: 02/27/2024, 11:18 AM  Clinical Narrative:                 Pt recently moved to Memory Care at [facility] on 02/16/24. Pt admitted to hospital due to fall and possible seizure activity. Pt recommended for STR placement and pt's daughter is agreeable to this plan. Per daughter pt has not been to SNF in the past and she does not currently have a facility preference.  Referrals have been faxed out and currently awaiting bed offers.   Expected Discharge Plan: Skilled Nursing Facility Barriers to Discharge: Continued Medical Work up, SNF Pending bed offer   Patient Goals and CMS Choice Patient states their goals for this hospitalization and ongoing recovery are:: For pt to return to [facility] following rehab CMS Medicare.gov Compare Post Acute Care list provided to:: Patient Represenative (must comment) Choice offered to / list presented to : Adult Children [place] ownership interest in [facility].provided to:: Adult Children    Expected Discharge Plan and Services In-house Referral: Clinical Social Work Discharge Planning Services: NA Post Acute Care Choice: Skilled Nursing Facility Living arrangements for the past 2 months: Assisted Living Facility                 DME Arranged: N/A DME Agency: NA                  Prior Living Arrangements/Services Living arrangements for the past 2 months: Assisted Living Facility Lives with:: Facility Resident Patient language and need for interpreter reviewed:: Yes Do you feel safe going back to the place where you live?: Yes      Need for Family Participation in Patient Care: Yes (Comment) Care giver support system in place?: Yes (comment) Current home services: DME Criminal Activity/Legal Involvement  Pertinent to Current Situation/Hospitalization: No - Comment as needed  Activities of Daily Living      Permission Sought/Granted Permission sought to share information with : Family Supports, Oceanographer granted to share information with : Yes, Verbal Permission Granted  Share Information with Anna Jacobs (Daughter)  [phone]  Permission granted to share info w AGENCY: [facility] and SNF's        Emotional Assessment Appearance:: Appears stated age Attitude/Demeanor/Rapport: Unable to Assess Affect (typically observed): Unable to Assess Orientation: : Oriented to Self Alcohol / Substance Use: Not Applicable Psych Involvement: No (comment)  Admission diagnosis:  Acute cystitis [N30.00] SAH (subarachnoid hemorrhage) (HCC) [I60.9] Acute cystitis without hematuria [N30.00] AKI (acute kidney injury) (HCC) [N17.9] Facial laceration, initial encounter [S01.81XA] Patient Active Problem List   Diagnosis Date Noted   Acute cystitis 02/26/2024   Fall at home, initial encounter 02/26/2024   AKI (acute kidney injury) (HCC) 02/26/2024   Subarachnoid hemorrhage (HCC) 02/26/2024   History of seizures 02/26/2024   Facial laceration 02/26/2024   Essential hypertension 05/10/2022   Dyspnea on exertion 05/10/2022   S/P total knee arthroplasty, 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 - [place], [place] - [address] [place] [address] Phone: [phone] Fax: [phone]     Social  Drivers of Health (SDOH) Social History: SDOH Screenings   Food Insecurity: No Food Insecurity (10/12/2023)   Received from [facility]  Transportation Needs: No Transportation Needs (10/12/2023)   Received from [facility]  Utilities: Low Risk  (10/12/2023)   Received from [facility]  Financial Resource Strain: Low Risk  (10/12/2023)   Received from [facility]  Tobacco Use: Medium Risk (02/25/2024)   SDOH Interventions:     Readmission Risk Interventions     No data to display

## 2024-02-27 NOTE — Progress Notes (Signed)
 EEG complete - results pending

## 2024-02-27 NOTE — Progress Notes (Signed)
 PROGRESS NOTE  Anna Jacobs FMW:[id number] DOB: 28-Nov-1940 DOA: 02/25/2024 PCP: Trudy Vaughn FALCON, MD  Brief History:  83 year old female with a history of hypertension, hyperlipidemia, seizure disorder, dementia, overactive bladder presenting from her skilled nursing facility after a fall.  Apparently, she has been at the nursing facility for about 2 weeks.  The patient apparently had a fall of a chair and went face first onto the floor. Due to the patient's dementia, she is unable to provide any significant history.  History is obtained from review of the medical record. Apparently, the patient was admitted to [facility] from 10/11/2023 to 5-25 after family noted seizure-like activity at home.  At that time, she was noted to have bitten her tongue.  She was initially treated with Keppra  500 mg twice daily, but the patient apparently was somnolent and her Keppra  was felt to be contributing.  Notably her Seroquel was discontinued at the same time.  The patient was discharged home with Keppra  250 mg daily.  UTI at that time was felt to be contributing.  Ultimately her mental status improved and the patient interacted with the staff.  She was discharged home at that time because she was denied for SNF.  According to the discharge summary, her only discharge medication at that time was aspirin  81 mg, Keppra  250 mg daily, and Seroquel 25 mg at night.  In seeing the patient on the morning of 02/26/2024 the patient is resting, but arouses easily to voice.  Upon asking the patient questions, the patient states  bothering me.  Upon examining the patient, the patient states  get your goddamn hands off me.  She continued to use multiple expletives expressing her displeasure with a variety of complaints.  In the ED, the patient was afebrile hemodynamically stable with oxygen saturation 90% room air.  WBC 6.5, hemoglobin 14.0, platelet 249.  Sodium 139, potassium 4.0, bicarbonate 24, serum creatinine 1.20.   LFTs are unremarkable.  CT of the brain showed a trace amount of subarachnoid hemorrhage in the right parietal sulcus.  Neurosurgery was consulted.  Dr. Colon felt the patient was stable for discharge.  However because of increased serum creatinine and pyuria, the patient was admitted for further evaluation and treatment. CT of the cervical spine was negative for fracture or listhesis.  In the morning 02/27/24, daughter ultimately returned my call.  She states there is concern that pt had not been receiving her keppra  at [facility] for several days.  She states pt was witness having tonic activity before she fell out of her chair.  Daughter states pt is taking keppra  250 mg bid at baseline.  She states that pt is confused at baseline but to interact.    Assessment/Plan: Subarachnoid hemorrhage - EDP spoke with neurosurgery, Dr. Elsner>> no further imaging needed   Pyuria - Concern for cystitis - Urine culture was not sent - UA 11-20 WBC +nitrite - Continue empiric ceftriaxone  for now based on prior urine culture data   AKI - Baseline creatinine 0.6-0.8 - Presented with serum creatinine 1.20 - Continue IV fluids>> improved   Fall with scalp laceration - PT evaluation>>SNF - Obtain EEG--no seizure - Check CK--162   Seizure disorder - Unclear circumstances around patient's fall - Obtain EEG--no seizure - Continue Keppra  - 9/8 based upon hx provided by daughter>>restart Keppra  250 mg bid   Major neurocognitive disorder -The patient is not on any medications -She is at risk for  delirium -B12--1890 -Folic acid --11.8 -TSH--0.411   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  Denies pain.   Remainder ROS unobtainable due to dementia  Objective: Vitals:   02/26/24 0240 02/26/24 0400 02/26/24 1412 02/27/24 1414  BP: 134/71 (!) 142/82 122/75 (!) 148/87  Pulse:  72 79 64  Resp:  14 16 18   Temp:   97.7 F (36.5 C)   TempSrc:   Oral   SpO2:   100%   Weight:      Height:        Intake/Output Summary (Last 24 hours) at 02/27/2024 1536 Last data filed at 02/27/2024 1300 Gross per 24 hour  Intake 0 ml  Output --  Net 0 ml   Weight change:  Exam:  General:  Pt is alert, follows commands appropriately, not in acute distress HEENT: No icterus, No thrush, No neck mass, [place]/AT Cardiovascular: RRR, S1/S2, no rubs, no gallops Respiratory: scattered rales Abdomen: Soft/+BS, non tender, non distended, no guarding Extremities: No edema, No lymphangitis, No petechiae, No rashes, no synovitis   Data Reviewed: I have personally reviewed following labs and imaging studies Basic Metabolic Panel: Recent Labs  Lab 02/25/24 2325 02/26/24 0343 02/27/24 0404  NA 139 137 138  K 4.0 3.5 4.0  CL 103 106 108  CO2 24 22 23   GLUCOSE 105* 87 85  BUN 53* 47* 29*  CREATININE 1.20* 0.96 0.72  CALCIUM  9.5 9.0 8.8*  MG  --  2.3 2.0  PHOS  --  3.0  --    Liver Function Tests: Recent Labs  Lab 02/25/24 2325 02/26/24 0343  AST 19 17  ALT 14 14  ALKPHOS 55 53  BILITOT 1.5* 1.2  PROT 6.8 6.6  ALBUMIN 4.3 3.9   No results for input(s): LIPASE, AMYLASE in the last 168 hours. No results for input(s): AMMONIA in the last 168 hours. Coagulation Profile: No results for input(s): INR, PROTIME in the last 168 hours. CBC: Recent Labs  Lab 02/25/24 2325 02/26/24 0343  WBC 6.5 7.0  NEUTROABS 5.5  --   HGB 14.0 13.9  HCT 43.0 43.0  MCV 92.3 92.3  PLT 249 215   Cardiac Enzymes: Recent Labs  Lab 02/26/24 1038  CKTOTAL 162   BNP: Invalid input(s): POCBNP CBG: No results for input(s): GLUCAP in the last 168 hours. HbA1C: No results for input(s): HGBA1C in the  last 72 hours. Urine analysis:    Component Value Date/Time   COLORURINE YELLOW 02/25/2024 2345   APPEARANCEUR HAZY (A) 02/25/2024 2345   APPEARANCEUR Clear 11/28/2023 1435   LABSPEC 1.024 02/25/2024 2345   PHURINE 5.0 02/25/2024 2345   GLUCOSEU NEGATIVE 02/25/2024 2345   HGBUR SMALL (A) 02/25/2024 2345   BILIRUBINUR NEGATIVE 02/25/2024 2345   BILIRUBINUR Negative 11/28/2023 1435   KETONESUR 5 (A) 02/25/2024 2345   PROTEINUR 100 (A) 02/25/2024 2345   NITRITE POSITIVE (A) 02/25/2024 2345   LEUKOCYTESUR TRACE (A) 02/25/2024 2345   Sepsis Labs: @LABRCNTIP (procalcitonin:4,lacticidven:4) )No results found for this or any previous visit (from the past 240 hours).   Scheduled Meds:  Chlorhexidine  Gluconate  Cloth  6 each Topical Q0600   levETIRAcetam   250 mg Intravenous Q12H   mupirocin  ointment   Nasal BID   Continuous Infusions:  cefTRIAXone  (ROCEPHIN )  IV 1 g (02/27/24 1011)    Procedures/Studies: EEG adult Result Date: 02/27/2024 Shelton Arlin KIDD, MD     02/27/2024  2:20 PM Patient Name: Anna Jacobs MRN: [id number] Epilepsy Attending: Arlin KIDD Shelton Referring Physician/Provider: Adefeso, Oladapo, DO Date: 02/27/2024 Duration: 23.10 mins Patient history: 83yo F with seizure like activity. EEG to evaluate for seizure Level of alertness: Awake AEDs during EEG study: LEV Technical aspects: This EEG study was done with scalp electrodes positioned according to the 10-20 International system of electrode placement. Electrical activity was reviewed with band pass filter of 1-70Hz , sensitivity of 7 uV/mm, display speed of 9mm/sec with a 60Hz  notched filter applied as appropriate. EEG data were recorded continuously and digitally stored.  Video monitoring was available and reviewed as appropriate. Description: The posterior dominant rhythm consists of 8 Hz activity of moderate voltage (25-35 uV) seen predominantly in posterior head regions, symmetric and reactive to eye opening and eye closing.  EEG showed intermittent generalized 3 to 6 Hz theta-delta slowing admixed with 15 to 18 Hz beta activity distributed symmetrically and diffusely. Hyperventilation and photic stimulation were not performed.   ABNORMALITY - Intermittent slow, generalized IMPRESSION: This study is suggestive of mild diffuse encephalopathy. No seizures or epileptiform discharges were seen throughout the recording. Priyanka O Yadav   CT Head Wo Contrast Result Date: 02/25/2024 CLINICAL DATA:  Facial trauma, blunt EXAM: CT HEAD WITHOUT CONTRAST CT CERVICAL SPINE WITHOUT CONTRAST TECHNIQUE: Multidetector CT imaging of the head and cervical spine was performed following the standard protocol without intravenous contrast. Multiplanar CT image reconstructions of the cervical spine were also generated. RADIATION DOSE REDUCTION: This exam was performed according to the departmental dose-optimization program which includes automated exposure control, adjustment of the mA and/or kV according to patient size and/or use of iterative reconstruction technique. COMPARISON:  August 07, 2023 CT head and CT cervical spine. FINDINGS: CT HEAD FINDINGS Brain: Trace subarachnoid hemorrhage in a high right parietal sulcus (see series 3, image 14 and series 6, image 53). No evidence of acute large vascular territory infarct, hydrocephalus, extra-axial collection or mass lesion/mass effect. Patchy white matterHypodensities are nonspecific but compatible with at least moderate chronic microvascular ischemic change. Cerebral atrophy. Vascular: Calcific atherosclerosis.  No hyperdense vessel. Skull: No acute fracture.  Left forehead contusion Sinuses/Orbits: Clear sinuses.  No acute orbital findings. CT CERVICAL SPINE FINDINGS Alignment: Mild grade 1 anterolisthesis of C4 on C5 and C7 on T1 is most likely degenerative in etiology given facet arthropathy at these levels. Otherwise, no substantial sagittal subluxation. Skull base and vertebrae: No evidence of  acute fracture. Vertebral body heights are maintained. Soft tissues and spinal canal: No prevertebral fluid or swelling. No visible canal hematoma. Disc levels: Multilevel degenerative disc disease including disc height loss and endplate spurring. Facet and uncovertebral hypertrophy contributes to varying degrees of neural foraminal stenosis, likely severe on the right at C4-C5. Upper chest: Visualized lung apices are clear. IMPRESSION: 1. Trace acute subarachnoid hemorrhage in a high right parietal sulcus. No mass effect. 2. No evidence of acute fracture or traumatic malalignment in the cervical spine. 3. Multilevel degenerative change including likely severe foraminal stenosis on the right at C4-C5. MRI could further characterize if clinically warranted. Findings discussed with Dr. Theadore via telephone at 11:21 PM. Electronically Signed   By: Gilmore GORMAN Molt  M.D.   On: 02/25/2024 23:23   CT Cervical Spine Wo Contrast Result Date: 02/25/2024 CLINICAL DATA:  Facial trauma, blunt EXAM: CT HEAD WITHOUT CONTRAST CT CERVICAL SPINE WITHOUT CONTRAST TECHNIQUE: Multidetector CT imaging of the head and cervical spine was performed following the standard protocol without intravenous contrast. Multiplanar CT image reconstructions of the cervical spine were also generated. RADIATION DOSE REDUCTION: This exam was performed according to the departmental dose-optimization program which includes automated exposure control, adjustment of the mA and/or kV according to patient size and/or use of iterative reconstruction technique. COMPARISON:  August 07, 2023 CT head and CT cervical spine. FINDINGS: CT HEAD FINDINGS Brain: Trace subarachnoid hemorrhage in a high right parietal sulcus (see series 3, image 14 and series 6, image 53). No evidence of acute large vascular territory infarct, hydrocephalus, extra-axial collection or mass lesion/mass effect. Patchy white matterHypodensities are nonspecific but compatible with at least  moderate chronic microvascular ischemic change. Cerebral atrophy. Vascular: Calcific atherosclerosis.  No hyperdense vessel. Skull: No acute fracture.  Left forehead contusion Sinuses/Orbits: Clear sinuses.  No acute orbital findings. CT CERVICAL SPINE FINDINGS Alignment: Mild grade 1 anterolisthesis of C4 on C5 and C7 on T1 is most likely degenerative in etiology given facet arthropathy at these levels. Otherwise, no substantial sagittal subluxation. Skull base and vertebrae: No evidence of acute fracture. Vertebral body heights are maintained. Soft tissues and spinal canal: No prevertebral fluid or swelling. No visible canal hematoma. Disc levels: Multilevel degenerative disc disease including disc height loss and endplate spurring. Facet and uncovertebral hypertrophy contributes to varying degrees of neural foraminal stenosis, likely severe on the right at C4-C5. Upper chest: Visualized lung apices are clear. IMPRESSION: 1. Trace acute subarachnoid hemorrhage in a high right parietal sulcus. No mass effect. 2. No evidence of acute fracture or traumatic malalignment in the cervical spine. 3. Multilevel degenerative change including likely severe foraminal stenosis on the right at C4-C5. MRI could further characterize if clinically warranted. Findings discussed with Dr. Theadore via telephone at 11:21 PM. Electronically Signed   By: Gilmore GORMAN Molt M.D.   On: 02/25/2024 23:23    Alm Schneider, DO  [facility]  If 7PM-7AM, please contact night-coverage [email or web address] Password [email or web address] 02/27/2024, 3:36 PM   LOS: 0 days

## 2024-02-27 NOTE — Evaluation (Signed)
 Physical Therapy Evaluation Patient Details Name: Anna Jacobs MRN: [id number] DOB: 02-05-1941 Today's Date: 02/27/2024  History of Present Illness  Anna Jacobs is an 83 y.o. female with medical history significant of hypertension, hyperlipidemia, dementia, seizure, recurrent UTI who presents to the emergency department from SNF via EMS due to a fall.  At bedside, patient was unable to provide history possibly due to being  somnolent, though arousable, but quickly goes back to sleep, she also has a history of dementia.  History was obtained from EDP and ED medical record.  Per report, patient fell out of chair and landed with face first onto the floor and she sustained laceration of right sided forehead.  Apparently, patient was recently admitted to the skilled nursing facility about 2 weeks ago and it was unknown to the nursing staff that patient has home medications, so she has not received any medication since she arrived to this facility.   Clinical Impression  Patient requires repeated verbal/tactile cuing for participating with therapy due to confusion.  Patient demonstrates slow labored movement for sitting up at bedside requiring frequent rest breaks, verbal/tactile cueing, poor carryover for following instructions, but able to take a few steps at bedside with bilateral hand held assist before having to sitit in chair due to fatigue and poor standing balance. Patient put back to bed after therapy for safety. Patient will benefit from continued skilled physical therapy in hospital and recommended venue below to increase strength, balance, endurance for safe ADLs and gait.          If plan is discharge home, recommend the following: A lot of help with bathing/dressing/bathroom;A lot of help with walking and/or transfers;Help with stairs or ramp for entrance;Assist for transportation   Can travel by private vehicle   No    Equipment Recommendations None recommended by PT   Recommendations for Other Services       Functional Status Assessment Patient has had a recent decline in their functional status and demonstrates the ability to make significant improvements in function in a reasonable and predictable amount of time.     Precautions / Restrictions Precautions Precautions: Fall Recall of Precautions/Restrictions: Impaired Restrictions Weight Bearing Restrictions Per Provider Order: No      Mobility  Bed Mobility Overal bed mobility: Needs Assistance Bed Mobility: Supine to Sit     Supine to sit: Mod assist     General bed mobility comments: increased time requiring frequent redirection for attempting functional tasks    Transfers Overall transfer level: Needs assistance Equipment used: 1 person hand held assist Transfers: Sit to/from Stand, Bed to chair/wheelchair/BSC Sit to Stand: Mod assist, Min assist   Step pivot transfers: Mod assist       General transfer comment: increased time, labored movement, required hand held assist for safety, poor standing balance    Ambulation/Gait Ambulation/Gait assistance: Mod assist Gait Distance (Feet): 4 Feet Assistive device: 1 person hand held assist Gait Pattern/deviations: Decreased step length - right, Decreased step length - left, Decreased stride length Gait velocity: slow     General Gait Details: limited to a few slow labored side steps requring hand held assist and repeated verbal/tactile cueing for safety  Stairs            Wheelchair Mobility     Tilt Bed    Modified Rankin (Stroke Patients Only)       Balance Overall balance assessment: Needs assistance Sitting-balance support: Feet supported, No upper extremity supported Sitting balance-Leahy Scale:  Fair Sitting balance - Comments: seated at EOB   Standing balance support: During functional activity, Bilateral upper extremity supported Standing balance-Leahy Scale: Poor Standing balance comment: poor to  fair with hand held assist/support under shoulders                             Pertinent Vitals/Pain Pain Assessment Pain Assessment: Faces Faces Pain Scale: No hurt    Home Living Family/patient expects to be discharged to:: Assisted living                   Additional Comments: Pt is a poor historian.    Prior Function Prior Level of Function : Patient poor historian/Family not available             Mobility Comments: Unknown. Pt from ALF. ADLs Comments: Unknown. Pt from ALF.     Extremity/Trunk Assessment   Upper Extremity Assessment Upper Extremity Assessment: Defer to OT evaluation    Lower Extremity Assessment Lower Extremity Assessment: Generalized weakness    Cervical / Trunk Assessment Cervical / Trunk Assessment: Kyphotic  Communication   Communication Communication: No apparent difficulties Factors Affecting Communication: Other (comment) (Patient confused with poor carryover for following directions)    Cognition Arousal: Alert Behavior During Therapy: Agitated, Impulsive   PT - Cognitive impairments: History of cognitive impairments                         Following commands: Impaired       Cueing Cueing Techniques: Verbal cues, Tactile cues, Visual cues, Gestural cues     General Comments      Exercises     Assessment/Plan    PT Assessment Patient needs continued PT services  PT Problem List Decreased strength;Decreased activity tolerance;Decreased balance;Decreased mobility       PT Treatment Interventions DME instruction;Gait training;Stair training;Functional mobility training;Therapeutic activities;Therapeutic exercise;Balance training;Patient/family education    PT Goals (Current goals can be found in the Care Plan section)  Acute Rehab PT Goals Patient Stated Goal: not stated PT Goal Formulation: With patient Time For Goal Achievement: 03/12/24 Potential to Achieve Goals: Good    Frequency  Min 3X/week     Co-evaluation PT/OT/SLP Co-Evaluation/Treatment: Yes Reason for Co-Treatment: To address functional/ADL transfers PT goals addressed during session: Mobility/safety with mobility;Balance OT goals addressed during session: ADL's and self-care       AM-PAC PT 6 Clicks Mobility  Outcome Measure Help needed turning from your back to your side while in a flat bed without using bedrails?: A Lot Help needed moving from lying on your back to sitting on the side of a flat bed without using bedrails?: A Lot Help needed moving to and from a bed to a chair (including a wheelchair)?: A Lot Help needed standing up from a chair using your arms (e.g., wheelchair or bedside chair)?: A Lot Help needed to walk in hospital room?: A Lot Help needed climbing 3-5 steps with a railing? : A Lot 6 Click Score: 12    End of Session   Activity Tolerance: Patient tolerated treatment well;Patient limited by fatigue;Treatment limited secondary to agitation Patient left: in bed;with call bell/phone within reach;with bed alarm set Nurse Communication: Mobility status PT Visit Diagnosis: Unsteadiness on feet (R26.81);Other abnormalities of gait and mobility (R26.89);Muscle weakness (generalized) (M62.81)    Time: 9162-9144 PT Time Calculation (min) (ACUTE ONLY): 18 min   Charges:   PT Evaluation $  PT Eval Low Complexity: 1 Low PT Treatments $Therapeutic Activity: 8-22 mins           2:16 PM, 02/27/24 Lynwood Music, MPT Physical Therapist with [facility] [phone] office [phone] mobile phone

## 2024-02-27 NOTE — TOC Progression Note (Signed)
 Transition of Care [facility]) - Progression Note    Patient Details  Name: Anna Jacobs MRN: [id number] Date of Birth: 1941-05-30  [facility] CM/SW Contact  Hoy [name], LCSW Phone Number: 02/27/2024, 2:20 PM  Clinical Narrative:    Reviewed bed offers for SNF with pt's daughter with Medicare star ratings provided. Pt's daughter accepted offer for SNF at [facility] in [place].  Auth being requested. Pt able to transfer once auth approved.   Expected Discharge Plan: Skilled Nursing Facility Barriers to Discharge: Continued Medical Work up, SNF Pending bed offer               Expected Discharge Plan and Services In-house Referral: Clinical Social Work Discharge Planning Services: NA Post Acute Care Choice: Skilled Nursing Facility Living arrangements for the past 2 months: Assisted Living Facility                 DME Arranged: N/A DME Agency: NA                   Social Drivers of Health (SDOH) Interventions SDOH Screenings   Food Insecurity: No Food Insecurity (10/12/2023)   Received from [facility]  Transportation Needs: No Transportation Needs (10/12/2023)   Received from [facility]  Utilities: Low Risk  (10/12/2023)   Received from [facility]  Financial Resource Strain: Low Risk  (10/12/2023)   Received from [facility]  Tobacco Use: Medium Risk (02/25/2024)    Readmission Risk Interventions     No data to display

## 2024-02-27 NOTE — [place] FL2 (Signed)
 [place]  MEDICAID FL2 LEVEL OF CARE FORM     IDENTIFICATION  Patient Name: Anna Jacobs Birthdate: 01-Sep-1940 Sex: female Admission Date (Current Location): 02/25/2024  [facility] and [place] Number:  [employer] and Address:  [facility],  [address] [place] [address]      Provider Number: [id number]  Attending Physician Name and Address:  Evonnie Lenis, MD  Relative Name and Phone Number:  genevie fine (Daughter)  [phone]    Current Level of Care: Hospital Recommended Level of Care: Skilled Nursing Facility Prior Approval Number:    Date Approved/Denied:   PASRR Number: [id number]  Discharge Plan: SNF    Current Diagnoses: Patient Active Problem List   Diagnosis Date Noted   Acute cystitis 02/26/2024   Fall at home, initial encounter 02/26/2024   AKI (acute kidney injury) (HCC) 02/26/2024   Subarachnoid hemorrhage (HCC) 02/26/2024   History of seizures 02/26/2024   Facial laceration 02/26/2024   Essential hypertension 05/10/2022   Dyspnea on exertion 05/10/2022   S/P total knee arthroplasty, right 07/06/2020   Primary localized osteoarthritis of right knee 07/04/2020   Urge incontinence 01/29/2020   Incomplete emptying of bladder 01/29/2020   Lower abdominal pain 11/21/2012   Abdominal bloating 11/21/2012   Bowel habit changes 11/21/2012   Fecal incontinence 11/21/2012   IBS (irritable bowel syndrome) 09/22/2010   GERD (gastroesophageal reflux disease) 09/22/2010    Orientation RESPIRATION BLADDER Height & Weight     Self  Normal Incontinent Weight: 146 lb 2.6 oz (66.3 kg) Height:  5' 1 (154.9 cm)  BEHAVIORAL SYMPTOMS/MOOD NEUROLOGICAL BOWEL NUTRITION STATUS      Continent Diet (Heart healthy)  AMBULATORY STATUS COMMUNICATION OF NEEDS Skin   Limited Assist Verbally Normal                       Personal Care Assistance Level of Assistance  Bathing, Feeding, Dressing Bathing Assistance: Limited  assistance Feeding assistance: Limited assistance Dressing Assistance: Limited assistance     Functional Limitations Info  Sight, Hearing, Speech Sight Info: 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) Hearing Info: Impaired Speech Info: Adequate    SPECIAL CARE FACTORS FREQUENCY  PT (By licensed PT), OT (By licensed OT)     PT Frequency: 5x/wk OT Frequency: 5x/wk            Contractures Contractures Info: Not present    Additional Factors Info  Code Status, Allergies Code Status Info: FULL Allergies Info: Metronidazole, Flagyl (Metronidazole Hcl)           Current Medications (02/27/2024):  This is the current hospital active medication list Current Facility-Administered Medications  Medication Dose Route Frequency Provider Last Rate Last Admin   acetaminophen  (TYLENOL ) tablet 650 mg  650 mg Oral Q6H PRN Adefeso, Oladapo, DO       Or   acetaminophen  (TYLENOL ) suppository 650 mg  650 mg Rectal Q6H PRN Adefeso, Oladapo, DO       cefTRIAXone  (ROCEPHIN ) 1 g in sodium chloride  0.9 % 100 mL IVPB  1 g Intravenous Q24H Adefeso, Oladapo, DO 200 mL/hr at 02/27/24 1011 1 g at 02/27/24 1011   Chlorhexidine  Gluconate Cloth 2 % PADS 6 each  6 each Topical Q0600 Adefeso, Oladapo, DO   6 each at 02/27/24 9366   levETIRAcetam  (KEPPRA ) undiluted injection 500 mg  500 mg Intravenous Q24H Evonnie Lenis, MD   500 mg at 02/27/24 1045   mupirocin  ointment (BACTROBAN ) 2 %   Nasal BID Cleotilde,  Redell, MD   Given at 02/27/24 1012   ondansetron  (ZOFRAN ) tablet 4 mg  4 mg Oral Q6H PRN Adefeso, Oladapo, DO       Or   ondansetron  (ZOFRAN ) injection 4 mg  4 mg Intravenous Q6H PRN Adefeso, Oladapo, DO         Discharge Medications: Please see discharge summary for a list of discharge medications.  Relevant Imaging Results:  Relevant Lab Results:   Additional Information SSN: [id number]  Hoy [name], LCSW

## 2024-02-27 NOTE — Plan of Care (Signed)
  Problem: Acute Rehab OT Goals (only OT should resolve) Goal: Pt. Will Perform Eating Flowsheets (Taken 02/27/2024 1324) Pt Will Perform Eating: with modified independence Goal: Pt. Will Perform Grooming Flowsheets (Taken 02/27/2024 1324) Pt Will Perform Grooming:  with set-up  sitting Goal: Pt. Will Perform Upper Body Dressing Flowsheets (Taken 02/27/2024 1324) Pt Will Perform Upper Body Dressing:  with set-up  sitting Goal: Pt. Will Perform Lower Body Dressing Flowsheets (Taken 02/27/2024 1324) Pt Will Perform Lower Body Dressing:  with contact guard assist  sitting/lateral leans Goal: Pt. Will Transfer To Toilet Flowsheets (Taken 02/27/2024 1324) Pt Will Transfer to Toilet:  with supervision  ambulating Goal: Pt. Will Perform Toileting-Clothing Manipulation Flowsheets (Taken 02/27/2024 1324) Pt Will Perform Toileting - Clothing Manipulation and hygiene:  with contact guard assist  with supervision  sitting/lateral leans Goal: Pt/Caregiver Will Perform Home Exercise Program Flowsheets (Taken 02/27/2024 1324) Pt/caregiver will Perform Home Exercise Program:  Increased strength  Increased ROM  Both right and left upper extremity  With minimal assist  Chauntelle Azpeitia OT, MOT

## 2024-02-27 NOTE — Plan of Care (Signed)
  Problem: Acute Rehab PT Goals(only PT should resolve) Goal: Pt Will Go Supine/Side To Sit Outcome: Progressing Flowsheets (Taken 02/27/2024 1419) Pt will go Supine/Side to Sit:  with contact guard assist  with minimal assist Goal: Patient Will Transfer Sit To/From Stand Outcome: Progressing Flowsheets (Taken 02/27/2024 1419) Patient will transfer sit to/from stand: with minimal assist Goal: Pt Will Transfer Bed To Chair/Chair To Bed Outcome: Progressing Flowsheets (Taken 02/27/2024 1419) Pt will Transfer Bed to Chair/Chair to Bed: with min assist Goal: Pt Will Ambulate Outcome: Progressing Flowsheets (Taken 02/27/2024 1419) Pt will Ambulate:  25 feet  with moderate assist  with minimal assist  with rolling walker   2:20 PM, 02/27/24 Lynwood Music, MPT Physical Therapist with [facility] [phone] office [phone] mobile phone

## 2024-02-27 NOTE — Evaluation (Addendum)
 Occupational Therapy Evaluation Patient Details Name: Anna Jacobs MRN: [id number] DOB: 08/21/1940 Today's Date: 02/27/2024   History of Present Illness   Anna Jacobs is an 83 y.o. female with medical history significant of hypertension, hyperlipidemia, dementia, seizure, recurrent UTI who presents to the emergency department from SNF via EMS due to a fall.  At bedside, patient was unable to provide history possibly due to being  somnolent, though arousable, but quickly goes back to sleep, she also has a history of dementia.  History was obtained from EDP and ED medical record.  Per report, patient fell out of chair and landed with face first onto the floor and she sustained laceration of right sided forehead.  Apparently, patient was recently admitted to the skilled nursing facility about 2 weeks ago and it was unknown to the nursing staff that patient has home medications, so she has not received any medication since she arrived to this facility. (per DO)     Clinical Impressions Pt not oriented and talking to people not in the room. Pt able to be redirected to some functional mobility. Difficult to fully assess the pt today due to poor cognition. Much tactile curing with extended time needed for bed mobility and transfer to and from chair with mod A. Pt wearing mittens throughout the session due to mild agitation and pt pinching at the bed at times. Difficult to assess B UE functional use and strength today due to cognition. Much assist for ADL's, but cognitive deficits play a large role in the level of assist needed. Pt also noted to have nystagmus at all times during the session with more ability to focus vision at midline.  Pt will benefit from continued OT in the hospital and recommended venue below to increase strength, balance, and endurance for safe ADL's.        If plan is discharge home, recommend the following:   A lot of help with walking and/or transfers;A lot of help with  bathing/dressing/bathroom;Assistance with cooking/housework;Assistance with feeding;Assist for transportation;Help with stairs or ramp for entrance     Functional Status Assessment   Patient has had a recent decline in their functional status and demonstrates the ability to make significant improvements in function in a reasonable and predictable amount of time.     Equipment Recommendations   None recommended by OT             Precautions/Restrictions   Precautions Precautions: Fall Recall of Precautions/Restrictions: Impaired Restrictions Weight Bearing Restrictions Per Provider Order: No     Mobility Bed Mobility Overal bed mobility: Needs Assistance Bed Mobility: Supine to Sit     Supine to sit: Mod assist     General bed mobility comments: labored; assist to come to sit and return to supine; limited by cognition    Transfers Overall transfer level: Needs assistance Equipment used:  (mittens) Transfers: Sit to/from Stand, Bed to chair/wheelchair/BSC Sit to Stand: Mod assist, Min assist     Step pivot transfers: Mod assist     General transfer comment: EOB to chair and back without AD      Balance Overall balance assessment: Needs assistance Sitting-balance support: No upper extremity supported, Feet supported Sitting balance-Leahy Scale: Fair Sitting balance - Comments: seated at EOB   Standing balance support: During functional activity, Bilateral upper extremity supported Standing balance-Leahy Scale: Poor Standing balance comment: poor to fair with hand held assist/support under shoulders  ADL either performed or assessed with clinical judgement   ADL Overall ADL's : Needs assistance/impaired Eating/Feeding: Minimal assistance;Sitting   Grooming: Moderate assistance;Maximal assistance;Sitting   Upper Body Bathing: Moderate assistance;Maximal assistance;Sitting   Lower Body Bathing: Maximal  assistance;Sitting/lateral leans;Total assistance   Upper Body Dressing : Moderate assistance;Maximal assistance;Sitting   Lower Body Dressing: Maximal assistance;Sitting/lateral leans;Total assistance Lower Body Dressing Details (indicate cue type and reason): Assisted to don socks while seated at EOB. Toilet Transfer: Moderate assistance;Stand-pivot Toilet Transfer Details (indicate cue type and reason): Single to two hand held assist for EOB to chair transfer and back. Toileting- Clothing Manipulation and Hygiene: Maximal assistance;Total assistance;Bed level       Functional mobility during ADLs: Moderate assistance General ADL Comments: Pt is severely limited by cognition. Baseline dementia and recent head trauma.     Vision Baseline Vision/History:  (unsure of baseline vision) Ability to See in Adequate Light:  (unsure; pt is a poor historian today) Vision Assessment?: Vision impaired- to be further tested in functional context Additional Comments: Pt noted to have nystagmus at all times, even when eyes were closed today. Pt also struggled to focus vision at midline but rather often shifted from left to to right. Pt cognition does not allow for more formal testing at this time.     Perception Perception: Not tested       Praxis Praxis: Not tested       Pertinent Vitals/Pain Pain Assessment Pain Assessment: Faces Faces Pain Scale: No hurt     Extremity/Trunk Assessment Upper Extremity Assessment Upper Extremity Assessment: Difficult to assess due to impaired cognition   Lower Extremity Assessment Lower Extremity Assessment: Defer to PT evaluation   Cervical / Trunk Assessment Cervical / Trunk Assessment: Kyphotic   Communication Communication Communication: No apparent difficulties   Cognition Arousal: Alert Behavior During Therapy: Agitated, Impulsive Cognition: History of cognitive impairments, No family/caregiver present to determine baseline              OT - Cognition Comments: Pt talking to people that were not in the room. Pt generally confused and agitated, but able to be redirected to some functional assessment tasks like a transfer to and from the chair.                 Following commands: Intact       Cueing  General Comments   Cueing Techniques: Verbal cues;Tactile cues;Visual cues;Gestural cues                 Home Living Family/patient expects to be discharged to:: Assisted living                                 Additional Comments: Pt is a poor historian.      Prior Functioning/Environment Prior Level of Function : Patient poor historian/Family not available             Mobility Comments: Unknown. Pt from ALF. ADLs Comments: Unknown. Pt from ALF.    OT Problem List: Decreased strength;Decreased range of motion;Decreased activity tolerance;Impaired balance (sitting and/or standing);Impaired vision/perception;Decreased cognition;Decreased safety awareness;Decreased knowledge of use of DME or AE   OT Treatment/Interventions: Self-care/ADL training;Therapeutic exercise;DME and/or AE instruction;Therapeutic activities;Cognitive remediation/compensation;Visual/perceptual remediation/compensation;Patient/family education;Balance training      OT Goals(Current goals can be found in the care plan section)   Acute Rehab OT Goals Patient Stated Goal: none stated OT Goal Formulation: Patient unable to participate in  goal setting Time For Goal Achievement: 03/12/24 Potential to Achieve Goals: Fair   OT Frequency:  Min 3X/week    Co-evaluation PT/OT/SLP Co-Evaluation/Treatment: Yes Reason for Co-Treatment: To address functional/ADL transfers   OT goals addressed during session: ADL's and self-care      AM-PAC OT 6 Clicks Daily Activity     Outcome Measure Help from another person eating meals?: A Little Help from another person taking care of personal grooming?: A Lot Help from  another person toileting, which includes using toliet, bedpan, or urinal?: A Lot Help from another person bathing (including washing, rinsing, drying)?: A Lot Help from another person to put on and taking off regular upper body clothing?: A Lot Help from another person to put on and taking off regular lower body clothing?: A Lot 6 Click Score: 13   End of Session    Activity Tolerance: Treatment limited secondary to agitation Patient left: in bed;with call bell/phone within reach;with bed alarm set  OT Visit Diagnosis: Unsteadiness on feet (R26.81);Other abnormalities of gait and mobility (R26.89);Muscle weakness (generalized) (M62.81);History of falling (Z91.81);Other symptoms and signs involving cognitive function                Time: 9159-9145 OT Time Calculation (min): 14 min Charges:  OT General Charges $OT Visit: 1 Visit OT Evaluation $OT Eval Low Complexity: 1 Low   Danta Baumgardner OT, MOT  Jayson Person 02/27/2024, 1:21 PM

## 2024-02-27 NOTE — Procedures (Signed)
 Patient Name: Anna Jacobs  MRN: [id number]  Epilepsy Attending: Arlin MALVA Krebs  Referring Physician/Provider: Adefeso, Oladapo, DO  Date: 02/27/2024 Duration: 23.10 mins  Patient history: 83yo F with seizure like activity. EEG to evaluate for seizure  Level of alertness: Awake  AEDs during EEG study: LEV  Technical aspects: This EEG study was done with scalp electrodes positioned according to the 10-20 International system of electrode placement. Electrical activity was reviewed with band pass filter of 1-70Hz , sensitivity of 7 uV/mm, display speed of 63mm/sec with a 60Hz  notched filter applied as appropriate. EEG data were recorded continuously and digitally stored.  Video monitoring was available and reviewed as appropriate.  Description: The posterior dominant rhythm consists of 8 Hz activity of moderate voltage (25-35 uV) seen predominantly in posterior head regions, symmetric and reactive to eye opening and eye closing. EEG showed intermittent generalized 3 to 6 Hz theta-delta slowing admixed with 15 to 18 Hz beta activity distributed symmetrically and diffusely. Hyperventilation and photic stimulation were not performed.     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 No seizures or epileptiform discharges were seen throughout the recording.  Devonte Migues O Link Burgeson

## 2024-02-28 DIAGNOSIS — N3000 Acute cystitis without hematuria: Secondary | ICD-10-CM | POA: Diagnosis not present

## 2024-02-28 DIAGNOSIS — N179 Acute kidney failure, unspecified: Secondary | ICD-10-CM | POA: Diagnosis not present

## 2024-02-28 DIAGNOSIS — R569 Unspecified convulsions: Secondary | ICD-10-CM | POA: Diagnosis not present

## 2024-02-28 DIAGNOSIS — S066X0A Traumatic subarachnoid hemorrhage without loss of consciousness, initial encounter: Secondary | ICD-10-CM | POA: Diagnosis not present

## 2024-02-28 DIAGNOSIS — I609 Nontraumatic subarachnoid hemorrhage, unspecified: Secondary | ICD-10-CM | POA: Diagnosis not present

## 2024-02-28 LAB — GLUCOSE, CAPILLARY: Glucose-Capillary: 75 mg/dL (ref 70–99)

## 2024-02-28 MED ORDER — LORAZEPAM 0.5 MG PO TABS: 0.5000 mg | ORAL_TABLET | Freq: Every day | ORAL | 0 refills | Status: DC | PRN

## 2024-02-28 MED ORDER — LORAZEPAM 0.5 MG PO TABS: 0.5000 mg | ORAL_TABLET | Freq: Every day | ORAL | 0 refills | Status: AC | PRN

## 2024-02-28 MED ORDER — LEVETIRACETAM 250 MG PO TABS: 250.0000 mg | ORAL_TABLET | Freq: Two times a day (BID) | ORAL | Status: AC

## 2024-02-28 MED ORDER — CEFUROXIME AXETIL 500 MG PO TABS: 500.0000 mg | ORAL_TABLET | Freq: Two times a day (BID) | ORAL | Status: AC

## 2024-02-28 NOTE — Progress Notes (Signed)
 Occupational Therapy Treatment Patient Details Name: Anna Jacobs MRN: [id number] DOB: 04-14-1941 Today's Date: 02/28/2024   History of present illness Anna Jacobs is an 83 y.o. female with medical history significant of hypertension, hyperlipidemia, dementia, seizure, recurrent UTI who presents to the emergency department from SNF via EMS due to a fall.  At bedside, patient was unable to provide history possibly due to being  somnolent, though arousable, but quickly goes back to sleep, she also has a history of dementia.  History was obtained from EDP and ED medical record.  Per report, patient fell out of chair and landed with face first onto the floor and she sustained laceration of right sided forehead.  Apparently, patient was recently admitted to the skilled nursing facility about 2 weeks ago and it was unknown to the nursing staff that patient has home medications, so she has not received any medication since she arrived to this facility.   OT comments  Pt agreeable to OT and PT co-treatment due to complexity of pt's deficits. Pt was much more pleasant and agreeable today. Pt followed commands intermittently with tactile and verbal cuing. Pt kept her eyes shut nearly the entire session today, but conversed and engaged in session tasks. Mod A for bed mobility and transfers with RW. Pt able to ambulate a short distance in the room with the RW. Pt also able to engaged in B UE strengthening via AA/ROM exercises in which the pt could nearly complete the exercises as more A/ROM but needed the tactile cuing. Mild AA/ROM limitation noted to R shoulder today at the top of the range for flexion and abduction. Pt left in the chair with chair alarm set and call bell within reach. UE work completed after therapy left the session. Pt will benefit from continued OT in the hospital and recommended venue below to increase strength, balance, and endurance for safe ADL's.         If plan is discharge home,  recommend the following:  A lot of help with walking and/or transfers;A lot of help with bathing/dressing/bathroom;Assistance with cooking/housework;Assistance with feeding;Assist for transportation;Help with stairs or ramp for entrance   Equipment Recommendations  None recommended by OT          Precautions / Restrictions Precautions Precautions: Fall Recall of Precautions/Restrictions: Impaired Restrictions Weight Bearing Restrictions Per Provider Order: No       Mobility Bed Mobility Overal bed mobility: Needs Assistance Bed Mobility: Supine to Sit     Supine to sit: Mod assist     General bed mobility comments: increased time, labored movement    Transfers Overall transfer level: Needs assistance Equipment used: Rolling walker (2 wheels) Transfers: Sit to/from Stand, Bed to chair/wheelchair/BSC Sit to Stand: Min assist, Mod assist     Step pivot transfers: Mod assist     General transfer comment: EOB to BSC; eyes closed during transfer with RW; unsteady     Balance Overall balance assessment: Needs assistance Sitting-balance support: Feet supported, No upper extremity supported Sitting balance-Leahy Scale: Fair Sitting balance - Comments: seated at EOB   Standing balance support: Reliant on assistive device for balance, During functional activity, Bilateral upper extremity supported Standing balance-Leahy Scale: Poor Standing balance comment: fair to poor using RW                           ADL either performed or assessed with clinical judgement   ADL Overall ADL's : Needs assistance/impaired  Toilet Transfer: Moderate assistance;Stand-pivot;Rolling walker (2 wheels);BSC/3in1;Ambulation Toilet Transfer Details (indicate cue type and reason): EOB to [facility] and then to chair.         Functional mobility during ADLs: Moderate assistance;Rolling walker (2 wheels) General ADL Comments: Able to ambulate ~6 to 8  feet total forward and backward in the room with RW.    Extremity/Trunk Assessment Upper Extremity Assessment Upper Extremity Assessment: Generalized weakness;RUE deficits/detail RUE Deficits / Details: With AA/ROM pt's R UE appeared to be mildly limited in AA/ROM a the top of the range.   Lower Extremity Assessment Lower Extremity Assessment: Defer to PT evaluation        Vision   Vision Assessment?: Vision impaired- to be further tested in functional context Additional Comments: Pt kept her eyese closed for nearly the entire session.             Communication Communication Communication: No apparent difficulties   Cognition Arousal: Alert Behavior During Therapy: Anxious, Flat affect Cognition: History of cognitive impairments, No family/caregiver present to determine baseline             OT - Cognition Comments: Pt able to follow commands at times if given tactile cuing and time. Pt confused but not aggressive or agitated today.                 Following commands: Impaired Following commands impaired: Follows one step commands with increased time, Follows one step commands inconsistently      Cueing   Cueing Techniques: Verbal cues, Tactile cues  Exercises                   Pertinent Vitals/ Pain       Pain Assessment Pain Assessment: No/denies pain                                                          Frequency  Min 3X/week        Progress Toward Goals  OT Goals(current goals can now be found in the care plan section)  Progress towards OT goals: Progressing toward goals  Acute Rehab OT Goals Patient Stated Goal: none stated OT Goal Formulation: Patient unable to participate in goal setting Time For Goal Achievement: 03/12/24 Potential to Achieve Goals: Fair ADL Goals Pt Will Perform Eating: with modified independence Pt Will Perform Grooming: with set-up;sitting Pt Will Perform Upper Body Dressing: with  set-up;sitting Pt Will Perform Lower Body Dressing: with contact guard assist;sitting/lateral leans Pt Will Transfer to Toilet: with supervision;ambulating Pt Will Perform Toileting - Clothing Manipulation and hygiene: with contact guard assist;with supervision;sitting/lateral leans Pt/caregiver will Perform Home Exercise Program: Increased strength;Increased ROM;Both right and left upper extremity;With minimal assist  Plan      Co-evaluation    PT/OT/SLP Co-Evaluation/Treatment: Yes Reason for Co-Treatment: To address functional/ADL transfers PT goals addressed during session: Mobility/safety with mobility;Balance;Proper use of DME OT goals addressed during session: ADL's and self-care                         End of Session Equipment Utilized During Treatment: Rolling walker (2 wheels);Gait belt  OT Visit Diagnosis: Unsteadiness on feet (R26.81);Other abnormalities of gait and mobility (R26.89);Muscle weakness (generalized) (M62.81);History of falling (Z91.81);Other symptoms and signs involving cognitive function   Activity  Tolerance Patient tolerated treatment well   Patient Left in chair;with call bell/phone within reach;with chair alarm set   Nurse Communication Mobility status        Time: 8646-8576 OT Time Calculation (min): 30 min  Charges: OT General Charges $OT Visit: 1 Visit OT Treatments $Therapeutic Exercise: 8-22 mins  Roen Macgowan OT, MOT  Jayson Person 02/28/2024, 2:49 PM

## 2024-02-28 NOTE — Discharge Summary (Addendum)
 Physician Discharge Summary   Patient: Anna Jacobs MRN: [id number] DOB: 12/21/1940  Admit date:     02/25/2024  Discharge date: 02/28/24  Discharge Physician: Alm Emonie Espericueta   PCP: Trudy Vaughn FALCON, MD   Recommendations at discharge:   Please follow up with primary care provider within 1-2 weeks  Please repeat BMP and CBC in one week   Hospital Course: 83 year old female with a history of hypertension, hyperlipidemia, seizure disorder, dementia, overactive bladder presenting from her skilled nursing facility after a fall.  Apparently, she has been at the nursing facility for about 2 weeks.  The patient apparently had a fall of a chair and went face first onto the floor. Due to the patient's dementia, she is unable to provide any significant history.  History is obtained from review of the medical record. Apparently, the patient was admitted to [facility] from 10/11/2023 to 5-25 after family noted seizure-like activity at home.  At that time, she was noted to have bitten her tongue.  She was initially treated with Keppra  500 mg twice daily, but the patient apparently was somnolent and her Keppra  was felt to be contributing.  Notably her Seroquel was discontinued at the same time.  The patient was discharged home with Keppra  250 mg daily.  UTI at that time was felt to be contributing.  Ultimately her mental status improved and the patient interacted with the staff.  She was discharged home at that time because she was denied for SNF.  According to the discharge summary, her only discharge medication at that time was aspirin  81 mg, Keppra  250 mg daily, and Seroquel 25 mg at night.  In seeing the patient on the morning of 02/26/2024 the patient is resting, but arouses easily to voice.  Upon asking the patient questions, the patient states  bothering me.  Upon examining the patient, the patient states  get your goddamn hands off me.  She continued to use multiple expletives expressing her displeasure with  a variety of complaints.  In the ED, the patient was afebrile hemodynamically stable with oxygen saturation 90% room air.  WBC 6.5, hemoglobin 14.0, platelet 249.  Sodium 139, potassium 4.0, bicarbonate 24, serum creatinine 1.20.  LFTs are unremarkable.  CT of the brain showed a trace amount of subarachnoid hemorrhage in the right parietal sulcus.  Neurosurgery was consulted.  Dr. Colon felt the patient was stable for discharge.  However because of increased serum creatinine and pyuria, the patient was admitted for further evaluation and treatment. CT of the cervical spine was negative for fracture or listhesis.  In the morning 02/27/24, daughter ultimately returned my call.  She states there is concern that pt had not been receiving her keppra  at [facility] for several days.  She states pt was witness having tonic activity before she fell out of her chair.  Daughter states pt is taking keppra  250 mg bid at baseline.  She states that pt is confused at baseline but to interact.  On the morning of 02/28/2024, the patient was awake and alert.  She was interactive although she remained pleasantly confused.  She was less agitated.  The patient was medically cleared for discharge to skilled nursing facility.   Assessment and Plan: Subarachnoid hemorrhage - EDP spoke with neurosurgery, Dr. Rozetta no further imaging needed -Laceration was repaired on arrival with 5 sutures of 5-0 Ethilon on 02/25/2024 in the ED   Pyuria - Concern for cystitis - Urine culture was not sent - UA 11-20 WBC +  nitrite - Continue empiric ceftriaxone  for now based on prior urine culture data -She received 3 days of ceftriaxone  during hospitalization -She will be discharged with 2 additional days of cefuroxime    AKI - Baseline creatinine 0.6-0.8 - Presented with serum creatinine 1.20 - Continue IV fluids>> improved -Serum creatinine 0.72 on the day of discharge   Fall with scalp laceration - PT evaluation>>SNF - Obtain  EEG--no seizure - Check CK--162 -Laceration was repaired on arrival with 5 sutures of 5-0 Ethilon on 02/25/2024 in the ED   Seizure disorder - Unclear circumstances around patient's fall but suspicious for seizure according to daughter's hx - Obtain EEG--no seizure - Continue Keppra  250 mg bid - 9/8 based upon hx provided by daughter>>restart Keppra  250 mg bid   Major neurocognitive disorder -The patient is not on any medications -She is at risk for delirium -B12--1890 -Folic acid --11.8 -TSH--0.411   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   cefUROXime  500 MG tablet Commonly known as: CEFTIN  Take 1 tablet (500 mg total) by mouth 2 (two) times daily with a meal. X 2 days   docusate sodium  100 MG capsule Commonly known as: Colace Take 1 capsule (100 mg total) by mouth 2 (two) times daily. Take as needed for hard stools   lactose free nutrition Liqd Take 237 mLs by mouth 2 (two) times daily between meals.   levETIRAcetam  250 MG tablet Commonly known as: Keppra  Take 1 tablet (250 mg total) by mouth 2 (two) times daily.   LORazepam  0.5 MG tablet Commonly known as: ATIVAN  Take 1 tablet (0.5 mg total) by mouth daily as needed for anxiety.   mupirocin  ointment 2 % Commonly known as: BACTROBAN  Apply 1 Application topically 2 (two) times daily.   nitrofurantoin  50 MG capsule Commonly known as: Macrodantin  Take 1 capsule (50 mg total) by mouth at bedtime.   sertraline 25 MG tablet Commonly known as: ZOLOFT Take 25 mg by mouth daily.    vitamin B-12 500 MCG tablet Commonly known as: CYANOCOBALAMIN  Take 500 mcg by mouth daily.   Vitamin D 50 MCG (2000 UT) tablet Take 2,000 Units by mouth daily.        Contact information for follow-up providers     Trudy Vaughn FALCON, MD.   Specialty: Internal Medicine Why: for suture removal in 10 days Contact information: [address] [place] [address] [phone]              Contact information for after-discharge care     Destination     [facility] .   Service: Skilled Nursing Contact information: [address] [place]  [address] [phone]                    Discharge Exam: Fredricka Weights   02/25/24 2220  Weight: 66.3 kg   HEENT:  [place]/AT, No thrush, no icterus CV:  RRR, no rub, no S3, no S4 Lung:  bibasilar crackles.  No wheeze Abd:  soft/+BS, NT Ext:  No edema, no lymphangitis, no synovitis, no rash   Condition at discharge: stable  The results of significant diagnostics from this hospitalization (including imaging, microbiology, ancillary and laboratory) are listed below for reference.   Imaging Studies: EEG adult Result Date: 02/27/2024 Shelton Arlin KIDD, MD     02/27/2024  2:20 PM Patient Name: RHONDA LINAN MRN: [id number] Epilepsy Attending: Arlin KIDD Shelton Referring Physician/Provider: Adefeso, Oladapo, DO Date: 02/27/2024 Duration: 23.10 mins Patient history: 83yo F with seizure like activity. EEG to evaluate for seizure Level of alertness: Awake AEDs during EEG study: LEV Technical aspects: This EEG study was done with scalp electrodes positioned according to the 10-20 International system of electrode placement. Electrical activity was reviewed with band pass filter of 1-70Hz , sensitivity of 7 uV/mm, display speed of 65mm/sec with a 60Hz  notched filter applied as appropriate. EEG data were recorded continuously and digitally stored.  Video monitoring was available and reviewed as appropriate.  Description: The posterior dominant rhythm consists of 8 Hz activity of moderate voltage (25-35 uV) seen predominantly in posterior head regions, symmetric and reactive to eye opening and eye closing. EEG showed intermittent generalized 3 to 6 Hz theta-delta slowing admixed with 15 to 18 Hz beta activity distributed symmetrically and diffusely. Hyperventilation and photic stimulation were not performed.   ABNORMALITY - Intermittent slow, generalized IMPRESSION: This study is suggestive of mild diffuse encephalopathy. No seizures or epileptiform discharges were seen throughout the recording. Priyanka O Yadav   CT Head Wo Contrast Result Date: 02/25/2024 CLINICAL DATA:  Facial trauma, blunt EXAM: CT HEAD WITHOUT CONTRAST CT CERVICAL SPINE WITHOUT CONTRAST TECHNIQUE: Multidetector CT imaging of the head and cervical spine was performed following the standard protocol without intravenous contrast. Multiplanar CT image reconstructions of the cervical spine were also generated. RADIATION DOSE REDUCTION: This exam was performed according to the departmental dose-optimization program which includes automated exposure control, adjustment of the mA and/or kV according to patient size and/or use of iterative reconstruction technique. COMPARISON:  August 07, 2023 CT head and CT cervical spine. FINDINGS: CT HEAD FINDINGS Brain: Trace subarachnoid hemorrhage in a high right parietal sulcus (see series 3, image 14 and series 6, image 53). No evidence of acute large vascular territory infarct, hydrocephalus, extra-axial collection or mass lesion/mass effect. Patchy white matterHypodensities are nonspecific but compatible with at least moderate chronic microvascular ischemic change. Cerebral atrophy. Vascular: Calcific atherosclerosis.  No hyperdense vessel. Skull: No acute fracture.  Left forehead contusion Sinuses/Orbits: Clear sinuses.  No acute orbital findings. CT CERVICAL SPINE FINDINGS Alignment: Mild grade 1  anterolisthesis of C4 on C5 and C7 on T1 is most likely degenerative in etiology given facet arthropathy at these levels. Otherwise, no substantial sagittal subluxation. Skull base and vertebrae: No evidence of acute fracture. Vertebral body heights are maintained. Soft tissues and spinal canal: No prevertebral fluid or swelling. No visible canal hematoma. Disc levels: Multilevel degenerative disc disease including disc height loss and endplate spurring. Facet and uncovertebral hypertrophy contributes to varying degrees of neural foraminal stenosis, likely severe on the right at C4-C5. Upper chest: Visualized lung apices are clear. IMPRESSION: 1. Trace acute subarachnoid hemorrhage in a high right parietal sulcus. No mass effect. 2. No evidence of acute fracture or traumatic malalignment in the cervical spine. 3. Multilevel degenerative change including likely severe foraminal stenosis on the right at C4-C5. MRI could further characterize if clinically warranted. Findings discussed with Dr. Theadore via telephone at 11:21 PM. Electronically  Signed   By: Gilmore GORMAN Molt M.D.   On: 02/25/2024 23:23   CT Cervical Spine Wo Contrast Result Date: 02/25/2024 CLINICAL DATA:  Facial trauma, blunt EXAM: CT HEAD WITHOUT CONTRAST CT CERVICAL SPINE WITHOUT CONTRAST TECHNIQUE: Multidetector CT imaging of the head and cervical spine was performed following the standard protocol without intravenous contrast. Multiplanar CT image reconstructions of the cervical spine were also generated. RADIATION DOSE REDUCTION: This exam was performed according to the departmental dose-optimization program which includes automated exposure control, adjustment of the mA and/or kV according to patient size and/or use of iterative reconstruction technique. COMPARISON:  August 07, 2023 CT head and CT cervical spine. FINDINGS: CT HEAD FINDINGS Brain: Trace subarachnoid hemorrhage in a high right parietal sulcus (see series 3, image 14 and series 6,  image 53). No evidence of acute large vascular territory infarct, hydrocephalus, extra-axial collection or mass lesion/mass effect. Patchy white matterHypodensities are nonspecific but compatible with at least moderate chronic microvascular ischemic change. Cerebral atrophy. Vascular: Calcific atherosclerosis.  No hyperdense vessel. Skull: No acute fracture.  Left forehead contusion Sinuses/Orbits: Clear sinuses.  No acute orbital findings. CT CERVICAL SPINE FINDINGS Alignment: Mild grade 1 anterolisthesis of C4 on C5 and C7 on T1 is most likely degenerative in etiology given facet arthropathy at these levels. Otherwise, no substantial sagittal subluxation. Skull base and vertebrae: No evidence of acute fracture. Vertebral body heights are maintained. Soft tissues and spinal canal: No prevertebral fluid or swelling. No visible canal hematoma. Disc levels: Multilevel degenerative disc disease including disc height loss and endplate spurring. Facet and uncovertebral hypertrophy contributes to varying degrees of neural foraminal stenosis, likely severe on the right at C4-C5. Upper chest: Visualized lung apices are clear. IMPRESSION: 1. Trace acute subarachnoid hemorrhage in a high right parietal sulcus. No mass effect. 2. No evidence of acute fracture or traumatic malalignment in the cervical spine. 3. Multilevel degenerative change including likely severe foraminal stenosis on the right at C4-C5. MRI could further characterize if clinically warranted. Findings discussed with Dr. Theadore via telephone at 11:21 PM. Electronically Signed   By: Gilmore GORMAN Molt M.D.   On: 02/25/2024 23:23    Microbiology: Results for orders placed or performed in visit on 11/28/23  Microscopic Examination     Status: Abnormal   Collection Time: 11/28/23  2:35 PM   Urine  Result Value Ref Range Status   WBC, UA 6-10 (A) 0 - 5 /hpf Final   RBC, Urine 3-10 (A) 0 - 2 /hpf Final   Epithelial Cells (non renal) 0-10 0 - 10 /hpf Final    Bacteria, UA Many (A) None seen/Few Final  Urine Culture     Status: Abnormal   Collection Time: 11/28/23  3:16 PM   Specimen: Urine   UR  Result Value Ref Range Status   Urine Culture, Routine Final report (A)  Final   Organism ID, Bacteria Comment (A)  Final    Comment: Escherichia coli, identified by an automated biochemical system. Cefazolin  with an MIC <=16 predicts susceptibility to the oral agents cefaclor, cefdinir, cefpodoxime, cefprozil, cefuroxime , cephalexin, and loracarbef when used for therapy of uncomplicated urinary tract infections due to E. coli, Klebsiella pneumoniae, and Proteus mirabilis. Greater than 100,000 colony forming units per mL    ORGANISM ID, BACTERIA Klebsiella pneumoniae (A)  Final    Comment: Cefazolin  with an MIC <=16 predicts susceptibility to the oral agents cefaclor, cefdinir, cefpodoxime, cefprozil, cefuroxime , cephalexin, and loracarbef when used for therapy of uncomplicated urinary tract  infections due to E. coli, Klebsiella pneumoniae, and Proteus mirabilis. Greater than 100,000 colony forming units per mL    Antimicrobial Susceptibility Comment  Final    Comment:       ** S = Susceptible; I = Intermediate; R = Resistant **                    P = Positive; N = Negative             MICS are expressed in micrograms per mL    Antibiotic                 RSLT#1    RSLT#2    RSLT#3    RSLT#4 Amoxicillin/Clavulanic Acid    S         S Ampicillin                     S         R Cefazolin                       S         S Cefepime                       S         S Cefoxitin                      S         S Cefpodoxime                    S         S Ceftriaxone                     S         S Ciprofloxacin                  R         S Ertapenem                      S         S Gentamicin                     S         S Levofloxacin                   R         S Meropenem                      S         S Nitrofurantoin                  S          R Piperacillin/Tazobactam        S         S Tetracycline                   S         S Tobramycin                     S         S Trimethoprim /Sulfa              S  S     Labs: CBC: Recent Labs  Lab 02/25/24 2325 02/26/24 0343  WBC 6.5 7.0  NEUTROABS 5.5  --   HGB 14.0 13.9  HCT 43.0 43.0  MCV 92.3 92.3  PLT 249 215   Basic Metabolic Panel: Recent Labs  Lab 02/25/24 2325 02/26/24 0343 02/27/24 0404  NA 139 137 138  K 4.0 3.5 4.0  CL 103 106 108  CO2 24 22 23   GLUCOSE 105* 87 85  BUN 53* 47* 29*  CREATININE 1.20* 0.96 0.72  CALCIUM  9.5 9.0 8.8*  MG  --  2.3 2.0  PHOS  --  3.0  --    Liver Function Tests: Recent Labs  Lab 02/25/24 2325 02/26/24 0343  AST 19 17  ALT 14 14  ALKPHOS 55 53  BILITOT 1.5* 1.2  PROT 6.8 6.6  ALBUMIN 4.3 3.9   CBG: Recent Labs  Lab 02/28/24 0623  GLUCAP 75    Discharge time spent: greater than 30 minutes.  Signed: Alm Schneider, MD [facility] 02/28/2024

## 2024-02-28 NOTE — TOC Progression Note (Signed)
 Transition of Care [facility]) - Progression Note    Patient Details  Name: Anna Jacobs MRN: [id number] Date of Birth: 1941-04-26  Transition of [facility]) CM/SW Contact  [place] S[place]ie Kim, [place] Phone Number: 02/28/2024, 11:42 AM  Clinical Narrative: LCSW received message from [facility] that they came to visit pt this morning and pt was in mitts. Facility unable to accept pt until 48 hour physical/chemical restraint free. MD and RN updated and mitts have been removed. Daughter updated. Anticipate d/c 9/11 to [facility].   SNF reports insurance requesting PLOF on evaluation. PT notified and updating this information. LCSW will send to SNF when completed.       Expected Discharge Plan: Skilled Nursing Facility Barriers to Discharge: Continued Medical Work up, SNF Pending bed offer               Expected Discharge Plan and Services In-house Referral: Clinical Social Work Discharge Planning Services: NA Post Acute Care Choice: Skilled Nursing Facility Living arrangements for the past 2 months: Assisted Living Facility Expected Discharge Date: 02/28/24               DME Arranged: N/A DME Agency: NA                   Social Drivers of Health (SDOH) Interventions SDOH Screenings   Food Insecurity: No Food Insecurity (10/12/2023)   Received from [facility]  Transportation Needs: No Transportation Needs (10/12/2023)   Received from [facility]  Utilities: Low Risk  (10/12/2023)   Received from [facility]  Financial Resource Strain: Low Risk  (10/12/2023)   Received from [facility]  Tobacco Use: Medium Risk (02/25/2024)    Readmission Risk Interventions     No data to display

## 2024-02-28 NOTE — Progress Notes (Signed)
 Physical Therapy Treatment Patient Details Name: Anna Jacobs MRN: [id number] DOB: [birth year] Today's Date: 02/28/2024   History of Present Illness Anna Jacobs is an 83 y.o. female with medical history significant of hypertension, hyperlipidemia, dementia, seizure, recurrent UTI who presents to the emergency department from SNF via EMS due to a fall.  At bedside, patient was unable to provide history possibly due to being  somnolent, though arousable, but quickly goes back to sleep, she also has a history of dementia.  History was obtained from EDP and ED medical record.  Per report, patient fell out of chair and landed with face first onto the floor and she sustained laceration of right sided forehead.  Apparently, patient was recently admitted to the skilled nursing facility about 2 weeks ago and it was unknown to the nursing staff that patient has home medications, so she has not received any medication since she arrived to this facility.    PT Comments  Patient present slightly lethargic and agreeable for therapy, but has difficulty keeping eyes open.  Patient demonstrates slow labored movement for sitting up at bedside, once seated able to maintain sitting balance and demonstrated increased tolerance for taking steps forward/backwards at bedside, but limited mostly due to fatigue and BLE weakness. Patient tolerated sitting up in chair after therapy while working with OT. Patient will benefit from continued skilled physical therapy in hospital and recommended venue below to increase strength, balance, endurance for safe ADLs and gait.   PLOF: Patient was ambulating using SPC and dressing herself independently per family.      If plan is discharge home, recommend the following: A lot of help with bathing/dressing/bathroom;A lot of help with walking and/or transfers;Help with stairs or ramp for entrance;Assist for transportation   Can travel by private vehicle     No  Equipment  Recommendations  Rolling walker (2 wheels);None recommended by PT    Recommendations for Other Services       Precautions / Restrictions Precautions Precautions: Fall Recall of Precautions/Restrictions: Impaired Restrictions Weight Bearing Restrictions Per Provider Order: No     Mobility  Bed Mobility Overal bed mobility: Needs Assistance Bed Mobility: Supine to Sit     Supine to sit: Mod assist     General bed mobility comments: increased time, labored movement    Transfers Overall transfer level: Needs assistance Equipment used: Rolling walker (2 wheels) Transfers: Sit to/from Stand, Bed to chair/wheelchair/BSC Sit to Stand: Min assist, Mod assist   Step pivot transfers: Mod assist       General transfer comment: increased time, labored movment, requires frequent verbal/tactile cueing    Ambulation/Gait Ambulation/Gait assistance: Mod assist Gait Distance (Feet): 12 Feet Assistive device: Rolling walker (2 wheels) Gait Pattern/deviations: Decreased step length - left, Decreased stance time - right, Decreased stride length Gait velocity: decreased     General Gait Details: increased endurance/distance for taking steps forward/backwards at bedside using RW, limited mostly due to c/o fatigue   Stairs             Wheelchair Mobility     Tilt Bed    Modified Rankin (Stroke Patients Only)       Balance Overall balance assessment: Needs assistance Sitting-balance support: Feet supported, No upper extremity supported Sitting balance-Leahy Scale: Fair Sitting balance - Comments: seated at EOB   Standing balance support: Reliant on assistive device for balance, During functional activity, Bilateral upper extremity supported Standing balance-Leahy Scale: Poor Standing balance comment: fair/poor using RW  Communication Communication Communication: No apparent difficulties  Cognition Arousal: Alert Behavior  During Therapy: Anxious, Flat affect   PT - Cognitive impairments: History of cognitive impairments                         Following commands: Impaired Following commands impaired: Follows one step commands with increased time    Cueing Cueing Techniques: Verbal cues, Tactile cues  Exercises      General Comments        Pertinent Vitals/Pain Pain Assessment Pain Assessment: No/denies pain    Home Living                          Prior Function            PT Goals (current goals can now be found in the care plan section) Acute Rehab PT Goals Patient Stated Goal: return home PT Goal Formulation: With patient Time For Goal Achievement: 03/12/24 Potential to Achieve Goals: Good Progress towards PT goals: Progressing toward goals    Frequency    Min 3X/week      PT Plan      Co-evaluation PT/OT/SLP Co-Evaluation/Treatment: Yes Reason for Co-Treatment: To address functional/ADL transfers PT goals addressed during session: Mobility/safety with mobility;Balance;Proper use of DME        AM-PAC PT 6 Clicks Mobility   Outcome Measure  Help needed turning from your back to your side while in a flat bed without using bedrails?: A Lot Help needed moving from lying on your back to sitting on the side of a flat bed without using bedrails?: A Lot Help needed moving to and from a bed to a chair (including a wheelchair)?: A Lot Help needed standing up from a chair using your arms (e.g., wheelchair or bedside chair)?: A Lot Help needed to walk in hospital room?: A Lot Help needed climbing 3-5 steps with a railing? : A Lot 6 Click Score: 12    End of Session Equipment Utilized During Treatment: Gait belt Activity Tolerance: Patient tolerated treatment well;Patient limited by fatigue Patient left: in chair;with call bell/phone within reach Nurse Communication: Mobility status PT Visit Diagnosis: Unsteadiness on feet (R26.81);Other abnormalities of  gait and mobility (R26.89);Muscle weakness (generalized) (M62.81)     Time: 1355-1420 PT Time Calculation (min) (ACUTE ONLY): 25 min  Charges:    $Therapeutic Activity: 23-37 mins PT General Charges $$ ACUTE PT VISIT: 1 Visit                     2:29 PM, 02/28/24 Lynwood Music, MPT Physical Therapist with [facility] [phone] office [phone] mobile phone

## 2024-02-29 DIAGNOSIS — Z87891 Personal history of nicotine dependence: Secondary | ICD-10-CM | POA: Diagnosis not present

## 2024-02-29 DIAGNOSIS — G3184 Mild cognitive impairment, so stated: Secondary | ICD-10-CM | POA: Diagnosis not present

## 2024-02-29 DIAGNOSIS — Z7982 Long term (current) use of aspirin: Secondary | ICD-10-CM | POA: Diagnosis not present

## 2024-02-29 DIAGNOSIS — Z87898 Personal history of other specified conditions: Secondary | ICD-10-CM | POA: Diagnosis not present

## 2024-02-29 DIAGNOSIS — I10 Essential (primary) hypertension: Secondary | ICD-10-CM | POA: Diagnosis not present

## 2024-02-29 DIAGNOSIS — R569 Unspecified convulsions: Secondary | ICD-10-CM | POA: Diagnosis not present

## 2024-02-29 DIAGNOSIS — M6281 Muscle weakness (generalized): Secondary | ICD-10-CM | POA: Diagnosis not present

## 2024-02-29 DIAGNOSIS — G459 Transient cerebral ischemic attack, unspecified: Secondary | ICD-10-CM | POA: Diagnosis not present

## 2024-02-29 DIAGNOSIS — S0101XS Laceration without foreign body of scalp, sequela: Secondary | ICD-10-CM | POA: Diagnosis not present

## 2024-02-29 DIAGNOSIS — R2681 Unsteadiness on feet: Secondary | ICD-10-CM | POA: Diagnosis not present

## 2024-02-29 DIAGNOSIS — I119 Hypertensive heart disease without heart failure: Secondary | ICD-10-CM | POA: Diagnosis not present

## 2024-02-29 DIAGNOSIS — Z8669 Personal history of other diseases of the nervous system and sense organs: Secondary | ICD-10-CM | POA: Diagnosis not present

## 2024-02-29 DIAGNOSIS — I609 Nontraumatic subarachnoid hemorrhage, unspecified: Secondary | ICD-10-CM | POA: Diagnosis not present

## 2024-02-29 DIAGNOSIS — S066X0A Traumatic subarachnoid hemorrhage without loss of consciousness, initial encounter: Secondary | ICD-10-CM | POA: Diagnosis not present

## 2024-02-29 DIAGNOSIS — M1990 Unspecified osteoarthritis, unspecified site: Secondary | ICD-10-CM | POA: Diagnosis not present

## 2024-02-29 DIAGNOSIS — R296 Repeated falls: Secondary | ICD-10-CM | POA: Diagnosis not present

## 2024-02-29 DIAGNOSIS — R8281 Pyuria: Secondary | ICD-10-CM | POA: Diagnosis not present

## 2024-02-29 DIAGNOSIS — R55 Syncope and collapse: Secondary | ICD-10-CM | POA: Diagnosis not present

## 2024-02-29 DIAGNOSIS — I959 Hypotension, unspecified: Secondary | ICD-10-CM | POA: Diagnosis not present

## 2024-02-29 DIAGNOSIS — Z9181 History of falling: Secondary | ICD-10-CM | POA: Diagnosis not present

## 2024-02-29 DIAGNOSIS — N390 Urinary tract infection, site not specified: Secondary | ICD-10-CM | POA: Diagnosis not present

## 2024-02-29 DIAGNOSIS — S37009A Unspecified injury of unspecified kidney, initial encounter: Secondary | ICD-10-CM | POA: Diagnosis not present

## 2024-02-29 DIAGNOSIS — Z5320 Procedure and treatment not carried out because of patient's decision for unspecified reasons: Secondary | ICD-10-CM | POA: Diagnosis not present

## 2024-02-29 DIAGNOSIS — N179 Acute kidney failure, unspecified: Secondary | ICD-10-CM | POA: Diagnosis not present

## 2024-02-29 DIAGNOSIS — H548 Legal blindness, as defined in USA: Secondary | ICD-10-CM | POA: Diagnosis not present

## 2024-02-29 DIAGNOSIS — H5410 Blindness, one eye, low vision other eye, unspecified eyes: Secondary | ICD-10-CM | POA: Diagnosis not present

## 2024-02-29 DIAGNOSIS — E785 Hyperlipidemia, unspecified: Secondary | ICD-10-CM | POA: Diagnosis not present

## 2024-02-29 DIAGNOSIS — K5900 Constipation, unspecified: Secondary | ICD-10-CM | POA: Diagnosis not present

## 2024-02-29 DIAGNOSIS — W07XXXS Fall from chair, sequela: Secondary | ICD-10-CM | POA: Diagnosis not present

## 2024-02-29 DIAGNOSIS — S066X0D Traumatic subarachnoid hemorrhage without loss of consciousness, subsequent encounter: Secondary | ICD-10-CM | POA: Diagnosis not present

## 2024-02-29 DIAGNOSIS — H547 Unspecified visual loss: Secondary | ICD-10-CM | POA: Diagnosis not present

## 2024-02-29 DIAGNOSIS — Z91148 Patient's other noncompliance with medication regimen for other reason: Secondary | ICD-10-CM | POA: Diagnosis not present

## 2024-02-29 DIAGNOSIS — N3281 Overactive bladder: Secondary | ICD-10-CM | POA: Diagnosis not present

## 2024-02-29 DIAGNOSIS — R2689 Other abnormalities of gait and mobility: Secondary | ICD-10-CM | POA: Diagnosis not present

## 2024-02-29 DIAGNOSIS — Z299 Encounter for prophylactic measures, unspecified: Secondary | ICD-10-CM | POA: Diagnosis not present

## 2024-02-29 DIAGNOSIS — Z8673 Personal history of transient ischemic attack (TIA), and cerebral infarction without residual deficits: Secondary | ICD-10-CM | POA: Diagnosis not present

## 2024-02-29 DIAGNOSIS — S0101XA Laceration without foreign body of scalp, initial encounter: Secondary | ICD-10-CM | POA: Diagnosis not present

## 2024-02-29 DIAGNOSIS — E569 Vitamin deficiency, unspecified: Secondary | ICD-10-CM | POA: Diagnosis not present

## 2024-02-29 DIAGNOSIS — E559 Vitamin D deficiency, unspecified: Secondary | ICD-10-CM | POA: Diagnosis not present

## 2024-02-29 DIAGNOSIS — E782 Mixed hyperlipidemia: Secondary | ICD-10-CM | POA: Diagnosis not present

## 2024-02-29 DIAGNOSIS — N3000 Acute cystitis without hematuria: Secondary | ICD-10-CM | POA: Diagnosis not present

## 2024-02-29 DIAGNOSIS — E538 Deficiency of other specified B group vitamins: Secondary | ICD-10-CM | POA: Diagnosis not present

## 2024-02-29 DIAGNOSIS — R262 Difficulty in walking, not elsewhere classified: Secondary | ICD-10-CM | POA: Diagnosis not present

## 2024-02-29 DIAGNOSIS — G20C Parkinsonism, unspecified: Secondary | ICD-10-CM | POA: Diagnosis not present

## 2024-02-29 LAB — LEVETIRACETAM LEVEL: Levetiracetam Lvl: 26.9 ug/mL (ref 10.0–40.0)

## 2024-02-29 MED ORDER — LEVETIRACETAM 250 MG PO TABS
250.0000 mg | ORAL_TABLET | Freq: Two times a day (BID) | ORAL | Status: DC
  Administered 2024-02-29: 250 mg via ORAL
  Filled 2024-02-29: qty 1

## 2024-02-29 MED ORDER — CEFUROXIME AXETIL 250 MG PO TABS
500.0000 mg | ORAL_TABLET | Freq: Two times a day (BID) | ORAL | Status: DC
  Administered 2024-02-29: 500 mg via ORAL
  Filled 2024-02-29: qty 2

## 2024-02-29 NOTE — Progress Notes (Signed)
 Patient discharged by prior hospitalist 9/9, though DC was delayed to monitor level of agitation. The patient's night was uneventful and she has no complaints this morning. She has not had any restraints > 24 hours. Remains afebrile without suprapubic tenderness. Plan to continue home medications and convert to po antibiotic. The discharge summary authored 9/9 remains accurate and she remains stable for discharge today.   Bernardino Come, MD 02/29/2024 11:32 AM

## 2024-02-29 NOTE — TOC Transition Note (Signed)
 Transition of Care [facility]) - Discharge Note   Patient Details  Name: Anna Jacobs MRN: [id number] Date of Birth: 1941/03/10  Transition of Care [facility]) CM/SW Contact:  Mcarthur Saddie Kim, LCSW Phone Number: 02/29/2024, 11:37 AM   Clinical Narrative:  [facility] reports since pt only required mitts and has been 24 hours physical/chemical restraint free they will take pt today. Pt's daughter notified and will transport. SNF indicates auth received. D/C summary sent to SNF. RN given number to call report.      Final next level of care: Skilled Nursing Facility Barriers to Discharge: Barriers Resolved   Patient Goals and CMS Choice Patient states their goals for this hospitalization and ongoing recovery are:: For pt to return to [facility] following rehab CMS Medicare.gov Compare Post Acute Care list provided to:: Patient Represenative (must comment) Choice offered to / list presented to : Adult Children [place] ownership interest in [facility].provided to:: Adult Children    Discharge Placement              Patient chooses bed at: [facility] Patient to be transferred to facility by: daughter Name of family member notified: daughter Patient and family notified of of transfer: 02/29/24  Discharge Plan and Services Additional resources added to the After Visit Summary for   In-house Referral: Clinical Social Work Discharge Planning Services: NA Post Acute Care Choice: Skilled Nursing Facility          DME Arranged: N/A DME Agency: NA                  Social Drivers of Health (SDOH) Interventions SDOH Screenings   Food Insecurity: No Food Insecurity (10/12/2023)   Received from [facility]  Transportation Needs: No Transportation Needs (10/12/2023)   Received from [facility]  Utilities: Low Risk  (10/12/2023)   Received from [facility]  Financial Resource Strain: Low Risk  (10/12/2023)   Received from [facility]  Tobacco Use: Medium Risk (02/25/2024)     Readmission Risk Interventions     No data to display

## 2024-02-29 NOTE — TOC Progression Note (Signed)
 Transition of Care [facility]) - Progression Note    Patient Details  Name: Anna Jacobs MRN: [id number] Date of Birth: July 31, 1940  [facility]) CM/SW Contact  Mcarthur Saddie Kim, [place] Phone Number: 02/29/2024, 7:59 AM  Clinical Narrative: LCSW confirmed with RN no physical/chemical restraints since yesterday morning. LCSW updated [facility] will plan on d/c tomorrow as pt will need to be physical/chemical restraint free for 48 hours prior to d/c to SNF.       Expected Discharge Plan: Skilled Nursing Facility Barriers to Discharge: Requiring sitter/restraints               Expected Discharge Plan and Services In-house Referral: Clinical Social Work Discharge Planning Services: NA Post Acute Care Choice: Skilled Nursing Facility Living arrangements for the past 2 months: Assisted Living Facility Expected Discharge Date: 02/28/24               DME Arranged: N/A DME Agency: NA                   Social Drivers of Health (SDOH) Interventions SDOH Screenings   Food Insecurity: No Food Insecurity (10/12/2023)   Received from [facility]  Transportation Needs: No Transportation Needs (10/12/2023)   Received from [facility]  Utilities: Low Risk  (10/12/2023)   Received from [facility]  Financial Resource Strain: Low Risk  (10/12/2023)   Received from [facility]  Tobacco Use: Medium Risk (02/25/2024)    Readmission Risk Interventions     No data to display

## 2024-02-29 NOTE — Progress Notes (Signed)
 Mobility Specialist Progress Note:    02/29/24 0940  Mobility  Activity Pivoted/transferred to/from [facility]  Level of Assistance Moderate assist, patient does 50-74%  Assistive Device None  Distance Ambulated (ft) 2 ft  Range of Motion/Exercises Active;All extremities  Activity Response Tolerated well  Mobility Referral Yes  Mobility visit 1 Mobility  Mobility Specialist Start Time (ACUTE ONLY) 0940  Mobility Specialist Stop Time (ACUTE ONLY) 0955  Mobility Specialist Time Calculation (min) (ACUTE ONLY) 15 min   Pt received in bed, assisting NT transfer to [facility]. Required ModA to stand and pivot with no AD. Tolerated well,asx throughout. NT in room, all needs met.  Hiran Leard Mobility Specialist Please contact via Special educational needs teacher or  Rehab office at [phone]

## 2024-03-01 DIAGNOSIS — I609 Nontraumatic subarachnoid hemorrhage, unspecified: Secondary | ICD-10-CM | POA: Diagnosis not present

## 2024-03-01 DIAGNOSIS — S0101XA Laceration without foreign body of scalp, initial encounter: Secondary | ICD-10-CM | POA: Diagnosis not present

## 2024-03-01 DIAGNOSIS — R569 Unspecified convulsions: Secondary | ICD-10-CM | POA: Diagnosis not present

## 2024-03-01 DIAGNOSIS — N3000 Acute cystitis without hematuria: Secondary | ICD-10-CM | POA: Diagnosis not present

## 2024-03-01 DIAGNOSIS — E559 Vitamin D deficiency, unspecified: Secondary | ICD-10-CM | POA: Diagnosis not present

## 2024-03-01 DIAGNOSIS — Z9181 History of falling: Secondary | ICD-10-CM | POA: Diagnosis not present

## 2024-03-01 DIAGNOSIS — E538 Deficiency of other specified B group vitamins: Secondary | ICD-10-CM | POA: Diagnosis not present

## 2024-03-01 DIAGNOSIS — I959 Hypotension, unspecified: Secondary | ICD-10-CM | POA: Diagnosis not present

## 2024-03-01 DIAGNOSIS — H5410 Blindness, one eye, low vision other eye, unspecified eyes: Secondary | ICD-10-CM | POA: Diagnosis not present

## 2024-03-01 DIAGNOSIS — E782 Mixed hyperlipidemia: Secondary | ICD-10-CM | POA: Diagnosis not present

## 2024-03-02 DIAGNOSIS — Z91148 Patient's other noncompliance with medication regimen for other reason: Secondary | ICD-10-CM | POA: Diagnosis not present

## 2024-03-03 DIAGNOSIS — Z91148 Patient's other noncompliance with medication regimen for other reason: Secondary | ICD-10-CM | POA: Diagnosis not present

## 2024-03-04 DIAGNOSIS — R569 Unspecified convulsions: Secondary | ICD-10-CM | POA: Diagnosis not present

## 2024-03-05 DIAGNOSIS — R569 Unspecified convulsions: Secondary | ICD-10-CM | POA: Diagnosis not present

## 2024-03-05 DIAGNOSIS — Z5320 Procedure and treatment not carried out because of patient's decision for unspecified reasons: Secondary | ICD-10-CM | POA: Diagnosis not present

## 2024-03-06 DIAGNOSIS — I609 Nontraumatic subarachnoid hemorrhage, unspecified: Secondary | ICD-10-CM | POA: Diagnosis not present

## 2024-03-06 DIAGNOSIS — Z87898 Personal history of other specified conditions: Secondary | ICD-10-CM | POA: Diagnosis not present

## 2024-03-06 DIAGNOSIS — I10 Essential (primary) hypertension: Secondary | ICD-10-CM | POA: Diagnosis not present

## 2024-03-06 DIAGNOSIS — S37009A Unspecified injury of unspecified kidney, initial encounter: Secondary | ICD-10-CM | POA: Diagnosis not present

## 2024-03-07 DIAGNOSIS — Z91148 Patient's other noncompliance with medication regimen for other reason: Secondary | ICD-10-CM | POA: Diagnosis not present

## 2024-03-09 DIAGNOSIS — Z5320 Procedure and treatment not carried out because of patient's decision for unspecified reasons: Secondary | ICD-10-CM | POA: Diagnosis not present

## 2024-03-09 DIAGNOSIS — I119 Hypertensive heart disease without heart failure: Secondary | ICD-10-CM | POA: Diagnosis not present

## 2024-03-12 DIAGNOSIS — R296 Repeated falls: Secondary | ICD-10-CM | POA: Diagnosis not present

## 2024-03-14 DIAGNOSIS — R296 Repeated falls: Secondary | ICD-10-CM | POA: Diagnosis not present

## 2024-03-16 DIAGNOSIS — R451 Restlessness and agitation: Secondary | ICD-10-CM | POA: Diagnosis not present

## 2024-03-19 DIAGNOSIS — R569 Unspecified convulsions: Secondary | ICD-10-CM | POA: Diagnosis not present

## 2024-03-20 DIAGNOSIS — R262 Difficulty in walking, not elsewhere classified: Secondary | ICD-10-CM | POA: Diagnosis not present

## 2024-03-22 DIAGNOSIS — S40211A Abrasion of right shoulder, initial encounter: Secondary | ICD-10-CM | POA: Diagnosis not present

## 2024-03-23 ENCOUNTER — Ambulatory Visit: Admitting: Urology

## 2024-04-04 DIAGNOSIS — K5900 Constipation, unspecified: Secondary | ICD-10-CM | POA: Diagnosis not present

## 2024-04-05 DIAGNOSIS — S066X0D Traumatic subarachnoid hemorrhage without loss of consciousness, subsequent encounter: Secondary | ICD-10-CM | POA: Diagnosis not present

## 2024-04-05 DIAGNOSIS — H548 Legal blindness, as defined in USA: Secondary | ICD-10-CM | POA: Diagnosis not present

## 2024-04-05 DIAGNOSIS — M6281 Muscle weakness (generalized): Secondary | ICD-10-CM | POA: Diagnosis not present

## 2024-04-05 DIAGNOSIS — E569 Vitamin deficiency, unspecified: Secondary | ICD-10-CM | POA: Diagnosis not present

## 2024-04-06 DIAGNOSIS — H547 Unspecified visual loss: Secondary | ICD-10-CM | POA: Diagnosis not present

## 2024-04-06 DIAGNOSIS — R569 Unspecified convulsions: Secondary | ICD-10-CM | POA: Diagnosis not present

## 2024-04-09 DIAGNOSIS — L98491 Non-pressure chronic ulcer of skin of other sites limited to breakdown of skin: Secondary | ICD-10-CM | POA: Diagnosis not present

## 2024-04-12 DIAGNOSIS — M6281 Muscle weakness (generalized): Secondary | ICD-10-CM | POA: Diagnosis not present

## 2024-04-12 DIAGNOSIS — S066X0D Traumatic subarachnoid hemorrhage without loss of consciousness, subsequent encounter: Secondary | ICD-10-CM | POA: Diagnosis not present

## 2024-04-12 DIAGNOSIS — E569 Vitamin deficiency, unspecified: Secondary | ICD-10-CM | POA: Diagnosis not present

## 2024-04-12 DIAGNOSIS — H548 Legal blindness, as defined in USA: Secondary | ICD-10-CM | POA: Diagnosis not present
